# Patient Record
Sex: Female | Born: 1950 | Race: White | Hispanic: No | Smoking: Never smoker
Health system: Southern US, Community
[De-identification: ages and names within clinical notes are randomized; demographics above are authoritative.]

## PROBLEM LIST (undated history)

## (undated) DIAGNOSIS — T7840XA Allergy, unspecified, initial encounter: Secondary | ICD-10-CM

## (undated) DIAGNOSIS — K5792 Diverticulitis of intestine, part unspecified, without perforation or abscess without bleeding: Secondary | ICD-10-CM

## (undated) DIAGNOSIS — M1990 Unspecified osteoarthritis, unspecified site: Secondary | ICD-10-CM

## (undated) HISTORY — PX: NASAL SINUS SURGERY: SHX719

## (undated) HISTORY — PX: UPPER GASTROINTESTINAL ENDOSCOPY: SHX188

## (undated) HISTORY — DX: Diverticulitis of intestine, part unspecified, without perforation or abscess without bleeding: K57.92

## (undated) HISTORY — PX: COLONOSCOPY: SHX5424

## (undated) HISTORY — DX: Unspecified osteoarthritis, unspecified site: M19.90

## (undated) HISTORY — DX: Allergy, unspecified, initial encounter: T78.40XA

## (undated) HISTORY — PX: CHOLECYSTECTOMY: SHX55

---

## 1999-06-16 ENCOUNTER — Other Ambulatory Visit: Admission: RE | Admit: 1999-06-16 | Discharge: 1999-06-16 | Payer: Self-pay | Admitting: *Deleted

## 1999-06-19 ENCOUNTER — Other Ambulatory Visit: Admission: RE | Admit: 1999-06-19 | Discharge: 1999-06-19 | Payer: Self-pay | Admitting: *Deleted

## 1999-07-18 ENCOUNTER — Encounter: Admission: RE | Admit: 1999-07-18 | Discharge: 1999-07-18 | Payer: Self-pay | Admitting: *Deleted

## 2012-02-29 DIAGNOSIS — C4491 Basal cell carcinoma of skin, unspecified: Secondary | ICD-10-CM

## 2012-02-29 HISTORY — DX: Basal cell carcinoma of skin, unspecified: C44.91

## 2013-03-13 DIAGNOSIS — IMO0002 Reserved for concepts with insufficient information to code with codable children: Secondary | ICD-10-CM | POA: Insufficient documentation

## 2013-03-13 DIAGNOSIS — N952 Postmenopausal atrophic vaginitis: Secondary | ICD-10-CM

## 2013-03-13 HISTORY — DX: Postmenopausal atrophic vaginitis: N95.2

## 2015-10-29 DIAGNOSIS — K219 Gastro-esophageal reflux disease without esophagitis: Secondary | ICD-10-CM | POA: Insufficient documentation

## 2016-11-12 ENCOUNTER — Encounter: Payer: Self-pay | Admitting: Family Medicine

## 2017-11-16 DIAGNOSIS — F5101 Primary insomnia: Secondary | ICD-10-CM

## 2017-11-16 HISTORY — DX: Primary insomnia: F51.01

## 2018-01-17 ENCOUNTER — Encounter: Payer: Self-pay | Admitting: Family Medicine

## 2018-04-13 DIAGNOSIS — F411 Generalized anxiety disorder: Secondary | ICD-10-CM | POA: Insufficient documentation

## 2018-04-13 DIAGNOSIS — E782 Mixed hyperlipidemia: Secondary | ICD-10-CM | POA: Insufficient documentation

## 2018-11-22 LAB — HEPATIC FUNCTION PANEL
ALT: 29 (ref 7–35)
AST: 28 (ref 13–35)
Alkaline Phosphatase: 81 (ref 25–125)
Bilirubin, Total: 0.6

## 2018-11-22 LAB — LIPID PANEL
Cholesterol: 205 — AB (ref 0–200)
HDL: 74 — AB (ref 35–70)
LDL Cholesterol: 98
Triglycerides: 166 — AB (ref 40–160)

## 2018-11-22 LAB — CBC AND DIFFERENTIAL
HCT: 42 (ref 36–46)
Hemoglobin: 14.7 (ref 12.0–16.0)
Neutrophils Absolute: 5
Platelets: 309 (ref 150–399)
WBC: 7.5

## 2018-11-22 LAB — HEMOGLOBIN A1C: Hemoglobin A1C: 5.6

## 2018-11-22 LAB — BASIC METABOLIC PANEL
BUN: 15 (ref 4–21)
Creatinine: 0.6 (ref 0.5–1.1)
Glucose: 98
Potassium: 4.2 (ref 3.4–5.3)
Sodium: 140 (ref 137–147)

## 2019-03-29 ENCOUNTER — Other Ambulatory Visit: Payer: Self-pay

## 2019-03-29 ENCOUNTER — Ambulatory Visit (INDEPENDENT_AMBULATORY_CARE_PROVIDER_SITE_OTHER): Payer: Medicare Other | Admitting: Family Medicine

## 2019-03-29 ENCOUNTER — Encounter: Payer: Self-pay | Admitting: Family Medicine

## 2019-03-29 VITALS — BP 118/68 | HR 66 | Temp 98.4°F | Resp 14 | Ht 65.0 in | Wt 134.4 lb

## 2019-03-29 DIAGNOSIS — H04123 Dry eye syndrome of bilateral lacrimal glands: Secondary | ICD-10-CM

## 2019-03-29 DIAGNOSIS — I10 Essential (primary) hypertension: Secondary | ICD-10-CM | POA: Insufficient documentation

## 2019-03-29 DIAGNOSIS — J4520 Mild intermittent asthma, uncomplicated: Secondary | ICD-10-CM

## 2019-03-29 DIAGNOSIS — H04129 Dry eye syndrome of unspecified lacrimal gland: Secondary | ICD-10-CM | POA: Insufficient documentation

## 2019-03-29 DIAGNOSIS — B353 Tinea pedis: Secondary | ICD-10-CM

## 2019-03-29 DIAGNOSIS — J301 Allergic rhinitis due to pollen: Secondary | ICD-10-CM

## 2019-03-29 DIAGNOSIS — Z8249 Family history of ischemic heart disease and other diseases of the circulatory system: Secondary | ICD-10-CM | POA: Diagnosis not present

## 2019-03-29 DIAGNOSIS — L301 Dyshidrosis [pompholyx]: Secondary | ICD-10-CM

## 2019-03-29 DIAGNOSIS — K219 Gastro-esophageal reflux disease without esophagitis: Secondary | ICD-10-CM

## 2019-03-29 HISTORY — DX: Gastro-esophageal reflux disease without esophagitis: K21.9

## 2019-03-29 HISTORY — DX: Allergic rhinitis due to pollen: J30.1

## 2019-03-29 HISTORY — DX: Essential (primary) hypertension: I10

## 2019-03-29 HISTORY — DX: Mild intermittent asthma, uncomplicated: J45.20

## 2019-03-29 MED ORDER — TRIAMCINOLONE ACETONIDE 0.1 % EX CREA: 1.0000 "application " | TOPICAL_CREAM | Freq: Two times a day (BID) | CUTANEOUS | 0 refills | Status: DC

## 2019-03-29 NOTE — Patient Instructions (Signed)
Please return in February 2021 for your annual complete physical; please come fasting.  Please have your GYN send me the results of your upcoming mammo and Bone Density tests.   Try the steroid cream twice a day for your hands. Use OTC Lamisil for your feet.   It was a pleasure meeting you today! Thank you for choosing [place] to meet your healthcare needs! I truly look forward to working with you. If you have any questions or concerns, please send me a message via Mychart or call the office at [phone].   Calcium Intake Recommendations You can take Caltrate Plus twice a day or get it through your diet or other OTC supplements (Viactiv, OsCal etc)  Calcium is a mineral that affects many functions in the body, including:  Blood clotting.  Blood vessel function.  Nerve impulse conduction.  Hormone secretion.  Muscle contraction.  Bone and teeth functions.  Most of your body's calcium supply is stored in your bones and teeth. When your calcium stores are low, you may be at risk for low bone mass, bone loss, and bone fractures. Consuming enough calcium helps to grow healthy bones and teeth and to prevent breakdown over time. It is very important that you get enough calcium if you are:  A child undergoing rapid growth.  An adolescent girl.  A pre- or post-menopausal woman.  A woman whose menstrual cycle has stopped due to anorexia nervosa or regular intense exercise.  An individual with lactose intolerance or a milk allergy.  A vegetarian.  What is my plan? Try to consume the recommended amount of calcium daily based on your age. Depending on your overall health, your health care provider may recommend increased calcium intake.General daily calcium intake recommendations by age are:  Birth to 6 months: 200 mg.  Infants 7 to 12 months: 260 mg.  Children 1 to 3 years: 700 mg.  Children 4 to 8 years: 1,000 mg.  Children 9 to 13 years: 1,300 mg.  Teens 14 to 18 years:  1,300 mg.  Adults 19 to 50 years: 1,000 mg.  Adult women 51 to 70 years: 1,200 mg.  Adult men 51 to 70 years: 1,000 mg.  Adults 71 years and older: 1,200 mg.  Pregnant and breastfeeding teens: 1,300 mg.  Pregnant and breastfeeding adults: 1,000 mg.  What do I need to know about calcium intake?  In order for the body to absorb calcium, it needs vitamin D. You can get vitamin D through (we recommend getting [phone] units of Vitamin D daily) ? Direct exposure of the skin to sunlight. ? Foods, such as egg yolks, liver, saltwater fish, and fortified milk. ? Supplements.  Consuming too much calcium may cause: ? Constipation. ? Decreased absorption of iron and zinc. ? Kidney stones.  Calcium supplements may interact with certain medicines. Check with your health care provider before starting any calcium supplements.  Try to get most of your calcium from food. What foods can I eat? Grains  Fortified oatmeal. Fortified ready-to-eat cereals. Fortified frozen waffles. Vegetables Turnip greens. Broccoli. Fruits Fortified orange juice. Meats and Other Protein Sources Canned sardines with bones. Canned salmon with bones. Soy beans. Tofu. Baked beans. Almonds. [place] nuts. Sunflower seeds. Dairy Milk. Yogurt. Cheese. Cottage cheese. Beverages Fortified soy milk. Fortified rice milk. Sweets/Desserts Pudding. Ice Cream. Milkshakes. Blackstrap molasses. The items listed above may not be a complete list of recommended foods or beverages. Contact your dietitian for more options. What foods can affect my calcium intake?  It may be more difficult for your body to use calcium or calcium may leave your body more quickly if you consume large amounts of:  Sodium.  Protein.  Caffeine.  Alcohol.  This information is not intended to replace advice given to you by your health care provider. Make sure you discuss any questions you have with your health care provider. Document Released:  04/28/2004 Document Revised: 04/03/2016 Document Reviewed: 02/20/2014 Elsevier Interactive Patient Education  2018 [employer].

## 2019-03-29 NOTE — Progress Notes (Signed)
Subjective  CC:  Chief Complaint  Patient presents with  . Establish Care    Recent move to the area, previous PCP was Dr. Leonides Schanz had CPE 10/2018  . Recurrent Skin Infections    Left foot.. Noticed in March gotten worse over last month. Has not tried anything    HPI: Tina Kelley is a 68 y.o. female who presents to [facility] today to establish care with me as a new patient.   She has the following concerns or needs:  Very pleasant 68 year old married female recently relocated to [place] from [place].  She is a retired Radio producer.  Her husband is retiring as well and he moved to be closer to their children.  They live in [place] in the past.  Overall, she is very healthy.  She has mild hypertension is well controlled on low-dose medications.  Mild intermittent asthma that is uncomplicated, seasonal allergies and GERD symptoms.  She had a recent physical in February and reports her blood work was normal at that time.  She lives a healthy lifestyle.  Overall she feels well.  Immunizations are up-to-date.  Health maintenance: She is due for mammogram and bone density.  She does have history of osteopenia by chart review.  She gets this done at her gynecologist and will be seeing her in July.  Colonoscopy was normal when last done.  It is up-to-date.  Rashes: She reports that she has some peeling on her hands and left fourth and fifth toe.  Toes have mild redness and itching.  She did get pedicures in the past.  No toenail problems.  Hands with mild redness and flaking worse after frequent handwashing due to the cold epidemic.  Assessment  1. Family history of premature CAD   2. Essential hypertension   3. Seasonal allergic rhinitis due to pollen   4. Mild intermittent asthma without complication   5. Dry eye syndrome of both eyes   6. Gastroesophageal reflux disease without esophagitis   7. Dyshidrotic eczema   8. Tinea pedis,  unspecified laterality      Plan   Multiple chronic problems are mild and well-controlled.  No changes in medications made today for those.  Dyshidrotic eczema and presumed tinea pedis: Educated on diagnosis and treatment options.  Triamcinolone for hands and Lamisil over-the-counter for feet.  Follow-up if not improving.  Health maintenance: She will get her mammogram and bone density at her gynecologist, I have requested results.  Continue calcium and vitamin D and active lifestyle.  Return in 7 months for complete physical.  Hypertension is well controlled.  Follow up:  Return in about 7 months (around 10/30/2019) for complete physical. No orders of the defined types were placed in this encounter.  Meds ordered this encounter  Medications  . triamcinolone cream (KENALOG) 0.1 %    Sig: Apply 1 application topically 2 (two) times daily. For 2 weeks, then as needed    Dispense:  28.4 g    Refill:  0     Depression screen PHQ 2/9 03/29/2019  Decreased Interest 1  Down, Depressed, Hopeless 1  PHQ - 2 Score 2  Altered sleeping 0  Tired, decreased energy 1  Change in appetite 0  Feeling bad or failure about yourself  0  Trouble concentrating 0  Moving slowly or fidgety/restless 0  Suicidal thoughts 0  PHQ-9 Score 3  Difficult doing work/chores Not difficult at all    We updated and  reviewed the patient's past history in detail and it is documented below.  Patient Active Problem List   Diagnosis Date Noted  . Essential hypertension 03/29/2019  . Seasonal allergic rhinitis due to pollen 03/29/2019  . Mild intermittent asthma without complication 82/50/5397  . Dry eye syndrome 03/29/2019  . GERD (gastroesophageal reflux disease) 03/29/2019    Chronic, normal EGD. Chronic PPI   . Family history of premature CAD 03/29/2019  . GAD (generalized anxiety disorder) 04/13/2018  . Mixed hyperlipidemia 04/13/2018  . Primary insomnia 11/16/2017  . Postmenopausal atrophic vaginitis  03/13/2013  . Dyspareunia 03/13/2013  . Basal cell carcinoma of skin 02/29/2012    basal cell carcinoma on her left melolabial fold approximately 30 years  ago    Health Maintenance  Topic Date Due  . Hepatitis C Screening  09-04-1951  . MAMMOGRAM  10/13/1967  . TETANUS/TDAP  10/12/1968  . COLONOSCOPY  10/13/1999  . PNA vac Low Risk Adult (1 of 2 - PCV13) 10/12/2014  . DEXA SCAN  02/27/2019  . INFLUENZA VACCINE  04/29/2019   Immunization History  Administered Date(s) Administered  . Pneumococcal Conjugate-13 10/24/2014  . Pneumococcal Polysaccharide-23 10/29/2015  . Tdap 01/08/2011   Current Meds  Medication Sig  . albuterol (VENTOLIN HFA) 108 (90 Base) MCG/ACT inhaler   . buPROPion (WELLBUTRIN SR) 150 MG 12 hr tablet   . conjugated estrogens (PREMARIN) vaginal cream Place vaginally.  . cycloSPORINE, PF, (CEQUA) 0.09 % SOLN Apply to eye. Both eye BID  . esomeprazole (NEXIUM) 20 MG capsule Take 20 mg by mouth daily at 12 noon.  . fluticasone (FLONASE) 50 MCG/ACT nasal spray Place into the nose.  . Lactobacillus Rhamnosus, GG, (CULTURELLE) CAPS Take by mouth.  . losartan (COZAAR) 50 MG tablet 50 mg.   . metoprolol tartrate (LOPRESSOR) 50 MG tablet Take by mouth.  . prednisoLONE Acetate-Nepafenac 1-0.1 % SUSP Apply to eye. Both eyes  . PRESCRIPTION MEDICATION Cholestyramine for Oral Suspension  . rosuvastatin (CRESTOR) 10 MG tablet Take by mouth.  . zolpidem (AMBIEN CR) 12.5 MG CR tablet Take by mouth.    Allergies: Patient is allergic to aspirin; nsaids; penicillins; and lisinopril. Past Medical History Patient  has a past medical history of Basal cell carcinoma of skin (02/29/2012), Essential hypertension (03/29/2019), GERD (gastroesophageal reflux disease) (03/29/2019), Mild intermittent asthma without complication (03/04/3418), Postmenopausal atrophic vaginitis (03/13/2013), Primary insomnia (11/16/2017), and Seasonal allergic rhinitis due to pollen (03/29/2019). Past Surgical  History Patient  has no past surgical history on file. Family History: Patient family history includes Healthy in her son and son; Heart disease in her father; Lung cancer in her mother. Social History:  Patient  reports that she has never smoked. She has never used smokeless tobacco. She reports current alcohol use. She reports that she does not use drugs.  Review of Systems: Constitutional: negative for fever or malaise Ophthalmic: negative for photophobia, double vision or loss of vision Cardiovascular: negative for chest pain, dyspnea on exertion, or new LE swelling Respiratory: negative for SOB or persistent cough Gastrointestinal: negative for abdominal pain, change in bowel habits or melena Genitourinary: negative for dysuria or gross hematuria Musculoskeletal: negative for new gait disturbance or muscular weakness Integumentary: negative for new or persistent rashes Neurological: negative for TIA or stroke symptoms Psychiatric: negative for SI or delusions Allergic/Immunologic: negative for hives  Patient Care Team    Relationship Specialty Notifications Start End  Leamon Arnt, MD PCP - General Family Medicine  03/29/19   Lenon Oms, MD  Consulting Physician Obstetrics and Gynecology  03/29/19     Objective  Vitals: BP 118/68   Pulse 66   Temp 98.4 F (36.9 C) (Oral)   Resp 14   Ht 5\' 5"  (1.651 m)   Wt 134 lb 6.4 oz (61 kg)   SpO2 97%   BMI 22.37 kg/m  General:  Well developed, well nourished, no acute distress  Psych:  Alert and oriented,normal mood and affect HEENT:  Normocephalic, atraumatic, non-icteric sclera, PERRL, oropharynx is without mass or exudate, supple neck without adenopathy, mass or thyromegaly Cardiovascular:  RRR without gallop, rub or murmur, nondisplaced PMI Respiratory:  Good breath sounds bilaterally, CTAB with normal respiratory effort Gastrointestinal: normal bowel sounds, soft, non-tender, no noted masses. No HSM MSK: no deformities,  contusions. Joints are without erythema or swelling Skin:  Warm, left fourth and fifth webspaces of her toes with flaking rash without erythema, hands with mild erythema, flaking and papules. Neurologic:    Mental status is normal. Gross motor and sensory exams are normal. Normal gait   Commons side effects, risks, benefits, and alternatives for medications and treatment plan prescribed today were discussed, and the patient expressed understanding of the given instructions. Patient is instructed to call or message via MyChart if he/she has any questions or concerns regarding our treatment plan. No barriers to understanding were identified. We discussed Red Flag symptoms and signs in detail. Patient expressed understanding regarding what to do in case of urgent or emergency type symptoms.   Medication list was reconciled, printed and provided to the patient in AVS. Patient instructions and summary information was reviewed with the patient as documented in the AVS. This note was prepared with assistance of Dragon voice recognition software. Occasional wrong-word or sound-a-like substitutions may have occurred due to the inherent limitations of voice recognition software

## 2019-04-07 ENCOUNTER — Encounter: Payer: Self-pay | Admitting: *Deleted

## 2019-04-14 ENCOUNTER — Encounter: Payer: Self-pay | Admitting: Family Medicine

## 2019-05-04 ENCOUNTER — Ambulatory Visit (INDEPENDENT_AMBULATORY_CARE_PROVIDER_SITE_OTHER): Payer: Medicare Other

## 2019-05-04 ENCOUNTER — Ambulatory Visit (INDEPENDENT_AMBULATORY_CARE_PROVIDER_SITE_OTHER): Payer: Medicare Other | Admitting: Podiatry

## 2019-05-04 ENCOUNTER — Other Ambulatory Visit: Payer: Self-pay

## 2019-05-04 DIAGNOSIS — M2011 Hallux valgus (acquired), right foot: Secondary | ICD-10-CM

## 2019-05-04 DIAGNOSIS — M21621 Bunionette of right foot: Secondary | ICD-10-CM

## 2019-05-04 NOTE — Patient Instructions (Signed)
Bunion  A bunion is a bump on the base of the big toe that forms when the bones of the big toe joint move out of position. Bunions may be small at first, but they often get larger over time. They can make walking painful. What are the causes? A bunion may be caused by:  Wearing narrow or pointed shoes that force the big toe to press against the other toes.  Abnormal foot development that causes the foot to roll inward (pronate).  Changes in the foot that are caused by certain diseases, such as rheumatoid arthritis or polio.  A foot injury. What increases the risk? The following factors may make you more likely to develop this condition:  Wearing shoes that squeeze the toes together.  Having certain diseases, such as: ? Rheumatoid arthritis. ? Polio. ? Cerebral palsy.  Having family members who have bunions.  Being born with a foot deformity, such as flat feet or low arches.  Doing activities that put a lot of pressure on the feet, such as ballet dancing. What are the signs or symptoms? The main symptom of a bunion is a noticeable bump on the big toe. Other symptoms may include:  Pain.  Swelling around the big toe.  Redness and inflammation.  Thick or hardened skin on the big toe or between the toes.  Stiffness or loss of motion in the big toe.  Trouble with walking. How is this diagnosed? A bunion may be diagnosed based on your symptoms, medical history, and activities. You may have tests, such as:  X-rays. These allow your health care provider to check the position of the bones in your foot and look for damage to your joint. They also help your health care provider determine the severity of your bunion and the best way to treat it.  Joint aspiration. In this test, a sample of fluid is removed from the toe joint. This test may be done if you are in a lot of pain. It helps rule out diseases that cause painful swelling of the joints, such as arthritis. How is this  treated? Treatment depends on the severity of your symptoms. The goal of treatment is to relieve symptoms and prevent the bunion from getting worse. Your health care provider may recommend:  Wearing shoes that have a wide toe box.  Using bunion pads to cushion the affected area.  Taping your toes together to keep them in a normal position.  Placing a device inside your shoe (orthotics) to help reduce pressure on your toe joint.  Taking medicine to ease pain, inflammation, and swelling.  Applying heat or ice to the affected area.  Doing stretching exercises.  Surgery to remove scar tissue and move the toes back into their normal position. This treatment is rare. Follow these instructions at home: Managing pain, stiffness, and swelling   If directed, put ice on the painful area: ? Put ice in a plastic bag. ? Place a towel between your skin and the bag. ? Leave the ice on for 20 minutes, 2-3 times a day. Activity   If directed, apply heat to the affected area before you exercise. Use the heat source that your health care provider recommends, such as a moist heat pack or a heating pad. ? Place a towel between your skin and the heat source. ? Leave the heat on for 20-30 minutes. ? Remove the heat if your skin turns bright red. This is especially important if you are unable to feel pain,   heat, or cold. You may have a greater risk of getting burned.  Do exercises as told by your health care provider. General instructions  Support your toe joint with proper footwear, shoe padding, or taping as told by your health care provider.  Take over-the-counter and prescription medicines only as told by your health care provider.  Keep all follow-up visits as told by your health care provider. This is important. Contact a health care provider if your symptoms:  Get worse.  Do not improve in 2 weeks. Get help right away if you have:  Severe pain and trouble with walking. Summary  A  bunion is a bump on the base of the big toe that forms when the bones of the big toe joint move out of position.  Bunions can make walking painful.  Treatment depends on the severity of your symptoms.  Support your toe joint with proper footwear, shoe padding, or taping as told by your health care provider. This information is not intended to replace advice given to you by your health care provider. Make sure you discuss any questions you have with your health care provider. Document Released: 09/14/2005 Document Revised: 03/21/2018 Document Reviewed: 01/25/2018 Elsevier Patient Education  2020 [employer]  

## 2019-05-11 ENCOUNTER — Encounter: Payer: Self-pay | Admitting: Family Medicine

## 2019-05-15 NOTE — Progress Notes (Signed)
Subjective:   Patient ID: Tina Kelley, female   DOB: 68 y.o.   MRN: [id number]   HPI 68 year old female presents the office today for concerns of a bunion on the right foot.  She said that she had seen another doctor for this previously about 2 years ago however she did move to [place].  She started developed calluses underneath the bunion areas.  They do hurt with shoes.  She has tried changing shoes as well as wearing shoes with arch support as she is a history of plantar fasciitis about 15 years ago.  She denies any recent injury to her feet.   Review of Systems  All other systems reviewed and are negative.  Past Medical History:  Diagnosis Date  . Basal cell carcinoma of skin 02/29/2012   basal cell carcinoma on her left melolabial fold approximately 30 years  ago  . Essential hypertension 03/29/2019  . GERD (gastroesophageal reflux disease) 03/29/2019   Chronic, normal EGD. Chronic PPI  . Mild intermittent asthma without complication 05/31/2354  . Postmenopausal atrophic vaginitis 03/13/2013  . Primary insomnia 11/16/2017  . Seasonal allergic rhinitis due to pollen 03/29/2019    No past surgical history on file.   Current Outpatient Medications:  .  albuterol (VENTOLIN HFA) 108 (90 Base) MCG/ACT inhaler, , Disp: , Rfl:  .  buPROPion (WELLBUTRIN SR) 150 MG 12 hr tablet, , Disp: , Rfl:  .  conjugated estrogens (PREMARIN) vaginal cream, Place vaginally., Disp: , Rfl:  .  cycloSPORINE, PF, (CEQUA) 0.09 % SOLN, Apply to eye. Both eye BID, Disp: , Rfl:  .  esomeprazole (NEXIUM) 20 MG capsule, Take 20 mg by mouth daily at 12 noon., Disp: , Rfl:  .  fluticasone (FLONASE) 50 MCG/ACT nasal spray, Place into the nose., Disp: , Rfl:  .  Lactobacillus Rhamnosus, GG, (CULTURELLE) CAPS, Take by mouth., Disp: , Rfl:  .  losartan (COZAAR) 50 MG tablet, 50 mg. , Disp: , Rfl:  .  metoprolol tartrate (LOPRESSOR) 50 MG tablet, Take by mouth., Disp: , Rfl:  .  prednisoLONE Acetate-Nepafenac 1-0.1 % SUSP,  Apply to eye. Both eyes, Disp: , Rfl:  .  PRESCRIPTION MEDICATION, Cholestyramine for Oral Suspension, Disp: , Rfl:  .  rosuvastatin (CRESTOR) 10 MG tablet, Take by mouth., Disp: , Rfl:  .  triamcinolone cream (KENALOG) 0.1 %, Apply 1 application topically 2 (two) times daily. For 2 weeks, then as needed, Disp: 28.4 g, Rfl: 0 .  zolpidem (AMBIEN CR) 12.5 MG CR tablet, Take by mouth., Disp: , Rfl:   Allergies  Allergen Reactions  . Aspirin Other (See Comments)    Asthma  . Nsaids Shortness Of Breath  . Penicillins Other (See Comments)    Told as a child  . Lisinopril Cough         Objective:  Physical Exam  General: AAO x3, NAD  Dermatological: Mild hyperkeratotic lesions submetatarsal 1 area.  No ulcerations identified.  Vascular: Dorsalis Pedis artery and Posterior Tibial artery pedal pulses are 2/4 bilateral with immedate capillary fill time. Pedal hair growth present. No varicosities and no lower extremity edema present bilateral. There is no pain with calf compression, swelling, warmth, erythema.   Neruologic: Grossly intact via light touch bilateral. Protective threshold with Semmes Wienstein monofilament intact to all pedal sites bilateral.   Musculoskeletal: Moderate bunion as well as tailor's bunion deformities present there is tenderness palpation suggested on this area.  Minimal erythema from rubbing said she can.  No skin breakdown.  No crepitation with MPJ range of motion first ray hypermobility present.  Muscular strength 5/5 in all groups tested bilateral.  Gait: Unassisted, Nonantalgic.       Assessment:   Right foot bunion, tailor's bunion deformity     Plan:  -Treatment options discussed including all alternatives, risks, and complications -Etiology of symptoms were discussed -X-rays were obtained and reviewed with the patient.  Moderate bunion as well as tailor bunion deformity present.  No evidence of acute fracture. -We discussed both conservative as  well as surgical treatment options.  For now she does not think about her options and aggressive surgery does not continue with conservative care for now.  Discussed shoe modifications including right a wider toe box.  Discussed wearing good arch supports, offloading and padding.  Steroid injection if needed.  Trula Slade DPM

## 2019-06-06 ENCOUNTER — Telehealth: Payer: Self-pay | Admitting: Family Medicine

## 2019-06-06 ENCOUNTER — Other Ambulatory Visit: Payer: Self-pay | Admitting: *Deleted

## 2019-06-06 NOTE — Telephone Encounter (Signed)
Called pt she reports she is not needing a dermatologist at the moment. She does report seeing one back in July and would like one in [place] when the referral is needed. She will call the office back if/when needed

## 2019-06-06 NOTE — Telephone Encounter (Signed)
See note  Copied from [place] [id number]. Topic: General - Other >> Jun 06, 2019 11:58 AM Pauline Good wrote: Reason for CRM: pt is being referred to Dermatology and they want to know which office to refer pt to that you would recommend. Please call to advise because they aren't familiar with the offices here.

## 2019-06-19 ENCOUNTER — Other Ambulatory Visit: Payer: Self-pay

## 2019-06-19 ENCOUNTER — Ambulatory Visit (INDEPENDENT_AMBULATORY_CARE_PROVIDER_SITE_OTHER): Payer: Medicare Other

## 2019-06-19 ENCOUNTER — Encounter: Payer: Self-pay | Admitting: Family Medicine

## 2019-06-19 DIAGNOSIS — Z23 Encounter for immunization: Secondary | ICD-10-CM

## 2019-08-07 ENCOUNTER — Ambulatory Visit (INDEPENDENT_AMBULATORY_CARE_PROVIDER_SITE_OTHER): Payer: Medicare Other

## 2019-08-07 ENCOUNTER — Other Ambulatory Visit: Payer: Self-pay

## 2019-08-07 VITALS — BP 110/68 | Temp 97.6°F | Ht 65.0 in | Wt 137.6 lb

## 2019-08-07 DIAGNOSIS — Z0000 Encounter for general adult medical examination without abnormal findings: Secondary | ICD-10-CM | POA: Diagnosis not present

## 2019-08-07 DIAGNOSIS — Z1211 Encounter for screening for malignant neoplasm of colon: Secondary | ICD-10-CM

## 2019-08-07 NOTE — Patient Instructions (Addendum)
Tina Kelley , Thank you for taking time to come for your Medicare Wellness Visit. I appreciate your ongoing commitment to your health goals. Please review the following plan we discussed and let me know if I can assist you in the future.   Screening recommendations/referrals: Colorectal Screening: up to date; last 04/06/10 Mammogram: up to date; we will request records  Bone Density: up to date; we will request records   Vision and Dental Exams: Recommended annual ophthalmology exams for early detection of glaucoma and other disorders of the eye Recommended annual dental exams for proper oral hygiene  Vaccinations: Influenza vaccine: completed 06/19/19 Pneumococcal vaccine: up to date; last 10/29/15 Tdap vaccine: up to date; last 01/08/11  Shingles vaccine: Please call your insurance company to determine your out of pocket expense for the Shingrix vaccine. You may receive this vaccine at your local pharmacy.  Advanced directives: Please bring a copy of your POA (Power of Attorney) and/or Living Will to your next appointment.  Goals: Recommend to drink at least 6-8 8oz glasses of water per day and consume a balanced diet rich in fresh fruits and vegetables.   Next appointment: Please schedule your Annual Wellness Visit with your Nurse Health Advisor in one year.  Preventive Care 69 Years and Older, Female Preventive care refers to lifestyle choices and visits with your health care provider that can promote health and wellness. What does preventive care include?  A yearly physical exam. This is also called an annual well check.  Dental exams once or twice a year.  Routine eye exams. Ask your health care provider how often you should have your eyes checked.  Personal lifestyle choices, including:  Daily care of your teeth and gums.  Regular physical activity.  Eating a healthy diet.  Avoiding tobacco and drug use.  Limiting alcohol use.  Practicing safe sex.  Taking low-dose  aspirin every day if recommended by your health care provider.  Taking vitamin and mineral supplements as recommended by your health care provider. What happens during an annual well check? The services and screenings done by your health care provider during your annual well check will depend on your age, overall health, lifestyle risk factors, and family history of disease. Counseling  Your health care provider may ask you questions about your:  Alcohol use.  Tobacco use.  Drug use.  Emotional well-being.  Home and relationship well-being.  Sexual activity.  Eating habits.  History of falls.  Memory and ability to understand (cognition).  Work and work Statistician.  Reproductive health. Screening  You may have the following tests or measurements:  Height, weight, and BMI.  Blood pressure.  Lipid and cholesterol levels. These may be checked every 5 years, or more frequently if you are over 72 years old.  Skin check.  Lung cancer screening. You may have this screening every year starting at age 69 if you have a 30-pack-year history of smoking and currently smoke or have quit within the past 15 years.  Fecal occult blood test (FOBT) of the stool. You may have this test every year starting at age 69.  Flexible sigmoidoscopy or colonoscopy. You may have a sigmoidoscopy every 5 years or a colonoscopy every 10 years starting at age 69.  Hepatitis C blood test.  Hepatitis B blood test.  Sexually transmitted disease (STD) testing.  Diabetes screening. This is done by checking your blood sugar (glucose) after you have not eaten for a while (fasting). You may have this done every 1-3  years.  Bone density scan. This is done to screen for osteoporosis. You may have this done starting at age 69.  Mammogram. This may be done every 1-2 years. Talk to your health care provider about how often you should have regular mammograms. Talk with your health care provider about your  test results, treatment options, and if necessary, the need for more tests. Vaccines  Your health care provider may recommend certain vaccines, such as:  Influenza vaccine. This is recommended every year.  Tetanus, diphtheria, and acellular pertussis (Tdap, Td) vaccine. You may need a Td booster every 10 years.  Zoster vaccine. You may need this after age 69.  Pneumococcal 13-valent conjugate (PCV13) vaccine. One dose is recommended after age 55.  Pneumococcal polysaccharide (PPSV23) vaccine. One dose is recommended after age 69. Talk to your health care provider about which screenings and vaccines you need and how often you need them. This information is not intended to replace advice given to you by your health care provider. Make sure you discuss any questions you have with your health care provider. Document Released: 10/11/2015 Document Revised: 06/03/2016 Document Reviewed: 07/16/2015 Elsevier Interactive Patient Education  2017 [place] Prevention in the Home Falls can cause injuries. They can happen to people of all ages. There are many things you can do to make your home safe and to help prevent falls. What can I do on the outside of my home?  Regularly fix the edges of walkways and driveways and fix any cracks.  Remove anything that might make you trip as you walk through a door, such as a raised step or threshold.  Trim any bushes or trees on the path to your home.  Use bright outdoor lighting.  Clear any walking paths of anything that might make someone trip, such as rocks or tools.  Regularly check to see if handrails are loose or broken. Make sure that both sides of any steps have handrails.  Any raised decks and porches should have guardrails on the edges.  Have any leaves, snow, or ice cleared regularly.  Use sand or salt on walking paths during winter.  Clean up any spills in your garage right away. This includes oil or grease spills. What can I  do in the bathroom?  Use night lights.  Install grab bars by the toilet and in the tub and shower. Do not use towel bars as grab bars.  Use non-skid mats or decals in the tub or shower.  If you need to sit down in the shower, use a plastic, non-slip stool.  Keep the floor dry. Clean up any water that spills on the floor as soon as it happens.  Remove soap buildup in the tub or shower regularly.  Attach bath mats securely with double-sided non-slip rug tape.  Do not have throw rugs and other things on the floor that can make you trip. What can I do in the bedroom?  Use night lights.  Make sure that you have a light by your bed that is easy to reach.  Do not use any sheets or blankets that are too big for your bed. They should not hang down onto the floor.  Have a firm chair that has side arms. You can use this for support while you get dressed.  Do not have throw rugs and other things on the floor that can make you trip. What can I do in the kitchen?  Clean up any spills right away.  Avoid walking on wet floors.  Keep items that you use a lot in easy-to-reach places.  If you need to reach something above you, use a strong step stool that has a grab bar.  Keep electrical cords out of the way.  Do not use floor polish or wax that makes floors slippery. If you must use wax, use non-skid floor wax.  Do not have throw rugs and other things on the floor that can make you trip. What can I do with my stairs?  Do not leave any items on the stairs.  Make sure that there are handrails on both sides of the stairs and use them. Fix handrails that are broken or loose. Make sure that handrails are as long as the stairways.  Check any carpeting to make sure that it is firmly attached to the stairs. Fix any carpet that is loose or worn.  Avoid having throw rugs at the top or bottom of the stairs. If you do have throw rugs, attach them to the floor with carpet tape.  Make sure that  you have a light switch at the top of the stairs and the bottom of the stairs. If you do not have them, ask someone to add them for you. What else can I do to help prevent falls?  Wear shoes that:  Do not have high heels.  Have rubber bottoms.  Are comfortable and fit you well.  Are closed at the toe. Do not wear sandals.  If you use a stepladder:  Make sure that it is fully opened. Do not climb a closed stepladder.  Make sure that both sides of the stepladder are locked into place.  Ask someone to hold it for you, if possible.  Clearly mark and make sure that you can see:  Any grab bars or handrails.  First and last steps.  Where the edge of each step is.  Use tools that help you move around (mobility aids) if they are needed. These include:  Canes.  Walkers.  Scooters.  Crutches.  Turn on the lights when you go into a dark area. Replace any light bulbs as soon as they burn out.  Set up your furniture so you have a clear path. Avoid moving your furniture around.  If any of your floors are uneven, fix them.  If there are any pets around you, be aware of where they are.  Review your medicines with your doctor. Some medicines can make you feel dizzy. This can increase your chance of falling. Ask your doctor what other things that you can do to help prevent falls. This information is not intended to replace advice given to you by your health care provider. Make sure you discuss any questions you have with your health care provider. Document Released: 07/11/2009 Document Revised: 02/20/2016 Document Reviewed: 10/19/2014 Elsevier Interactive Patient Education  2017 [employer].

## 2019-08-07 NOTE — Progress Notes (Addendum)
Subjective:   Tina Kelley is a 68 y.o. female who presents for an Subsequent Medicare Annual Wellness Visit.  Review of Systems     Cardiac Risk Factors include: advanced age (>64men, >20 women);dyslipidemia;hypertension    Objective:    Today's Vitals   08/07/19 1136  BP: 110/68  Temp: 97.6 F (36.4 C)  Weight: 137 lb 9.6 oz (62.4 kg)  Height: 5\' 5"  (1.651 m)   Body mass index is 22.9 kg/m.  Advanced Directives 08/07/2019  Does Patient Have a Medical Advance Directive? Yes  Type of Advance Directive Living will;Healthcare Power of Attorney  Does patient want to make changes to medical advance directive? No - Patient declined  Copy of [place] in Chart? No - copy requested    Current Medications (verified) Outpatient Encounter Medications as of 08/07/2019  Medication Sig  . albuterol (VENTOLIN HFA) 108 (90 Base) MCG/ACT inhaler   . buPROPion (WELLBUTRIN SR) 150 MG 12 hr tablet   . conjugated estrogens (PREMARIN) vaginal cream Place vaginally.  . cycloSPORINE, PF, (CEQUA) 0.09 % SOLN Apply to eye. Both eye BID  . esomeprazole (NEXIUM) 20 MG capsule Take 20 mg by mouth daily at 12 noon.  . fluticasone (FLONASE) 50 MCG/ACT nasal spray Place into the nose.  . Lactobacillus Rhamnosus, GG, (CULTURELLE) CAPS Take by mouth.  . losartan (COZAAR) 50 MG tablet 50 mg.   . metoprolol tartrate (LOPRESSOR) 50 MG tablet Take by mouth.  . prednisoLONE Acetate-Nepafenac 1-0.1 % SUSP Apply to eye. Both eyes  . PRESCRIPTION MEDICATION Cholestyramine for Oral Suspension  . rosuvastatin (CRESTOR) 10 MG tablet Take by mouth.  . triamcinolone cream (KENALOG) 0.1 % Apply 1 application topically 2 (two) times daily. For 2 weeks, then as needed  . zolpidem (AMBIEN CR) 12.5 MG CR tablet Take by mouth.   No facility-administered encounter medications on file as of 08/07/2019.     Allergies (verified) Aspirin, Nsaids, Penicillins, and Lisinopril   History: Past Medical  History:  Diagnosis Date  . Basal cell carcinoma of skin 02/29/2012   basal cell carcinoma on her left melolabial fold approximately 30 years  ago  . Essential hypertension 03/29/2019  . GERD (gastroesophageal reflux disease) 03/29/2019   Chronic, normal EGD. Chronic PPI  . Mild intermittent asthma without complication [id number]  . Postmenopausal atrophic vaginitis 03/13/2013  . Primary insomnia 11/16/2017  . Seasonal allergic rhinitis due to pollen 03/29/2019   No past surgical history on file. Family History  Problem Relation Age of Onset  . Lung cancer Mother   . Heart disease Father   . Heart attack Father   . Healthy Son   . Healthy Son   . High blood pressure Brother    Social History   Socioeconomic History  . Marital status: Married    Spouse name: Not on file  . Number of children: 2  . Years of education: Not on file  . Highest education level: Not on file  Occupational History  . Occupation: retired Tour manager  . Financial resource strain: Not on file  . Food insecurity    Worry: Not on file    Inability: Not on file  . Transportation needs    Medical: Not on file    Non-medical: Not on file  Tobacco Use  . Smoking status: Never Smoker  . Smokeless tobacco: Never Used  Substance and Sexual Activity  . Alcohol use: Yes  . Drug use: Never  . Sexual activity:  Yes    Birth control/protection: Post-menopausal  Lifestyle  . Physical activity    Days per week: Not on file    Minutes per session: Not on file  . Stress: Not on file  Relationships  . Social Herbalist on phone: Not on file    Gets together: Not on file    Attends religious service: Not on file    Active member of club or organization: Not on file    Attends meetings of clubs or organizations: Not on file    Relationship status: Not on file  Other Topics Concern  . Not on file  Social History Narrative  . Not on file    Tobacco Counseling Counseling given: Not Answered    Clinical Intake:  Pre-visit preparation completed: Yes  Pain : No/denies pain  Diabetes: No  How often do you need to have someone help you when you read instructions, pamphlets, or other written materials from your doctor or pharmacy?: 1 - Never  Interpreter Needed?: No  Information entered by :: Denman George LPN   Activities of Daily Living In your present state of health, do you have any difficulty performing the following activities: 08/07/2019  Hearing? N  Vision? N  Difficulty concentrating or making decisions? N  Walking or climbing stairs? N  Dressing or bathing? N  Doing errands, shopping? N  Preparing Food and eating ? N  Using the Toilet? N  In the past six months, have you accidently leaked urine? N  Do you have problems with loss of bowel control? N  Managing your Medications? N  Managing your Finances? N  Housekeeping or managing your Housekeeping? N  Some recent data might be hidden     Immunizations and Health Maintenance Immunization History  Administered Date(s) Administered  . Fluad Quad(high Dose 65+) 06/19/2019  . Influenza Split 08/30/2012, 08/18/2013, 07/18/2014  . Influenza, Seasonal, Injecte, Preservative Fre 07/16/2011  . Influenza,inj,quad, With Preservative 07/01/2015, 06/30/2016, 06/28/2018  . Pneumococcal Conjugate-13 10/24/2014  . Pneumococcal Polysaccharide-23 10/29/2015  . Tdap 01/08/2011   Health Maintenance Due  Topic Date Due  . Hepatitis C Screening  Oct 04, 1950  . DEXA SCAN  02/27/2019  . MAMMOGRAM  04/07/2019    Patient Care Team: Leamon Arnt, MD as PCP - General (Family Medicine) Lenon Oms, MD as Consulting Physician (Obstetrics and Gynecology) Randye Lobo, Londell Moh, FNP as Consulting Physician (Dermatology) Trula Slade, DPM as Consulting Physician (Podiatry)  Indicate any recent Medical Services you may have received from other than [facility] providers in the past year (date may be approximate).      Assessment:   This is a routine wellness examination for Tina Kelley.  Hearing/Vision screen No exam data present  Dietary issues and exercise activities discussed: Current Exercise Habits: Home exercise routine, Type of exercise: walking, Time (Minutes): 45, Frequency (Times/Week): 4, Weekly Exercise (Minutes/Week): 180, Intensity: Mild  Goals   None    Depression Screen PHQ 2/9 Scores 08/07/2019 03/29/2019  PHQ - 2 Score 1 2  PHQ- 9 Score - 3    Fall Risk Fall Risk  08/07/2019 03/29/2019  Falls in the past year? 0 0  Number falls in past yr: - 0  Injury with Fall? 0 0  Follow up Falls evaluation completed;Education provided;Falls prevention discussed Falls evaluation completed    Is the patient's home free of loose throw rugs in walkways, pet beds, electrical cords, etc?  yes      Grab bars in the  bathroom? Yes       Handrails on the stairs? Yes       Adequate lighting? Yes   Timed Get Up and Go Performed completed and within normal timeframe; no gait abnormalities noted    Cognitive Function: MMSE - Mini Mental State Exam 08/07/2019  Orientation to time 5  Orientation to Place 5  Registration 3  Attention/ Calculation 5  Recall 3  Language- name 2 objects 2  Language- repeat 1  Language- follow 3 step command 3  Language- read & follow direction 1  Write a sentence 1  Copy design 1  Total score 30        Screening Tests Health Maintenance  Topic Date Due  . Hepatitis C Screening  1951/02/06  . DEXA SCAN  02/27/2019  . MAMMOGRAM  04/07/2019  . COLONOSCOPY  04/06/2020  . TETANUS/TDAP  01/07/2021  . INFLUENZA VACCINE  Completed  . PNA vac Low Risk Adult  Completed    Qualifies for Shingles Vaccine? Discussed and patient will check with pharmacy for coverage.  Patient education handout provided   Cancer Screenings: Lung: Low Dose CT Chest recommended if Age 66-80 years, 30 pack-year currently smoking OR have quit w/in 15years. Patient does not qualify. Breast:  Up to date on Mammogram? Yes   Up to date of Bone Density/Dexa? Yes Colorectal: colonoscopy 04/06/10; referral placed today     Plan:  I have personally reviewed and addressed the Medicare Annual Wellness questionnaire and have noted the following in the patient's chart:  A. Medical and social history B. Use of alcohol, tobacco or illicit drugs  C. Current medications and supplements D. Functional ability and status E.  Nutritional status F.  Physical activity G. Advance directives H. List of other physicians I.  Hospitalizations, surgeries, and ER visits in previous 12 months J.  Bodfish such as hearing and vision if needed, cognitive and depression L. Referrals, records requested, and appointments- referral for screening colonoscopy; will request records from last mammogram and dexa   In addition, I have reviewed and discussed with patient certain preventive protocols, quality metrics, and best practice recommendations. A written personalized care plan for preventive services as well as general preventive health recommendations were provided to patient.   Signed,  Denman George, LPN  Nurse Health Advisor   Nurse Notes: no additional

## 2019-08-07 NOTE — Progress Notes (Signed)
I have reviewed the documentation from the recent AWV done by Courtney Slade, RN; I agree with the documentation and will follow up on any recommendations or abnormal findings as suggested.  

## 2019-08-08 ENCOUNTER — Encounter: Payer: Self-pay | Admitting: Family Medicine

## 2019-08-28 ENCOUNTER — Encounter: Payer: Self-pay | Admitting: Family Medicine

## 2019-08-29 ENCOUNTER — Encounter: Payer: Self-pay | Admitting: Internal Medicine

## 2019-09-01 ENCOUNTER — Other Ambulatory Visit: Payer: Self-pay

## 2019-09-04 ENCOUNTER — Other Ambulatory Visit: Payer: Self-pay

## 2019-09-04 ENCOUNTER — Ambulatory Visit: Payer: Medicare Other | Admitting: Family Medicine

## 2019-09-04 ENCOUNTER — Encounter: Payer: Self-pay | Admitting: Family Medicine

## 2019-09-04 VITALS — BP 118/64 | HR 70 | Temp 97.7°F | Ht 65.0 in | Wt 139.0 lb

## 2019-09-04 DIAGNOSIS — H04123 Dry eye syndrome of bilateral lacrimal glands: Secondary | ICD-10-CM

## 2019-09-04 DIAGNOSIS — R682 Dry mouth, unspecified: Secondary | ICD-10-CM | POA: Diagnosis not present

## 2019-09-04 DIAGNOSIS — K219 Gastro-esophageal reflux disease without esophagitis: Secondary | ICD-10-CM

## 2019-09-04 DIAGNOSIS — M1991 Primary osteoarthritis, unspecified site: Secondary | ICD-10-CM | POA: Insufficient documentation

## 2019-09-04 DIAGNOSIS — J309 Allergic rhinitis, unspecified: Secondary | ICD-10-CM | POA: Diagnosis not present

## 2019-09-04 NOTE — Progress Notes (Signed)
Subjective  CC:  Chief Complaint  Patient presents with  . Dry Mouth  . Dry Eyes    HPI: Tina Kelley is a 68 y.o. female who presents to the office today to address the problems listed above in the chief complaint.  68 yo overall healthy with 2 year h/o dry eye syndrome, recent eval by dr. Laban Emperor reported dry mouth as well: ? Sjogren's. Pt reports eye sxs are mild to moderate; denies scratchy or painful eyes. occ will have dry mouth during the day; not bothersome at night. No increase in dental cavities. No FH of Rheum d/o. No joint issues.   GERD: pretty well controlled on nexium. To see GI next month for colonoscopy, routine and will discuss if she needs EGD with him at that time: however no worsening sxs. Has had egd nl about 7 years ago. Does have gerd induced cough at time.   AR: not currently on meds: admits to PND and am hoarseness. Has used flonase in the past.   Assessment  1. Dry eye syndrome of both eyes   2. Dry mouth   3. Gastroesophageal reflux disease without esophagitis   4. Chronic allergic rhinitis      Plan   Dry eyes and mouth:  Mild sxs; doubt sjogren's but we elect to check labs in March at her upcoming physical. IF any are suggestive of sjogren's will then send to rheum. Continue eye care and to get second opinion with ophthalmology.   GERD: increase nexium to 40 daily x 2-4 weeks; then can discuss with GI if sxs are not well controlled. Fair control now  AR: with hoarseness. Restart flonase  Follow up:  [facility] visit 11/24/2019  No orders of the defined types were placed in this encounter.  No orders of the defined types were placed in this encounter.     I reviewed the patients updated PMH, FH, and SocHx.    Patient Active Problem List   Diagnosis Date Noted  . Primary localized osteoarthrosis of multiple sites 09/04/2019  . Chronic allergic rhinitis 09/04/2019  . Benign essential hypertension 03/29/2019  . Seasonal allergic rhinitis due to pollen  03/29/2019  . Mild intermittent asthma without complication [id number]  . Dry eye syndrome 03/29/2019  . Family history of premature CAD 03/29/2019  . GAD (generalized anxiety disorder) 04/13/2018  . Mixed hyperlipidemia 04/13/2018  . Primary insomnia 11/16/2017  . Gastroesophageal reflux disease 10/29/2015  . Postmenopausal atrophic vaginitis 03/13/2013  . Dyspareunia 03/13/2013  . Basal cell carcinoma of skin 02/29/2012   Current Meds  Medication Sig  . albuterol (VENTOLIN HFA) 108 (90 Base) MCG/ACT inhaler   . buPROPion (WELLBUTRIN SR) 150 MG 12 hr tablet   . conjugated estrogens (PREMARIN) vaginal cream Place vaginally.  . cycloSPORINE, PF, (CEQUA) 0.09 % SOLN Apply to eye. Both eye BID  . esomeprazole (NEXIUM) 20 MG capsule Take 20 mg by mouth daily at 12 noon.  . fluticasone (FLONASE) 50 MCG/ACT nasal spray Place into the nose.  . Lactobacillus Rhamnosus, GG, (CULTURELLE) CAPS Take by mouth.  . losartan (COZAAR) 50 MG tablet 50 mg.   . metoprolol tartrate (LOPRESSOR) 50 MG tablet Take by mouth.  . prednisoLONE Acetate-Nepafenac 1-0.1 % SUSP Apply to eye. Both eyes  . PRESCRIPTION MEDICATION Cholestyramine for Oral Suspension  . rosuvastatin (CRESTOR) 10 MG tablet Take by mouth.  . triamcinolone cream (KENALOG) 0.1 % Apply 1 application topically 2 (two) times daily. For 2 weeks, then as needed  . zolpidem (  AMBIEN CR) 12.5 MG CR tablet Take by mouth.    Allergies: Patient is allergic to aspirin; nsaids; penicillins; and lisinopril. Family History: Patient family history includes Healthy in her son and son; Heart attack in her father; Heart disease in her father; High blood pressure in her brother; Lung cancer in her mother. Social History:  Patient  reports that she has never smoked. She has never used smokeless tobacco. She reports current alcohol use. She reports that she does not use drugs.  Review of Systems: Constitutional: Negative for fever malaise or anorexia  Cardiovascular: negative for chest pain Respiratory: negative for SOB or persistent cough Gastrointestinal: negative for abdominal pain  Objective  Vitals: BP 118/64 (BP Location: Left Arm, Patient Position: Sitting, Cuff Size: Normal)   Pulse 70   Temp 97.7 F (36.5 C) (Temporal)   Ht 5\' 5"  (1.651 m)   Wt 139 lb (63 kg)   SpO2 98%   BMI 23.13 kg/m  General: no acute distress , A&Ox3 HEENT: PEERL, conjunctiva normal,    Commons side effects, risks, benefits, and alternatives for medications and treatment plan prescribed today were discussed, and the patient expressed understanding of the given instructions. Patient is instructed to call or message via MyChart if he/she has any questions or concerns regarding our treatment plan. No barriers to understanding were identified. We discussed Red Flag symptoms and signs in detail. Patient expressed understanding regarding what to do in case of urgent or emergency type symptoms.   Medication list was reconciled, printed and provided to the patient in AVS. Patient instructions and summary information was reviewed with the patient as documented in the AVS. This note was prepared with assistance of Dragon voice recognition software. Occasional wrong-word or sound-a-like substitutions may have occurred due to the inherent limitations of voice recognition software  This visit occurred during the SARS-CoV-2 public health emergency.  Safety protocols were in place, including screening questions prior to the visit, additional usage of staff PPE, and extensive cleaning of exam room while observing appropriate contact time as indicated for disinfecting solutions.

## 2019-09-04 NOTE — Patient Instructions (Signed)
Please follow up as scheduled for your next visit with me: 11/24/2019   If you have any questions or concerns, please don't hesitate to send me a message via MyChart or call the office at [phone]. Thank you for visiting with Tina Kelley today! It's our pleasure caring for you.  Restart flonase and consider increasing your dose of nexium to 40mg  daily for several weeks to see if that helps.   I will check some blood work for you regarding the sjogren's diagnosis at your physical in March. At the moment, I do not think you have it.    Sjgren's Syndrome Sjgren's syndrome is a disease in which the body's disease-fighting system (immune system) attacks the glands that produce tears (lacrimal glands) and the glands that produce saliva (salivary glands). This makes the eyes and mouth very dry. Sjgren's syndrome is a long-term (chronic) disorder that has no cure. In some cases, it is linked to other disorders (rheumatic disorders), such as rheumatoid arthritis and systemic lupus erythematosus (SLE). It may affect other parts of the body, such as the:  Kidneys.  Blood vessels.  Joints.  Lungs.  Liver.  Pancreas.  Brain.  Nerves.  Spinal cord. What are the causes? The cause of this condition is not known. It may be passed along from parent to child (inherited), or it may be a symptom of a rheumatic disorder. What increases the risk? This condition is more likely to develop in:  Women.  People who are 64-68 years old.  People who have recently had a viral infection or currently have a viral infection. What are the signs or symptoms? The main symptoms of this condition are:  Dry mouth. This may include: ? A chalky feeling. ? Difficulty swallowing, speaking, or tasting. ? Frequent cavities in the teeth. ? Frequent mouth infections.  Dry eyes. This may include: ? Burning, redness, and itching. ? Blurry vision. ? Light sensitivity. Other symptoms may include:  Dryness of the  skin and the inside of the nose.  Eyelid infections.  Vaginal dryness, if this applies.  Joint pain and stiffness.  Muscle pain and stiffness. How is this diagnosed? This condition is diagnosed based on:  Your symptoms.  Your medical history.  A physical exam of your eyes and mouth.  Tests, including: ? A Schirmer test. This tests your tear production. ? An eye exam that is done with a magnifying device (slit-lamp exam). ? An eye test that temporarily stains your eye with dye. This shows the extent of eye damage. ? Tests to check your salivary gland function. ? Biopsy. This is a removal of part of a salivary gland from inside your lower lip to be studied under a microscope. ? Chest X-rays. ? Blood tests. ? Urine tests. How is this treated? There is no cure for this condition, but treatment can help you manage your symptoms. This condition may be treated with:  Moisture replacement therapies to help relieve dryness in your skin, mouth, and eyes.  Medicines to help relieve pain and stiffness.  Medicines to help relieve inflammation in your body (corticosteroids). These are usually for severe cases.  Medicines to help reduce the activity of your immune system (immunosuppressants).  Surgery or insertion of plugs to close the lacrimal glands (punctal occlusion). This helps keep more natural tears in your eyes. Follow these instructions at home: Eye care   Use eye drops as told by your health care provider.  Protect your eyes from the sun and wind with sunglasses or  glasses.  Blink at least 5-6 times a minute.  Maintain properly humidified air. You may want to use a humidifier at home.  Avoid smoke. Mouth care  Brush your teeth and floss after every meal.  Chew sugar-free gum or suck on hard candy. This may help to relieve dry mouth.  Use antimicrobial mouthwash daily.  Take frequent sips of water or sugar-free drinks.  Use saliva substitutes or lip balm as  told by your health care provider.  Schedule and attend dentist visits every 6 months. General instructions   Take over-the-counter and prescription medicines only as told by your health care provider.  Drink enough fluid to keep your urine pale yellow.  Keep all follow-up visits as told by your health care provider. This is important. Contact a health care provider if:  You have a fever.  You have night sweats.  You are always tired.  You have unexplained weight loss.  You develop itchy skin.  You have red patches on your skin.  You have a lump or swelling on your neck. Summary  Sjgren's syndrome is a disease in which the body's disease-fighting system attacks the glands that produce tears and the glands that produce saliva.  This condition makes the eyes and mouth very dry.  Sjgren's syndrome is a long-term (chronic) disorder that has no cure.  The cause of this condition is not known.  There is no cure for this condition, but treatment can help you manage your symptoms. This information is not intended to replace advice given to you by your health care provider. Make sure you discuss any questions you have with your health care provider. Document Released: 09/04/2002 Document Revised: 07/21/2018 Document Reviewed: 07/21/2018 Elsevier Patient Education  2020 [employer].

## 2019-09-15 ENCOUNTER — Encounter: Payer: Self-pay | Admitting: Family Medicine

## 2019-09-28 ENCOUNTER — Encounter: Payer: Self-pay | Admitting: *Deleted

## 2019-10-04 ENCOUNTER — Ambulatory Visit: Payer: Medicare PPO | Admitting: Internal Medicine

## 2019-10-04 ENCOUNTER — Encounter: Payer: Self-pay | Admitting: Family Medicine

## 2019-10-04 ENCOUNTER — Encounter: Payer: Self-pay | Admitting: Internal Medicine

## 2019-10-04 VITALS — BP 110/64 | HR 72 | Temp 97.4°F | Ht 65.5 in | Wt 136.4 lb

## 2019-10-04 DIAGNOSIS — K9089 Other intestinal malabsorption: Secondary | ICD-10-CM | POA: Diagnosis not present

## 2019-10-04 DIAGNOSIS — K219 Gastro-esophageal reflux disease without esophagitis: Secondary | ICD-10-CM

## 2019-10-04 DIAGNOSIS — Z1211 Encounter for screening for malignant neoplasm of colon: Secondary | ICD-10-CM

## 2019-10-04 MED ORDER — SUPREP BOWEL PREP KIT 17.5-3.13-1.6 GM/177ML PO SOLN: 1.0000 | ORAL | 0 refills | Status: DC

## 2019-10-04 MED ORDER — ESOMEPRAZOLE MAGNESIUM 20 MG PO CPDR: DELAYED_RELEASE_CAPSULE | ORAL | 0 refills | Status: DC

## 2019-10-04 NOTE — Progress Notes (Signed)
Patient ID: Tina Kelley, female   DOB: [birth year], 69 y.o.   MRN: [id number] HPI: Tina Kelley is a 69 year old female with a past medical history of GERD, history of diverticulitis, hypertension, history of remote C. difficile associated with antibiotics in 2012, prior gallbladder disease status post cholecystectomy 3 years ago who is seen to establish care and discuss screening colonoscopy.  She is here alone today.  She reports that she is feeling well.  She does deal with loose stools which have been present primarily since her cholecystectomy 3 years ago.  She is using Questran 4 g to 3 days/week.  If she uses it daily she will have constipation.  She does have a history of heartburn and reflux disease.  She has some heartburn but is taking over-the-counter Nexium 20 mg a day.  She deals with some hoarseness and throat burning.  Her primary care is going to evaluate her for Sjogren's syndrome as she has had issues with dry eyes and dry mouth.  She has had no change in bowel habit.  No blood in her stool or melena.  No abdominal pain.  She had an upper endoscopy which she has a picture of in March 2016.  This was showed a normal esophagus and erosive gastritis.  Her last colonoscopy was 10 years ago which she reports was normal.  There is no family history of colon cancer.  Past Medical History:  Diagnosis Date  . Basal cell carcinoma of skin 02/29/2012   basal cell carcinoma on her left melolabial fold approximately 30 years  ago  . Diverticulitis   . Essential hypertension 03/29/2019  . GERD (gastroesophageal reflux disease) 03/29/2019   Chronic, normal EGD. Chronic PPI  . Mild intermittent asthma without complication [id number]  . Postmenopausal atrophic vaginitis 03/13/2013  . Primary insomnia 11/16/2017  . Seasonal allergic rhinitis due to pollen 03/29/2019    Past Surgical History:  Procedure Laterality Date  . COLONOSCOPY     x2    Outpatient Medications Prior to Visit  Medication Sig  Dispense Refill  . albuterol (VENTOLIN HFA) 108 (90 Base) MCG/ACT inhaler     . buPROPion (WELLBUTRIN SR) 150 MG 12 hr tablet     . cholestyramine (QUESTRAN) 4 g packet cholestyramine (with sugar) 4 gram powder for susp in a packet    . conjugated estrogens (PREMARIN) vaginal cream Place vaginally.    . cycloSPORINE (RESTASIS) 0.05 % ophthalmic emulsion Place 1 drop into both eyes 2 (two) times daily.    . fluticasone (FLONASE) 50 MCG/ACT nasal spray Place into the nose.    . Lactobacillus Rhamnosus, GG, (CULTURELLE) CAPS Take by mouth.    . losartan (COZAAR) 50 MG tablet 50 mg.     . metoprolol tartrate (LOPRESSOR) 50 MG tablet Take by mouth.    . prednisoLONE Acetate-Nepafenac 1-0.1 % SUSP Apply to eye. Both eyes    . PRESCRIPTION MEDICATION Cholestyramine for Oral Suspension    . rosuvastatin (CRESTOR) 10 MG tablet Take by mouth.    . triamcinolone cream (KENALOG) 0.1 % Apply 1 application topically 2 (two) times daily. For 2 weeks, then as needed 28.4 g 0  . zolpidem (AMBIEN CR) 12.5 MG CR tablet Take by mouth.    . esomeprazole (NEXIUM) 20 MG capsule Take 20 mg by mouth daily at 12 noon.    . cycloSPORINE, PF, (CEQUA) 0.09 % SOLN Apply to eye. Both eye BID     No facility-administered medications prior to visit.    Allergies  Allergen Reactions  . Aspirin Other (See Comments)    Asthma  . Nsaids Shortness Of Breath  . Penicillins Other (See Comments)    Told as a child  . Lisinopril Cough    Family History  Problem Relation Age of Onset  . Lung cancer Mother   . Heart disease Father   . Heart attack Father   . Healthy Son   . Healthy Son   . High blood pressure Brother     Social History   Tobacco Use  . Smoking status: Never Smoker  . Smokeless tobacco: Never Used  Substance Use Topics  . Alcohol use: Yes  . Drug use: Never    ROS: As per history of present illness, otherwise negative  BP 110/64   Pulse 72   Temp (!) 97.4 F (36.3 C)   Ht 5' 5.5" (1.664  m)   Wt 136 lb 6.4 oz (61.9 kg)   BMI 22.35 kg/m  Constitutional: Well-developed and well-nourished. No distress. HEENT: Normocephalic and atraumatic. Conjunctivae are normal.  No scleral icterus. Neck: Neck supple. Trachea midline. Cardiovascular: Normal rate, regular rhythm and intact distal pulses. No M/R/G Pulmonary/chest: Effort normal and breath sounds normal. No wheezing, rales or rhonchi. Abdominal: Soft, nontender, nondistended. Bowel sounds active throughout. There are no masses palpable. No hepatosplenomegaly. Extremities: no clubbing, cyanosis, or edema Neurological: Alert and oriented to person place and time. Skin: Skin is warm and dry.  Psychiatric: Normal mood and affect. Behavior is normal.  RELEVANT LABS AND IMAGING: CBC    Component Value Date/Time   WBC 7.5 11/22/2018 0000   HGB 14.7 11/22/2018 0000   HCT 42 11/22/2018 0000   PLT 309 11/22/2018 0000    CMP     Component Value Date/Time   NA 140 11/22/2018 0000   K 4.2 11/22/2018 0000   BUN 15 11/22/2018 0000   CREATININE 0.6 11/22/2018 0000   AST 28 11/22/2018 0000   ALT 29 11/22/2018 0000   ALKPHOS 81 11/22/2018 0000    ASSESSMENT/PLAN: 69 year old female with a past medical history of GERD, history of diverticulitis, hypertension, history of remote C. difficile associated with antibiotics in 2012, prior gallbladder disease status post cholecystectomy 3 years ago who is seen to establish care and discuss screening colonoscopy  1.  Colon cancer screening --she is due a screening colonoscopy at this time.  We discussed the risk, benefits and alternatives and she is agreeable and wishes to proceed  2.  Bile salt diarrhea --she is using cholestyramine 4 g packets 2 or 3 days/week and not always a full dose on any given day.  For now this is working for her.  We discussed substituting colestipol given the ability for lower dosing if she is interested.  She prefers to stick with her current regimen.  She can  let me know if she wishes to alter therapy  3.  GERD --normal esophagus at endoscopy 4 years ago.  No history of Barrett's.  She will continue Nexium 20 mg a day.  If breakthrough heartburn is occurring on a regular basis I would recommend changing to 20 mg twice daily or 40 mg once daily      Cc:Leamon Arnt, [address] [place],  [place] [address]

## 2019-10-04 NOTE — Telephone Encounter (Signed)
Do you agree with trying to change charge for [phone] to see if insurance will correct claim?

## 2019-10-04 NOTE — Patient Instructions (Signed)
If you are age 70 or older, your body mass index should be between 23-30. Your Body mass index is 22.35 kg/m. If this is out of the aforementioned range listed, please consider follow up with your Primary Care Provider.  If you are age 67 or younger, your body mass index should be between 19-25. Your Body mass index is 22.35 kg/m. If this is out of the aformentioned range listed, please consider follow up with your Primary Care Provider.   You have been scheduled for a colonoscopy. Please follow written instructions given to you at your visit today.  Please pick up your prep supplies at the pharmacy within the next 1-3 days. If you use inhalers (even only as needed), please bring them with you on the day of your procedure.  CONTINUE Nexium 20mg  as needed.  May increase Nexium to 40mg  as needed. CONTINUE Cholestyramine 4 gram 2 - 3 days per week.   Due to recent changes in healthcare laws, you may see the results of your imaging and laboratory studies on MyChart before your provider has had a chance to review them.  We understand that in some cases there may be results that are confusing or concerning to you. Not all laboratory results come back in the same time frame and the provider may be waiting for multiple results in order to interpret others.  Please give [place] 48 hours in order for your provider to thoroughly review all the results before contacting the office for clarification of your results.

## 2019-10-04 NOTE — Telephone Encounter (Signed)
I am unsure of why her insurance did not pay for it. Did she have an intital visit somewhere else? Coding stated that the diagnosis/codes are correct.

## 2019-10-04 NOTE — Telephone Encounter (Signed)
From patient in regards to a bill. Thanks

## 2019-10-05 ENCOUNTER — Encounter: Payer: Self-pay | Admitting: Family Medicine

## 2019-10-05 DIAGNOSIS — K9089 Other intestinal malabsorption: Secondary | ICD-10-CM | POA: Insufficient documentation

## 2019-10-05 NOTE — Progress Notes (Signed)
Reviewed report/notes and updated pt's chart/history/PL and/or HM accordingly. 

## 2019-10-11 NOTE — Addendum Note (Signed)
Addended by: Denman George B on: 10/11/2019 02:09 PM   Modules accepted: Level of Service

## 2019-10-11 NOTE — Telephone Encounter (Signed)
On this patient I went in and changed the charges to see if the service will be covered under the G0439.  Can the claim be resubmitted?

## 2019-10-12 ENCOUNTER — Encounter: Payer: Self-pay | Admitting: Family Medicine

## 2019-10-17 ENCOUNTER — Encounter: Payer: Self-pay | Admitting: Family Medicine

## 2019-10-19 ENCOUNTER — Encounter: Payer: Self-pay | Admitting: Family Medicine

## 2019-10-24 ENCOUNTER — Ambulatory Visit: Payer: Medicare PPO

## 2019-10-24 ENCOUNTER — Encounter: Payer: Self-pay | Admitting: Family Medicine

## 2019-10-24 ENCOUNTER — Other Ambulatory Visit: Payer: Self-pay

## 2019-10-24 DIAGNOSIS — J309 Allergic rhinitis, unspecified: Secondary | ICD-10-CM

## 2019-10-24 MED ORDER — EPINEPHRINE 0.3 MG/0.3ML IJ SOAJ: 0.3000 mg | INTRAMUSCULAR | 1 refills | Status: DC | PRN

## 2019-10-25 ENCOUNTER — Other Ambulatory Visit: Payer: Self-pay | Admitting: Internal Medicine

## 2019-10-25 ENCOUNTER — Ambulatory Visit (INDEPENDENT_AMBULATORY_CARE_PROVIDER_SITE_OTHER): Payer: Medicare PPO

## 2019-10-25 DIAGNOSIS — Z1159 Encounter for screening for other viral diseases: Secondary | ICD-10-CM

## 2019-10-26 LAB — SARS CORONAVIRUS 2 (TAT 6-24 HRS): SARS Coronavirus 2: NEGATIVE

## 2019-10-27 ENCOUNTER — Encounter: Payer: Self-pay | Admitting: Internal Medicine

## 2019-10-27 ENCOUNTER — Other Ambulatory Visit: Payer: Self-pay

## 2019-10-27 ENCOUNTER — Ambulatory Visit (AMBULATORY_SURGERY_CENTER): Payer: Medicare PPO | Admitting: Internal Medicine

## 2019-10-27 VITALS — BP 135/82 | HR 70 | Temp 96.8°F | Resp 13 | Ht 65.0 in | Wt 136.0 lb

## 2019-10-27 DIAGNOSIS — D120 Benign neoplasm of cecum: Secondary | ICD-10-CM | POA: Diagnosis not present

## 2019-10-27 DIAGNOSIS — Z1211 Encounter for screening for malignant neoplasm of colon: Secondary | ICD-10-CM | POA: Diagnosis not present

## 2019-10-27 MED ORDER — SODIUM CHLORIDE 0.9 % IV SOLN: 500.0000 mL | Freq: Once | INTRAVENOUS | Status: DC

## 2019-10-27 NOTE — Progress Notes (Signed)
To PACU, VSS. Report to [email or web address] 

## 2019-10-27 NOTE — Op Note (Signed)
[facility] Patient Name: Tina Kelley Procedure Date: 10/27/2019 4:15 PM MRN: [id number] Endoscopist: Jerene Bears , MD Age: 69 Referring MD:  Date of Birth: 1951-01-29 Gender: Female Account #: [id number] Procedure:                Colonoscopy Indications:              Screening for colorectal malignant neoplasm, Last                            colonoscopy 10 years ago Medicines:                Monitored Anesthesia Care Procedure:                Pre-Anesthesia Assessment:                           - Prior to the procedure, a History and Physical                            was performed, and patient medications and                            allergies were reviewed. The patient's tolerance of                            previous anesthesia was also reviewed. The risks                            and benefits of the procedure and the sedation                            options and risks were discussed with the patient.                            All questions were answered, and informed consent                            was obtained. Prior Anticoagulants: The patient has                            taken no previous anticoagulant or antiplatelet                            agents. ASA Grade Assessment: II - A patient with                            mild systemic disease. After reviewing the risks                            and benefits, the patient was deemed in                            satisfactory condition to undergo the procedure.  After obtaining informed consent, the colonoscope                            was passed under direct vision. Throughout the                            procedure, the patient's blood pressure, pulse, and                            oxygen saturations were monitored continuously. The                            Colonoscope was introduced through the anus and                            advanced to the terminal ileum. The  patient                            tolerated the procedure well. The quality of the                            bowel preparation was good. The terminal ileum,                            ileocecal valve, appendiceal orifice, and rectum                            were photographed. The colonoscopy was somewhat                            difficult due to multiple diverticula in the colon                            and restricted mobility of the colon. Scope In: 4:21:40 PM Scope Out: 4:42:26 PM Scope Withdrawal Time: 0 hours 10 minutes 52 seconds  Total Procedure Duration: 0 hours 20 minutes 46 seconds  Findings:                 The digital rectal exam was normal.                           The terminal ileum appeared normal.                           A 3 mm polyp was found in the cecum. The polyp was                            sessile. The polyp was removed with a cold snare.                            Resection and retrieval were complete.                           Multiple small and large-mouthed diverticula were  found in the recto-sigmoid colon and distal sigmoid                            colon. There was narrowing of the colon in                            association with the diverticular opening.                           Internal hemorrhoids were found during                            retroflexion. The hemorrhoids were small. Complications:            No immediate complications. Estimated Blood Loss:     Estimated blood loss was minimal. Impression:               - The examined portion of the ileum was normal.                           - One 3 mm polyp in the cecum, removed with a cold                            snare. Resected and retrieved.                           - Mild diverticulosis in the recto-sigmoid colon                            and in the distal sigmoid colon. There was                            narrowing of the colon in association with  the                            diverticular opening.                           - Small internal hemorrhoids. Recommendation:           - Patient has a contact number available for                            emergencies. The signs and symptoms of potential                            delayed complications were discussed with the                            patient. Return to normal activities tomorrow.                            Written discharge instructions were provided to the  patient.                           - Resume previous diet.                           - Continue present medications.                           - Await pathology results.                           - Repeat colonoscopy is recommended. The                            colonoscopy date will be determined after pathology                            results from today's exam become available for                            review. Jerene Bears, MD 10/27/2019 4:46:41 PM This report has been signed electronically.

## 2019-10-27 NOTE — Progress Notes (Signed)
Called to room to assist during endoscopic procedure.  Patient ID and intended procedure confirmed with present staff. Received instructions for my participation in the procedure from the performing physician.  

## 2019-10-27 NOTE — Progress Notes (Signed)
Pt's states no medical or surgical changes since previsit or office visit.  Vitals- Donna Temp- June 

## 2019-10-27 NOTE — Patient Instructions (Signed)
Handouts given for polyps, diverticulosis and hemorrhoids  YOU HAD AN ENDOSCOPIC PROCEDURE TODAY AT THE [facility]:   Refer to the procedure report that was given to you for any specific questions about what was found during the examination.  If the procedure report does not answer your questions, please call your gastroenterologist to clarify.  If you requested that your care partner not be given the details of your procedure findings, then the procedure report has been included in a sealed envelope for you to review at your convenience later.  YOU SHOULD EXPECT: Some feelings of bloating in the abdomen. Passage of more gas than usual.  Walking can help get rid of the air that was put into your GI tract during the procedure and reduce the bloating. If you had a lower endoscopy (such as a colonoscopy or flexible sigmoidoscopy) you may notice spotting of blood in your stool or on the toilet paper. If you underwent a bowel prep for your procedure, you may not have a normal bowel movement for a few days.  Please Note:  You might notice some irritation and congestion in your nose or some drainage.  This is from the oxygen used during your procedure.  There is no need for concern and it should clear up in a day or so.  SYMPTOMS TO REPORT IMMEDIATELY:   Following lower endoscopy (colonoscopy or flexible sigmoidoscopy):  Excessive amounts of blood in the stool  Significant tenderness or worsening of abdominal pains  Swelling of the abdomen that is new, acute  Fever of 100F or higher  For urgent or emergent issues, a gastroenterologist can be reached at any hour by calling [phone].   DIET:  We do recommend a small meal at first, but then you may proceed to your regular diet.  Drink plenty of fluids but you should avoid alcoholic beverages for 24 hours.  ACTIVITY:  You should plan to take it easy for the rest of today and you should NOT DRIVE or use heavy machinery until tomorrow  (because of the sedation medicines used during the test).    FOLLOW UP: Our staff will call the number listed on your records 48-72 hours following your procedure to check on you and address any questions or concerns that you may have regarding the information given to you following your procedure. If we do not reach you, we will leave a message.  We will attempt to reach you two times.  During this call, we will ask if you have developed any symptoms of COVID 19. If you develop any symptoms (ie: fever, flu-like symptoms, shortness of breath, cough etc.) before then, please call [phone].  If you test positive for Covid 19 in the 2 weeks post procedure, please call and report this information to us.    If any biopsies were taken you will be contacted by phone or by letter within the next 1-3 weeks.  Please call us at [phone] if you have not heard about the biopsies in 3 weeks.    SIGNATURES/CONFIDENTIALITY: You and/or your care partner have signed paperwork which will be entered into your electronic medical record.  These signatures attest to the fact that that the information above on your After Visit Summary has been reviewed and is understood.  Full responsibility of the confidentiality of this discharge information lies with you and/or your care-partner. 

## 2019-10-31 ENCOUNTER — Telehealth: Payer: Self-pay

## 2019-10-31 NOTE — Telephone Encounter (Signed)
  Follow up Call-  Call back number 10/27/2019  Post procedure Call Back phone  # [phone]  Permission to leave phone message Yes  Some recent data might be hidden     Patient questions:  Do you have a fever, pain , or abdominal swelling? No. Pain Score  0 *  Have you tolerated food without any problems? Yes.    Have you been able to return to your normal activities? Yes.    Do you have any questions about your discharge instructions: Diet   No. Medications  No. Follow up visit  No.  Do you have questions or concerns about your Care? No.  Actions: * If pain score is 4 or above: No action needed, pain <4.    1. Have you developed a fever since your procedure? No  2.   Have you had an respiratory symptoms (SOB or cough) since your procedure? No  3.   Have you tested positive for COVID 19 since your procedure No  4.   Have you had any family members/close contacts diagnosed with the COVID 19 since your procedure?  No   If yes to any of these questions please route to Joylene John, RN and Alphonsa Gin, RN.

## 2019-11-01 ENCOUNTER — Encounter: Payer: Self-pay | Admitting: Internal Medicine

## 2019-11-02 ENCOUNTER — Ambulatory Visit: Payer: Medicare PPO

## 2019-11-06 ENCOUNTER — Encounter: Payer: Self-pay | Admitting: Family Medicine

## 2019-11-14 ENCOUNTER — Ambulatory Visit: Payer: Medicare PPO

## 2019-11-24 ENCOUNTER — Ambulatory Visit (INDEPENDENT_AMBULATORY_CARE_PROVIDER_SITE_OTHER): Payer: Medicare PPO | Admitting: Family Medicine

## 2019-11-24 ENCOUNTER — Encounter: Payer: Self-pay | Admitting: Family Medicine

## 2019-11-24 ENCOUNTER — Other Ambulatory Visit: Payer: Self-pay

## 2019-11-24 VITALS — BP 122/76 | HR 67 | Temp 96.0°F | Ht 65.0 in | Wt 137.6 lb

## 2019-11-24 DIAGNOSIS — E782 Mixed hyperlipidemia: Secondary | ICD-10-CM

## 2019-11-24 DIAGNOSIS — Z0000 Encounter for general adult medical examination without abnormal findings: Secondary | ICD-10-CM | POA: Diagnosis not present

## 2019-11-24 DIAGNOSIS — Q525 Fusion of labia: Secondary | ICD-10-CM

## 2019-11-24 DIAGNOSIS — D72829 Elevated white blood cell count, unspecified: Secondary | ICD-10-CM

## 2019-11-24 DIAGNOSIS — K9089 Other intestinal malabsorption: Secondary | ICD-10-CM

## 2019-11-24 DIAGNOSIS — F411 Generalized anxiety disorder: Secondary | ICD-10-CM

## 2019-11-24 DIAGNOSIS — F5101 Primary insomnia: Secondary | ICD-10-CM

## 2019-11-24 DIAGNOSIS — I10 Essential (primary) hypertension: Secondary | ICD-10-CM | POA: Diagnosis not present

## 2019-11-24 DIAGNOSIS — H04123 Dry eye syndrome of bilateral lacrimal glands: Secondary | ICD-10-CM

## 2019-11-24 LAB — CBC WITH DIFFERENTIAL/PLATELET
Basophils Absolute: 0.1 10*3/uL (ref 0.0–0.1)
Basophils Relative: 0.7 % (ref 0.0–3.0)
Eosinophils Absolute: 0.2 10*3/uL (ref 0.0–0.7)
Eosinophils Relative: 1.3 % (ref 0.0–5.0)
HCT: 44.3 % (ref 36.0–46.0)
Hemoglobin: 14.8 g/dL (ref 12.0–15.0)
Lymphocytes Relative: 13.8 % (ref 12.0–46.0)
Lymphs Abs: 1.7 10*3/uL (ref 0.7–4.0)
MCHC: 33.5 g/dL (ref 30.0–36.0)
MCV: 94.9 fl (ref 78.0–100.0)
Monocytes Absolute: 1 10*3/uL (ref 0.1–1.0)
Monocytes Relative: 7.9 % (ref 3.0–12.0)
Neutro Abs: 9.3 10*3/uL — ABNORMAL HIGH (ref 1.4–7.7)
Neutrophils Relative %: 76.3 % (ref 43.0–77.0)
Platelets: 338 10*3/uL (ref 150.0–400.0)
RBC: 4.67 Mil/uL (ref 3.87–5.11)
RDW: 12.8 % (ref 11.5–15.5)
WBC: 12.2 10*3/uL — ABNORMAL HIGH (ref 4.0–10.5)

## 2019-11-24 LAB — COMPREHENSIVE METABOLIC PANEL
ALT: 26 U/L (ref 0–35)
AST: 27 U/L (ref 0–37)
Albumin: 4.2 g/dL (ref 3.5–5.2)
Alkaline Phosphatase: 85 U/L (ref 39–117)
BUN: 13 mg/dL (ref 6–23)
CO2: 28 mEq/L (ref 19–32)
Calcium: 9.7 mg/dL (ref 8.4–10.5)
Chloride: 101 mEq/L (ref 96–112)
Creatinine, Ser: 0.95 mg/dL (ref 0.40–1.20)
GFR: 58.14 mL/min — ABNORMAL LOW (ref >60.00)
Glucose, Bld: 105 mg/dL — ABNORMAL HIGH (ref 70–99)
Potassium: 4.1 mEq/L (ref 3.5–5.1)
Sodium: 138 mEq/L (ref 135–145)
Total Bilirubin: 0.7 mg/dL (ref 0.2–1.2)
Total Protein: 7 g/dL (ref 6.0–8.3)

## 2019-11-24 LAB — B12 AND FOLATE PANEL
Folate: >23.7 ng/mL (ref >5.9)
Vitamin B-12: 424 pg/mL (ref 211–911)

## 2019-11-24 LAB — LIPID PANEL
Cholesterol: 198 mg/dL (ref 0–200)
HDL: 68 mg/dL (ref >39.00)
LDL Cholesterol: 92 mg/dL (ref 0–99)
NonHDL: 129.58
Total CHOL/HDL Ratio: 3
Triglycerides: 190 mg/dL — ABNORMAL HIGH (ref 0.0–149.0)
VLDL: 38 mg/dL (ref 0.0–40.0)

## 2019-11-24 LAB — TSH: TSH: 1.72 u[IU]/mL (ref 0.35–4.50)

## 2019-11-24 MED ORDER — ESTROGENS, CONJUGATED 0.625 MG/GM VA CREA: TOPICAL_CREAM | VAGINAL | 2 refills | Status: DC

## 2019-11-24 NOTE — Patient Instructions (Signed)
Please return in 6 months for follow up of your hypertension.  I will release your lab results to you on your MyChart account with further instructions. Please reply with any questions.    If you have any questions or concerns, please don't hesitate to send me a message via MyChart or call the office at [phone]. Thank you for visiting with [place] today! It's our pleasure caring for you.   Preventive Care 70 Years and Older, Female Preventive care refers to lifestyle choices and visits with your health care provider that can promote health and wellness. This includes:  A yearly physical exam. This is also called an annual well check.  Regular dental and eye exams.  Immunizations.  Screening for certain conditions.  Healthy lifestyle choices, such as diet and exercise. What can I expect for my preventive care visit? Physical exam Your health care provider will check:  Height and weight. These may be used to calculate body mass index (BMI), which is a measurement that tells if you are at a healthy weight.  Heart rate and blood pressure.  Your skin for abnormal spots. Counseling Your health care provider may ask you questions about:  Alcohol, tobacco, and drug use.  Emotional well-being.  Home and relationship well-being.  Sexual activity.  Eating habits.  History of falls.  Memory and ability to understand (cognition).  Work and work Statistician.  Pregnancy and menstrual history. What immunizations do I need?  Influenza (flu) vaccine  This is recommended every year. Tetanus, diphtheria, and pertussis (Tdap) vaccine  You may need a Td booster every 10 years. Varicella (chickenpox) vaccine  You may need this vaccine if you have not already been vaccinated. Zoster (shingles) vaccine  You may need this after age 70. Pneumococcal conjugate (PCV13) vaccine  One dose is recommended after age 70. Pneumococcal polysaccharide (PPSV23) vaccine  One dose is  recommended after age 70. Measles, mumps, and rubella (MMR) vaccine  You may need at least one dose of MMR if you were born in 1957 or later. You may also need a second dose. Meningococcal conjugate (MenACWY) vaccine  You may need this if you have certain conditions. Hepatitis A vaccine  You may need this if you have certain conditions or if you travel or work in places where you may be exposed to hepatitis A. Hepatitis B vaccine  You may need this if you have certain conditions or if you travel or work in places where you may be exposed to hepatitis B. Haemophilus influenzae type b (Hib) vaccine  You may need this if you have certain conditions. You may receive vaccines as individual doses or as more than one vaccine together in one shot (combination vaccines). Talk with your health care provider about the risks and benefits of combination vaccines. What tests do I need? Blood tests  Lipid and cholesterol levels. These may be checked every 5 years, or more frequently depending on your overall health.  Hepatitis C test.  Hepatitis B test. Screening  Lung cancer screening. You may have this screening every year starting at age 60 if you have a 30-pack-year history of smoking and currently smoke or have quit within the past 15 years.  Colorectal cancer screening. All adults should have this screening starting at age 23 and continuing until age 52. Your health care provider may recommend screening at age 39 if you are at increased risk. You will have tests every 1-10 years, depending on your results and the type of screening test.  Diabetes screening. This is done by checking your blood sugar (glucose) after you have not eaten for a while (fasting). You may have this done every 1-3 years.  Mammogram. This may be done every 1-2 years. Talk with your health care provider about how often you should have regular mammograms.  BRCA-related cancer screening. This may be done if you have a  family history of breast, ovarian, tubal, or peritoneal cancers. Other tests  Sexually transmitted disease (STD) testing.  Bone density scan. This is done to screen for osteoporosis. You may have this done starting at age 15. Follow these instructions at home: Eating and drinking  Eat a diet that includes fresh fruits and vegetables, whole grains, lean protein, and low-fat dairy products. Limit your intake of foods with high amounts of sugar, saturated fats, and salt.  Take vitamin and mineral supplements as recommended by your health care provider.  Do not drink alcohol if your health care provider tells you not to drink.  If you drink alcohol: ? Limit how much you have to 0-1 drink a day. ? Be aware of how much alcohol is in your drink. In the [place] one drink equals one 12 oz bottle of beer (355 mL), one 5 oz glass of wine (148 mL), or one 1 oz glass of hard liquor (44 mL). Lifestyle  Take daily care of your teeth and gums.  Stay active. Exercise for at least 30 minutes on 5 or more days each week.  Do not use any products that contain nicotine or tobacco, such as cigarettes, e-cigarettes, and chewing tobacco. If you need help quitting, ask your health care provider.  If you are sexually active, practice safe sex. Use a condom or other form of protection in order to prevent STIs (sexually transmitted infections).  Talk with your health care provider about taking a low-dose aspirin or statin. What's next?  Go to your health care provider once a year for a well check visit.  Ask your health care provider how often you should have your eyes and teeth checked.  Stay up to date on all vaccines. This information is not intended to replace advice given to you by your health care provider. Make sure you discuss any questions you have with your health care provider. Document Revised: 09/08/2018 Document Reviewed: 09/08/2018 Elsevier Patient Education  2020 [employer].

## 2019-11-24 NOTE — Progress Notes (Signed)
Subjective  Chief Complaint  Patient presents with  . Annual Exam    fasting. dark place on vagina. nodule on neck that pt wants to get checked out  . Hyperlipidemia    takes rosuvatin. no side effects  . Hypertension    readings are <140/80 at home    HPI: Tina Kelley is a 69 y.o. female who presents to [place] at [facility] today for a Female Wellness Visit. She also has the concerns and/or needs as listed above in the chief complaint. These will be addressed in addition to the Health Maintenance Visit.   Wellness Visit: annual visit with health maintenance review and exam without Pap   HM: sees gyn; mammo and dexa done there. Need records. Reports osteopenia on dexa and nl mammo. AWV reviewed and up to date. Had colonoscopy with small polyp; recall 7 years. Had 1st covid vaccination. Moderna.  Chronic disease f/u and/or acute problem visit: (deemed necessary to be done in addition to the wellness visit):  HTN and HLD: on meds and reports doing well w/o AEs. Feeling well. Taking medications w/o adverse effects. No symptoms of CHF, angina; no palpitations, sob, cp or lower extremity edema. Compliant with meds.   Bile salt diarrhea: stable. Reviewed recent GI eval  GERD stable on nexium daily. Nl egd 4 years ago  GAD and insomnia remain well controlled.   Vaginal atrophy on prn vaginal estradiol. Hasn't used in a long time and now noted a small dark area on left labia w/o pain, itching or flaking.   Reviewed last note: dry eyes and prn dry mouth; to get labs for sjogren's today. Dry eye sxs are improving. Has f/u with eye doctor in may.   Assessment  1. Annual physical exam   2. Benign essential hypertension   3. Mixed hyperlipidemia   4. Bile salt-induced diarrhea   5. Dry eye syndrome of both eyes   6. Primary insomnia   7. GAD (generalized anxiety disorder)   8. Labial fusion      Plan  Female Wellness Visit:  Age appropriate Health Maintenance and  Prevention measures were discussed with patient. Included topics are cancer screening recommendations, ways to keep healthy (see AVS) including dietary and exercise recommendations, regular eye and dental care, use of seat belts, and avoidance of moderate alcohol use and tobacco use. Screens are up to date. Will request records/reports from gyn  BMI: discussed patient's BMI and encouraged positive lifestyle modifications to help get to or maintain a target BMI.  HM needs and immunizations were addressed and ordered. See below for orders. See HM and immunization section for updates. Utd. To get 2nd covid vaccine soon  Routine labs and screening tests ordered including cmp, cbc and lipids where appropriate.  Discussed recommendations regarding Vit D and calcium supplementation (see AVS)  Chronic disease management visit and/or acute problem visit:  Chronic problems are well controlled. Will follow labs. No medication changes today.  Check for sjogren antibodies.   Vaginal atrophy: restart vaginal estrogen cream. rec use on labia.   Follow up: 48months for htn and recheck  Orders Placed This Encounter  Procedures  . CBC with Differential/Platelet  . Comprehensive metabolic panel  . Lipid panel  . TSH  . B12 and Folate Panel  . Sjogren's syndrome antibods(ssa + ssb)  . Antinuclear Antib (ANA)   Meds ordered this encounter  Medications  . conjugated estrogens (PREMARIN) vaginal cream    Sig: Place vaginally 3 (three) times  a week.    Dispense:  42.5 g    Refill:  2      Lifestyle: Body mass index is 22.9 kg/m. Wt Readings from Last 3 Encounters:  11/24/19 137 lb 9.6 oz (62.4 kg)  10/27/19 136 lb (61.7 kg)  10/04/19 136 lb 6.4 oz (61.9 kg)     Patient Active Problem List   Diagnosis Date Noted  . 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    On questran 3x/week   . Primary localized osteoarthrosis of multiple sites 09/04/2019  . Chronic allergic rhinitis 09/04/2019  .  Benign essential hypertension 03/29/2019  . Seasonal allergic rhinitis due to pollen 03/29/2019  . Mild intermittent asthma without complication [id number]  . Dry eye syndrome 03/29/2019  . Family history of premature CAD 03/29/2019  . GAD (generalized anxiety disorder) 04/13/2018  . Mixed hyperlipidemia 04/13/2018  . Primary insomnia 11/16/2017  . Gastroesophageal reflux disease 10/29/2015    Chronic, normal EGD. Chronic PPI   . Postmenopausal atrophic vaginitis 03/13/2013  . Dyspareunia 03/13/2013  . Basal cell carcinoma of skin 02/29/2012    basal cell carcinoma on her left melolabial fold approximately 30 years  ago    Health Maintenance  Topic Date Due  . Hepatitis C Screening  09/17/1951  . MAMMOGRAM  04/21/2020  . TETANUS/TDAP  01/07/2021  . DEXA SCAN  04/21/2021  . COLONOSCOPY  10/26/2026  . INFLUENZA VACCINE  Completed  . PNA vac Low Risk Adult  Completed   Immunization History  Administered Date(s) Administered  . Fluad Quad(high Dose 65+) 06/19/2019  . Influenza Split 08/30/2012, 08/18/2013, 07/18/2014  . Influenza, Seasonal, Injecte, Preservative Fre 07/16/2011  . Influenza,inj,quad, With Preservative 07/01/2015, 06/30/2016, 06/28/2018  . Moderna SARS-COVID-2 Vaccination 10/28/2019  . Pneumococcal Conjugate-13 10/24/2014  . Pneumococcal Polysaccharide-23 10/29/2015  . Tdap 01/08/2011   We updated and reviewed the patient's past history in detail and it is documented below. Allergies: Patient is allergic to aspirin; nsaids; penicillins; and lisinopril. Past Medical History Patient  has a past medical history of Basal cell carcinoma of skin (02/29/2012), Diverticulitis, Essential hypertension (03/29/2019), GERD (gastroesophageal reflux disease) (03/29/2019), Mild intermittent asthma without complication ([id number]), Postmenopausal atrophic vaginitis (03/13/2013), Primary insomnia (11/16/2017), and Seasonal allergic rhinitis due to pollen (03/29/2019). Past Surgical  History Patient  has a past surgical history that includes Colonoscopy. Family History: Patient family history includes Healthy in her son and son; Heart attack in her father; Heart disease in her father; High blood pressure in her brother; Lung cancer in her mother. Social History:  Patient  reports that she has never smoked. She has never used smokeless tobacco. She reports current alcohol use. She reports that she does not use drugs.  Review of Systems: Constitutional: negative for fever or malaise Ophthalmic: negative for photophobia, double vision or loss of vision Cardiovascular: negative for chest pain, dyspnea on exertion, or new LE swelling Respiratory: negative for SOB or persistent cough Gastrointestinal: negative for abdominal pain, change in bowel habits or melena Genitourinary: negative for dysuria or gross hematuria, no abnormal uterine bleeding or disharge Musculoskeletal: negative for new gait disturbance or muscular weakness Integumentary: negative for new or persistent rashes, no breast lumps Neurological: negative for TIA or stroke symptoms Psychiatric: negative for SI or delusions Allergic/Immunologic: negative for hives  Patient Care Team    Relationship Specialty Notifications Start End  Leamon Arnt, MD PCP - General Family Medicine  03/29/19   Lenon Oms, MD Consulting Physician Obstetrics and Gynecology  03/29/19   Randye Lobo,  Londell Moh, [place] Physician Dermatology  08/07/19   Trula Slade, DPM Consulting Physician Podiatry  08/07/19     Objective  Vitals: BP 122/76 (BP Location: Right Arm, Patient Position: Sitting, Cuff Size: Normal)   Pulse 67   Temp (!) 96 F (35.6 C) (Temporal)   Ht 5\' 5"  (1.651 m)   Wt 137 lb 9.6 oz (62.4 kg)   SpO2 97%   BMI 22.90 kg/m  General:  Well developed, well nourished, no acute distress  Psych:  Alert and orientedx3,normal mood and affect HEENT:  Normocephalic, atraumatic, non-icteric sclera, PERRL,  supple neck without adenopathy, mass or thyromegaly Cardiovascular:  Normal S1, S2, RRR without gallop, rub or murmur, nondisplaced PMI Respiratory:  Good breath sounds bilaterally, CTAB with normal respiratory effort Gastrointestinal: normal bowel sounds, soft, non-tender, no noted masses. No HSM MSK: no deformities, contusions. Joints are without erythema or swelling. Spine and CVA region are nontender Skin:  Warm, no rashes or suspicious lesions noted Neurologic:    Mental status is normal. CN 2-11 are normal. Gross motor and sensory exams are normal. Normal gait. No tremor Pelvic Exam: thin vulvar / labial skin with small ecchymosis on left, superior abrasions and small 2-26mm dilated capillary. Bilateral labial fusions. No rash, excoriation, or lesions   Commons side effects, risks, benefits, and alternatives for medications and treatment plan prescribed today were discussed, and the patient expressed understanding of the given instructions. Patient is instructed to call or message via MyChart if he/she has any questions or concerns regarding our treatment plan. No barriers to understanding were identified. We discussed Red Flag symptoms and signs in detail. Patient expressed understanding regarding what to do in case of urgent or emergency type symptoms.   Medication list was reconciled, printed and provided to the patient in AVS. Patient instructions and summary information was reviewed with the patient as documented in the AVS. This note was prepared with assistance of Dragon voice recognition software. Occasional wrong-word or sound-a-like substitutions may have occurred due to the inherent limitations of voice recognition software  This visit occurred during the SARS-CoV-2 public health emergency.  Safety protocols were in place, including screening questions prior to the visit, additional usage of staff PPE, and extensive cleaning of exam room while observing appropriate contact time as  indicated for disinfecting solutions.

## 2019-11-26 ENCOUNTER — Encounter: Payer: Self-pay | Admitting: Family Medicine

## 2019-11-28 ENCOUNTER — Encounter: Payer: Self-pay | Admitting: Family Medicine

## 2019-11-28 LAB — SJOGREN'S SYNDROME ANTIBODS(SSA + SSB)
SSA (Ro) (ENA) Antibody, IgG: <1 AI
SSB (La) (ENA) Antibody, IgG: <1 AI

## 2019-11-28 LAB — ANA: Anti Nuclear Antibody (ANA): NEGATIVE

## 2019-11-28 NOTE — Addendum Note (Signed)
Addended by: Billey Chang on: 11/28/2019 08:12 AM   Modules accepted: Orders

## 2019-11-28 NOTE — Telephone Encounter (Signed)
Patient is suppose to have WBC levels checked in a few weeks from her last visit. When did you want her to come back to recheck labs? Please advise. Thanks.

## 2019-12-20 ENCOUNTER — Encounter: Payer: Self-pay | Admitting: Family Medicine

## 2019-12-26 ENCOUNTER — Other Ambulatory Visit: Payer: Self-pay

## 2019-12-26 ENCOUNTER — Other Ambulatory Visit (INDEPENDENT_AMBULATORY_CARE_PROVIDER_SITE_OTHER): Payer: Medicare PPO

## 2019-12-26 DIAGNOSIS — D72829 Elevated white blood cell count, unspecified: Secondary | ICD-10-CM

## 2019-12-26 LAB — CBC WITH DIFFERENTIAL/PLATELET
Basophils Absolute: 0.1 10*3/uL (ref 0.0–0.1)
Basophils Relative: 0.8 % (ref 0.0–3.0)
Eosinophils Absolute: 0.2 10*3/uL (ref 0.0–0.7)
Eosinophils Relative: 2.2 % (ref 0.0–5.0)
HCT: 42.5 % (ref 36.0–46.0)
Hemoglobin: 14.2 g/dL (ref 12.0–15.0)
Lymphocytes Relative: 30.6 % (ref 12.0–46.0)
Lymphs Abs: 2.2 10*3/uL (ref 0.7–4.0)
MCHC: 33.4 g/dL (ref 30.0–36.0)
MCV: 94.3 fl (ref 78.0–100.0)
Monocytes Absolute: 0.6 10*3/uL (ref 0.1–1.0)
Monocytes Relative: 9.1 % (ref 3.0–12.0)
Neutro Abs: 4 10*3/uL (ref 1.4–7.7)
Neutrophils Relative %: 57.3 % (ref 43.0–77.0)
Platelets: 318 10*3/uL (ref 150.0–400.0)
RBC: 4.51 Mil/uL (ref 3.87–5.11)
RDW: 12.9 % (ref 11.5–15.5)
WBC: 7.1 10*3/uL (ref 4.0–10.5)

## 2020-02-23 ENCOUNTER — Encounter: Payer: Self-pay | Admitting: Family Medicine

## 2020-02-23 MED ORDER — LOSARTAN POTASSIUM 50 MG PO TABS: 50.0000 mg | ORAL_TABLET | Freq: Every day | ORAL | 0 refills | Status: DC

## 2020-03-11 DIAGNOSIS — H2513 Age-related nuclear cataract, bilateral: Secondary | ICD-10-CM | POA: Diagnosis not present

## 2020-03-11 DIAGNOSIS — H16223 Keratoconjunctivitis sicca, not specified as Sjogren's, bilateral: Secondary | ICD-10-CM | POA: Diagnosis not present

## 2020-04-09 DIAGNOSIS — L249 Irritant contact dermatitis, unspecified cause: Secondary | ICD-10-CM | POA: Diagnosis not present

## 2020-04-09 DIAGNOSIS — Z1283 Encounter for screening for malignant neoplasm of skin: Secondary | ICD-10-CM | POA: Diagnosis not present

## 2020-04-09 DIAGNOSIS — L814 Other melanin hyperpigmentation: Secondary | ICD-10-CM | POA: Diagnosis not present

## 2020-04-09 DIAGNOSIS — Z85828 Personal history of other malignant neoplasm of skin: Secondary | ICD-10-CM | POA: Diagnosis not present

## 2020-04-09 DIAGNOSIS — L821 Other seborrheic keratosis: Secondary | ICD-10-CM | POA: Diagnosis not present

## 2020-04-09 DIAGNOSIS — D1801 Hemangioma of skin and subcutaneous tissue: Secondary | ICD-10-CM | POA: Diagnosis not present

## 2020-04-17 DIAGNOSIS — H02836 Dermatochalasis of left eye, unspecified eyelid: Secondary | ICD-10-CM | POA: Diagnosis not present

## 2020-04-17 DIAGNOSIS — H16223 Keratoconjunctivitis sicca, not specified as Sjogren's, bilateral: Secondary | ICD-10-CM | POA: Diagnosis not present

## 2020-04-17 DIAGNOSIS — H02833 Dermatochalasis of right eye, unspecified eyelid: Secondary | ICD-10-CM | POA: Diagnosis not present

## 2020-04-17 DIAGNOSIS — H2513 Age-related nuclear cataract, bilateral: Secondary | ICD-10-CM | POA: Diagnosis not present

## 2020-05-03 DIAGNOSIS — Z1231 Encounter for screening mammogram for malignant neoplasm of breast: Secondary | ICD-10-CM | POA: Diagnosis not present

## 2020-05-03 LAB — HM MAMMOGRAPHY

## 2020-05-21 ENCOUNTER — Other Ambulatory Visit: Payer: Self-pay | Admitting: Family Medicine

## 2020-05-21 MED ORDER — LOSARTAN POTASSIUM 50 MG PO TABS
50.0000 mg | ORAL_TABLET | Freq: Every day | ORAL | 3 refills | Status: DC
Start: 2020-05-21 — End: 2021-04-28

## 2020-05-22 ENCOUNTER — Ambulatory Visit: Payer: Medicare PPO | Admitting: Family Medicine

## 2020-05-22 ENCOUNTER — Other Ambulatory Visit: Payer: Self-pay

## 2020-05-22 ENCOUNTER — Encounter: Payer: Self-pay | Admitting: Family Medicine

## 2020-05-22 VITALS — BP 128/78 | HR 63 | Temp 97.4°F | Resp 18 | Ht 65.0 in | Wt 138.4 lb

## 2020-05-22 DIAGNOSIS — N952 Postmenopausal atrophic vaginitis: Secondary | ICD-10-CM | POA: Diagnosis not present

## 2020-05-22 DIAGNOSIS — M545 Low back pain, unspecified: Secondary | ICD-10-CM

## 2020-05-22 DIAGNOSIS — F5101 Primary insomnia: Secondary | ICD-10-CM

## 2020-05-22 DIAGNOSIS — I10 Essential (primary) hypertension: Secondary | ICD-10-CM | POA: Diagnosis not present

## 2020-05-22 DIAGNOSIS — J301 Allergic rhinitis due to pollen: Secondary | ICD-10-CM | POA: Diagnosis not present

## 2020-05-22 DIAGNOSIS — G8929 Other chronic pain: Secondary | ICD-10-CM

## 2020-05-22 NOTE — Patient Instructions (Addendum)
Please return in 6 months for your annual complete physical; please come fasting.  I've put in a referral for physical therapy here in our office. We will call you soon to get you scheduled. I hope it helps again.   Good to see you! If you have any questions or concerns, please don't hesitate to send me a message via MyChart or call the office at [phone]. Thank you for visiting with [place] today! It's our pleasure caring for you.

## 2020-05-22 NOTE — Progress Notes (Addendum)
Subjective  CC:  Chief Complaint  Patient presents with  . Hypertension    HPI: Tina Kelley is a 69 y.o. female who presents to the office today to address the problems listed above in the chief complaint.  Hypertension f/u: Control is good . Pt reports she is doing well. taking medications as instructed, no medication side effects noted, no TIAs, no chest pain on exertion, no dyspnea on exertion, no swelling of ankles. On low dose ace and bb. She denies adverse effects from his BP medications. Compliance with medication is good.   Insomnia: uses ambien 2-3x/week, 1/2 pill. Works well.   Atrophic vaginitis treated with topical estrogens: working well. sxs are improving.   SAR and chronic pnd causing throat irritation. Saw ENT. Mild improvement on prn allegra and flonase. No masses or infection on direct laryngoscopy by pt report.   HM: mammo reviewed. Nl at [facility]- from care everywhere.   History of chronic low back pain s/p ablation in past. Did well for about a year but now active again; exacerbated by move and lifting and yard work. No radicular sxs or LE weakness. PT has helped in past.   Assessment  1. Benign essential hypertension   2. Postmenopausal atrophic vaginitis   3. Chronic midline low back pain without sciatica   4. Primary insomnia   5. Seasonal allergic rhinitis due to pollen      Plan    Hypertension f/u: BP control is well controlled. No change is needed. Continue same meds.   Vaginitis, atrophic f/u: improved. Continue vaginal estrogen creams  Insmonia: stable on prn ambien. Chronic. No AEs  SAR: continue allergy meds.   Low back pain: refer for PT.   Education regarding management of these chronic disease states was given. Management strategies discussed on successive visits include dietary and exercise recommendations, goals of achieving and maintaining IBW, and lifestyle modifications aiming for adequate sleep and minimizing stressors.   Follow  up: Return in about 6 months (around 11/22/2020) for complete physical.  Orders Placed This Encounter  Procedures  . Ambulatory referral to Physical Therapy   No orders of the defined types were placed in this encounter.     BP Readings from Last 3 Encounters:  05/22/20 128/78  11/24/19 122/76  10/27/19 135/82   Wt Readings from Last 3 Encounters:  05/22/20 138 lb 6.4 oz (62.8 kg)  11/24/19 137 lb 9.6 oz (62.4 kg)  10/27/19 136 lb (61.7 kg)    Lab Results  Component Value Date   CHOL 198 11/24/2019   CHOL 205 (A) 11/22/2018   Lab Results  Component Value Date   HDL 68.00 11/24/2019   HDL 74 (A) 11/22/2018   Lab Results  Component Value Date   LDLCALC 92 11/24/2019   LDLCALC 98 11/22/2018   Lab Results  Component Value Date   TRIG 190.0 (H) 11/24/2019   TRIG 166 (A) 11/22/2018   Lab Results  Component Value Date   CHOLHDL 3 11/24/2019   No results found for: LDLDIRECT Lab Results  Component Value Date   CREATININE 0.95 11/24/2019   BUN 13 11/24/2019   NA 138 11/24/2019   K 4.1 11/24/2019   CL 101 11/24/2019   CO2 28 11/24/2019    The 10-year ASCVD risk score Mikey Bussing DC Jr., et al., 2013) is: 12.1%   Values used to calculate the score:     Age: 78 years     Sex: Female     Is Non-Hispanic African  American: No     Diabetic: No     Tobacco smoker: No     Systolic Blood Pressure: 086 mmHg     Is BP treated: Yes     HDL Cholesterol: 68 mg/dL     Total Cholesterol: 198 mg/dL  I reviewed the patients updated PMH, FH, and SocHx.    Patient Active Problem List   Diagnosis Date Noted  . Benign essential hypertension 03/29/2019    Priority: High  . Family history of premature CAD 03/29/2019    Priority: High  . GAD (generalized anxiety disorder) 04/13/2018    Priority: High  . Mixed hyperlipidemia 04/13/2018    Priority: High  . Primary insomnia 11/16/2017    Priority: High  . 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 Primary  localized osteoarthrosis of multiple sites 09/04/2019    Priority: Medium  . Mild intermittent asthma without complication 76/19/5093    Priority: Medium  . Gastroesophageal reflux disease 10/29/2015    Priority: Medium  . Dyspareunia 03/13/2013    Priority: Medium  . Basal cell carcinoma of skin 02/29/2012    Priority: Medium  . Chronic allergic rhinitis 09/04/2019    Priority: Low  . Seasonal allergic rhinitis due to pollen 03/29/2019    Priority: Low  . Dry eye syndrome 03/29/2019    Priority: Low  . Postmenopausal atrophic vaginitis 03/13/2013    Priority: Low    Allergies: Aspirin, Nsaids, Penicillins, and Lisinopril  Social History: Patient  reports that she has never smoked. She has never used smokeless tobacco. She reports current alcohol use. She reports that she does not use drugs.  Current Meds  Medication Sig  . albuterol (VENTOLIN HFA) 108 (90 Base) MCG/ACT inhaler   . buPROPion (WELLBUTRIN SR) 150 MG 12 hr tablet   . cholestyramine (QUESTRAN) 4 g packet cholestyramine (with sugar) 4 gram powder for susp in a packet  . conjugated estrogens (PREMARIN) vaginal cream Place vaginally 3 (three) times a week.  . cycloSPORINE (RESTASIS) 0.05 % ophthalmic emulsion Place 1 drop into both eyes 2 (two) times daily.  Marland Kitchen EPINEPHrine (EPIPEN 2-PAK) 0.3 mg/0.3 mL IJ SOAJ injection Inject 0.3 mLs (0.3 mg total) into the muscle as needed for anaphylaxis. Use as instructed.  Marland Kitchen esomeprazole (NEXIUM) 20 MG capsule Take 1 -2 capsules daily as needed  . fluticasone (FLONASE) 50 MCG/ACT nasal spray Place into the nose.  . Lactobacillus Rhamnosus, GG, (CULTURELLE) CAPS Take by mouth.  . losartan (COZAAR) 50 MG tablet Take 1 tablet (50 mg total) by mouth daily.  . metoprolol tartrate (LOPRESSOR) 50 MG tablet Take by mouth.  Marland Kitchen PRESCRIPTION MEDICATION Cholestyramine for Oral Suspension  . rosuvastatin (CRESTOR) 10 MG tablet Take by mouth.  . zolpidem (AMBIEN CR) 12.5 MG CR tablet Take by  mouth.    Review of Systems: Cardiovascular: negative for chest pain, palpitations, leg swelling, orthopnea Respiratory: negative for SOB, wheezing or persistent cough Gastrointestinal: negative for abdominal pain Genitourinary: negative for dysuria or gross hematuria  Objective  Vitals: BP 128/78   Pulse 63   Temp (!) 97.4 F (36.3 C) (Temporal)   Resp 18   Ht 5\' 5"  (1.651 m)   Wt 138 lb 6.4 oz (62.8 kg)   SpO2 97%   BMI 23.03 kg/m  General: no acute distress  Psych:  Alert and oriented, normal mood and affect HEENT:  Normocephalic, atraumatic, supple neck  Cardiovascular:  RRR without murmur. no edema Respiratory:  Good breath sounds  bilaterally, CTAB with normal respiratory effort Skin:  Warm, no rashes Neurologic:   Mental status is normal  Commons side effects, risks, benefits, and alternatives for medications and treatment plan prescribed today were discussed, and the patient expressed understanding of the given instructions. Patient is instructed to call or message via MyChart if he/she has any questions or concerns regarding our treatment plan. No barriers to understanding were identified. We discussed Red Flag symptoms and signs in detail. Patient expressed understanding regarding what to do in case of urgent or emergency type symptoms.   Medication list was reconciled, printed and provided to the patient in AVS. Patient instructions and summary information was reviewed with the patient as documented in the AVS. This note was prepared with assistance of Dragon voice recognition software. Occasional wrong-word or sound-a-like substitutions may have occurred due to the inherent limitations of voice recognition software  This visit occurred during the SARS-CoV-2 public health emergency.  Safety protocols were in place, including screening questions prior to the visit, additional usage of staff PPE, and extensive cleaning of exam room while observing appropriate contact time as  indicated for disinfecting solutions.

## 2020-05-23 ENCOUNTER — Ambulatory Visit: Payer: Medicare PPO | Admitting: Family Medicine

## 2020-05-23 ENCOUNTER — Encounter: Payer: Self-pay | Admitting: Family Medicine

## 2020-06-10 ENCOUNTER — Ambulatory Visit: Payer: Medicare PPO | Admitting: Physical Therapy

## 2020-06-10 ENCOUNTER — Other Ambulatory Visit: Payer: Self-pay

## 2020-06-10 ENCOUNTER — Encounter: Payer: Self-pay | Admitting: Physical Therapy

## 2020-06-10 DIAGNOSIS — G8929 Other chronic pain: Secondary | ICD-10-CM

## 2020-06-10 DIAGNOSIS — M545 Low back pain: Secondary | ICD-10-CM | POA: Diagnosis not present

## 2020-06-10 NOTE — Patient Instructions (Signed)
Access Code: [email or web address] URL: [email or web address] Date: 06/10/2020 Prepared by: Lyndee Hensen  Exercises Supine Single Knee to Chest Stretch - 2 x daily - 3 reps - 30 hold Cat Cow - 1 x daily - 2 sets - 10 reps - 5 hold Sidelying Hip Abduction - 1 x daily - 2 sets - 10 reps Hooklying Clamshell with Resistance - 1 x daily - 2 sets - 10 reps Supine Bridge - 1 x daily - 2 sets - 10 reps Supine Transversus Abdominis Bracing - Hands on Ground - 2 x daily - 1 sets - 10 reps

## 2020-06-11 ENCOUNTER — Ambulatory Visit (INDEPENDENT_AMBULATORY_CARE_PROVIDER_SITE_OTHER): Payer: Medicare PPO

## 2020-06-11 ENCOUNTER — Encounter: Payer: Self-pay | Admitting: Family Medicine

## 2020-06-11 ENCOUNTER — Other Ambulatory Visit: Payer: Self-pay

## 2020-06-11 DIAGNOSIS — Z23 Encounter for immunization: Secondary | ICD-10-CM

## 2020-06-11 DIAGNOSIS — N952 Postmenopausal atrophic vaginitis: Secondary | ICD-10-CM | POA: Diagnosis not present

## 2020-06-11 DIAGNOSIS — L309 Dermatitis, unspecified: Secondary | ICD-10-CM | POA: Diagnosis not present

## 2020-06-12 ENCOUNTER — Encounter: Payer: Self-pay | Admitting: Physical Therapy

## 2020-06-12 NOTE — Therapy (Signed)
[facility] [address] [place], [place], [address] Phone: [phone]   Fax:  [phone]  Physical Therapy Evaluation  Patient Details  Name: Tina Kelley MRN: [id number] Date of Birth: 1951/08/20 Referring Provider (PT): Billey Chang   Encounter Date: 06/10/2020   PT End of Session - 06/12/20 1434    Visit Number 1    Number of Visits 12    Date for PT Re-Evaluation 07/22/20    Authorization Type [employer]    PT Start Time 0935    PT Stop Time 1014    PT Time Calculation (min) 39 min    Activity Tolerance Patient tolerated treatment well    Behavior During Therapy [facility] for tasks assessed/performed           Past Medical History:  Diagnosis Date  . Basal cell carcinoma of skin 02/29/2012   basal cell carcinoma on her left melolabial fold approximately 30 years  ago  . Diverticulitis   . Essential hypertension 03/29/2019  . GERD (gastroesophageal reflux disease) 03/29/2019   Chronic, normal EGD. Chronic PPI  . Mild intermittent asthma without complication 04/29/5002  . Postmenopausal atrophic vaginitis 03/13/2013  . Primary insomnia 11/16/2017  . Seasonal allergic rhinitis due to pollen 03/29/2019    Past Surgical History:  Procedure Laterality Date  . COLONOSCOPY     x2    There were no vitals filed for this visit.    Subjective Assessment - 06/12/20 1433    Subjective Central mid back pain, used to have on R.    Patient Stated Goals decreased pain, increased exercise.    Currently in Pain? Yes    Pain Score 7     Pain Location Back    Pain Orientation Right;Left    Pain Descriptors / Indicators Aching    Pain Type Chronic pain    Pain Onset More than a month ago    Pain Frequency Intermittent    Aggravating Factors  first thing in AM    Pain Relieving Factors movement, exercise              [facility] PT Assessment - 06/12/20 0001      Assessment   Medical Diagnosis Low Back pain    Referring Provider (PT) Billey Chang    Prior Therapy in the past       Balance Screen   Has the patient fallen in the past 6 months No      Prior Function   Level of Independence Independent      Cognition   Overall Cognitive Status Within Functional Limits for tasks assessed      AROM   Overall AROM Comments Lumbar: ext and flex: mild limitation;  Hips: WFL,  ankle: WFL, great toe ext: mod limitation       Strength   Overall Strength Comments hips: 4-/5, core: moderate postural changes seen with bridge exercise.       Palpation   Palpation comment Mild soreness in central lumbar region, as well as into bil lumbar musculature and glute med       Special Tests   Other special tests Neg SLR                       Objective measurements completed on examination: See above findings.       [facility] Adult PT Treatment/Exercise - 06/12/20 0001      Exercises   Exercises Lumbar      Lumbar  Exercises: Stretches   Single Knee to Chest Stretch 2 reps;30 seconds      Lumbar Exercises: Supine   Ab Set 10 reps    Clam 20 reps    Clam Limitations GTB    Bridge 10 reps      Lumbar Exercises: Quadruped   Madcat/Old Horse 20 reps                  PT Education - 06/12/20 1434    Education Details PT POC, exam findings, HEP    Person(s) Educated Patient    Methods Explanation;Demonstration;Verbal cues;Handout    Comprehension Verbalized understanding;Returned demonstration;Verbal cues required;Need further instruction            PT Short Term Goals - 06/12/20 1436      PT SHORT TERM GOAL #1   Title Pt to be independent with initial HEP    Time 2    Period Weeks    Status New    Target Date 06/24/20             PT Long Term Goals - 06/12/20 1437      PT LONG TERM GOAL #1   Title Pt to be independent with final HEP for back and foot pain    Time 6    Period Weeks    Status New    Target Date 07/22/20      PT LONG TERM GOAL #2   Title Pt to report decreased pain in low  back to 0-2/10 with activity    Time 6    Period Weeks    Status New    Target Date 07/22/20      PT LONG TERM GOAL #3   Title Pt to demo improved strength of hips to be at least 4+/5, and core , to have minimal/no postural changes with stability exercises.    Time 6    Period Weeks    Status New    Target Date 07/22/20                  Plan - 06/12/20 1604    Clinical Impression Statement Pt presents with primary complaint of increased pain in low back, exacerbation of chronic issue. Pt with decreased lumbar ROM, and decreased strength of core and hips, with lack of effective HEP for her Dx. Pt also states bil pl fascia pain that is bothering her at this time. She has tightness in bil Pl fasc and limited gr toe extension. Pt will also benefit from footwear recommendation. Pt with decreased ability for full functional activities and will benefit from skilled PT to improve .    Personal Factors and Comorbidities Time since onset of injury/illness/exacerbation    Examination-Activity Limitations Locomotion Level;Bend;Carry;Squat;Stand;Lift    Examination-Participation Restrictions Meal Prep;Cleaning;Community Activity;Shop;Yard Work;Laundry    Stability/Clinical Decision Making Stable/Uncomplicated    Clinical Decision Making Low    Rehab Potential Good    PT Frequency 2x / week    PT Duration 6 weeks    PT Treatment/Interventions ADLs/Self Care Home Management;Cryotherapy;Electrical Stimulation;Gait training;DME Instruction;Ultrasound;Traction;Moist Heat;Iontophoresis 4mg /ml Dexamethasone;Stair training;Functional mobility training;Therapeutic activities;Therapeutic exercise;Balance training;Neuromuscular re-education;Manual techniques;Patient/family education;Orthotic Fit/Training;Passive range of motion;Joint Manipulations;Dry needling;Splinting;Taping;Spinal Manipulations;Vasopneumatic Device    Consulted and Agree with Plan of Care Patient           Patient will benefit  from skilled therapeutic intervention in order to improve the following deficits and impairments:  Decreased range of motion, Increased muscle spasms, Decreased activity tolerance, Pain, Impaired flexibility,  Improper body mechanics, Decreased strength, Decreased mobility, Hypomobility  Visit Diagnosis: Chronic bilateral low back pain without sciatica     Problem List Patient Active Problem List   Diagnosis Date Noted  . 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 Primary localized osteoarthrosis of multiple sites 09/04/2019  . Chronic allergic rhinitis 09/04/2019  . Benign essential hypertension 03/29/2019  . Seasonal allergic rhinitis due to pollen 03/29/2019  . Mild intermittent asthma without complication 40/06/2724  . Dry eye syndrome 03/29/2019  . Family history of premature CAD 03/29/2019  . GAD (generalized anxiety disorder) 04/13/2018  . Mixed hyperlipidemia 04/13/2018  . Primary insomnia 11/16/2017  . Gastroesophageal reflux disease 10/29/2015  . Postmenopausal atrophic vaginitis 03/13/2013  . Dyspareunia 03/13/2013  . Basal cell carcinoma of skin 02/29/2012    Lyndee Hensen, PT, DPT 4:11 PM  06/12/20    [facility] [place], [place], [address] Phone: [phone]   Fax:  [phone]  Name: Shalawn Wynder MRN: [id number] Date of Birth: 1950/12/20

## 2020-06-18 DIAGNOSIS — E041 Nontoxic single thyroid nodule: Secondary | ICD-10-CM | POA: Diagnosis not present

## 2020-06-19 ENCOUNTER — Other Ambulatory Visit: Payer: Self-pay

## 2020-06-19 ENCOUNTER — Ambulatory Visit: Payer: Medicare PPO | Admitting: Physical Therapy

## 2020-06-19 ENCOUNTER — Encounter: Payer: Self-pay | Admitting: Physical Therapy

## 2020-06-19 DIAGNOSIS — M545 Low back pain, unspecified: Secondary | ICD-10-CM

## 2020-06-19 DIAGNOSIS — G8929 Other chronic pain: Secondary | ICD-10-CM | POA: Diagnosis not present

## 2020-06-23 ENCOUNTER — Encounter: Payer: Self-pay | Admitting: Physical Therapy

## 2020-06-23 NOTE — Therapy (Signed)
[facility] [address] [place], [place], [address] Phone: [phone]   Fax:  [phone]  Physical Therapy Treatment  Patient Details  Name: Tina Kelley MRN: [id number] Date of Birth: Dec 20, 1950 Referring Provider (PT): Billey Chang   Encounter Date: 06/19/2020   PT End of Session - 06/23/20 2131    Visit Number 2    Number of Visits 12    Date for PT Re-Evaluation 07/22/20    Authorization Type [employer]    PT Start Time 1105    PT Stop Time 1145    PT Time Calculation (min) 40 min    Activity Tolerance Patient tolerated treatment well    Behavior During Therapy [facility] for tasks assessed/performed           Past Medical History:  Diagnosis Date  . Basal cell carcinoma of skin 02/29/2012   basal cell carcinoma on her left melolabial fold approximately 30 years  ago  . Diverticulitis   . Essential hypertension 03/29/2019  . GERD (gastroesophageal reflux disease) 03/29/2019   Chronic, normal EGD. Chronic PPI  . Mild intermittent asthma without complication 12/01/91  . Postmenopausal atrophic vaginitis 03/13/2013  . Primary insomnia 11/16/2017  . Seasonal allergic rhinitis due to pollen 03/29/2019    Past Surgical History:  Procedure Laterality Date  . COLONOSCOPY     x2    There were no vitals filed for this visit.   Subjective Assessment - 06/23/20 2130    Subjective Pt states minimal pain today. Has been doing HEP, and trying to stretch in the am.    Patient Stated Goals decreased pain, increased exercise.    Currently in Pain? Yes    Pain Score 3     Pain Location Back    Pain Orientation Right;Left    Pain Descriptors / Indicators Aching    Pain Type Chronic pain    Pain Onset More than a month ago    Pain Frequency Intermittent                             [facility] Adult PT Treatment/Exercise - 06/23/20 0001      Exercises   Exercises Lumbar      Lumbar Exercises: Stretches   Single Knee to Chest  Stretch 2 reps;30 seconds    Pelvic Tilt 20 reps    Piriformis Stretch 3 reps;30 seconds    Piriformis Stretch Limitations supine fig 4 ;     Other Lumbar Stretch Exercise self PF stretch x 2 min; seated HR x 20 for foot pain       Lumbar Exercises: Aerobic   Recumbent Bike L1 x 8 min;       Lumbar Exercises: Standing   Heel Raises 15 reps    Other Standing Lumbar Exercises HIp abd 2 x 10 bil;       Lumbar Exercises: Supine   Clam 20 reps    Clam Limitations GTB    Bent Knee Raise 20 reps    Bent Knee Raise Limitations GTB    Bridge 20 reps    Straight Leg Raise 10 reps      Lumbar Exercises: Sidelying   Hip Abduction 10 reps;Both      Lumbar Exercises: Quadruped   Madcat/Old Horse 20 reps                    PT Short Term Goals - 06/12/20 1436  PT SHORT TERM GOAL #1   Title Pt to be independent with initial HEP    Time 2    Period Weeks    Status New    Target Date 06/24/20             PT Long Term Goals - 06/12/20 1437      PT LONG TERM GOAL #1   Title Pt to be independent with final HEP for back and foot pain    Time 6    Period Weeks    Status New    Target Date 07/22/20      PT LONG TERM GOAL #2   Title Pt to report decreased pain in low back to 0-2/10 with activity    Time 6    Period Weeks    Status New    Target Date 07/22/20      PT LONG TERM GOAL #3   Title Pt to demo improved strength of hips to be at least 4+/5, and core , to have minimal/no postural changes with stability exercises.    Time 6    Period Weeks    Status New    Target Date 07/22/20                 Plan - 06/23/20 2138    Clinical Impression Statement Pt with minimal soreness with activities today. Able to progress ther ex. Added foot stretch and strenghtening for foot pain. Pt will bring shoes next visit for assessment/recommnedation. Plan to progress core strength as tolerated.    Personal Factors and Comorbidities Time since onset of  injury/illness/exacerbation    Examination-Activity Limitations Locomotion Level;Bend;Carry;Squat;Stand;Lift    Examination-Participation Restrictions Meal Prep;Cleaning;Community Activity;Shop;Yard Work;Laundry    Stability/Clinical Decision Making Stable/Uncomplicated    Rehab Potential Good    PT Frequency 2x / week    PT Duration 6 weeks    PT Treatment/Interventions ADLs/Self Care Home Management;Cryotherapy;Electrical Stimulation;Gait training;DME Instruction;Ultrasound;Traction;Moist Heat;Iontophoresis 4mg /ml Dexamethasone;Stair training;Functional mobility training;Therapeutic activities;Therapeutic exercise;Balance training;Neuromuscular re-education;Manual techniques;Patient/family education;Orthotic Fit/Training;Passive range of motion;Joint Manipulations;Dry needling;Splinting;Taping;Spinal Manipulations;Vasopneumatic Device    Consulted and Agree with Plan of Care Patient           Patient will benefit from skilled therapeutic intervention in order to improve the following deficits and impairments:  Decreased range of motion, Increased muscle spasms, Decreased activity tolerance, Pain, Impaired flexibility, Improper body mechanics, Decreased strength, Decreased mobility, Hypomobility  Visit Diagnosis: Chronic bilateral low back pain without sciatica     Problem List Patient Active Problem List   Diagnosis Date Noted  . 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 Primary localized osteoarthrosis of multiple sites 09/04/2019  . Chronic allergic rhinitis 09/04/2019  . Benign essential hypertension 03/29/2019  . Seasonal allergic rhinitis due to pollen 03/29/2019  . Mild intermittent asthma without complication 74/08/8785  . Dry eye syndrome 03/29/2019  . Family history of premature CAD 03/29/2019  . GAD (generalized anxiety disorder) 04/13/2018  . Mixed hyperlipidemia 04/13/2018  . Primary insomnia 11/16/2017  . Gastroesophageal reflux disease 10/29/2015  . Postmenopausal  atrophic vaginitis 03/13/2013  . Dyspareunia 03/13/2013  . Basal cell carcinoma of skin 02/29/2012    Lyndee Hensen, PT, DPT 9:39 PM  06/23/20    [facility] [place], [place], [address] Phone: [phone]   Fax:  [phone]  Name: Tina Kelley MRN: [id number] Date of Birth: 31-Mar-1951

## 2020-06-24 ENCOUNTER — Encounter: Payer: Medicare PPO | Admitting: Physical Therapy

## 2020-06-27 ENCOUNTER — Encounter: Payer: Self-pay | Admitting: Physical Therapy

## 2020-06-27 ENCOUNTER — Other Ambulatory Visit: Payer: Self-pay

## 2020-06-27 ENCOUNTER — Ambulatory Visit (INDEPENDENT_AMBULATORY_CARE_PROVIDER_SITE_OTHER): Payer: Medicare PPO | Admitting: Physical Therapy

## 2020-06-27 DIAGNOSIS — G8929 Other chronic pain: Secondary | ICD-10-CM

## 2020-06-27 DIAGNOSIS — M545 Low back pain: Secondary | ICD-10-CM

## 2020-06-27 NOTE — Therapy (Signed)
[facility] [address] [place], [place], [address] Phone: [phone]   Fax:  [phone]  Physical Therapy Treatment  Patient Details  Name: Tina Kelley MRN: [id number] Date of Birth: Jan 03, 1951 Referring Provider (PT): Billey Chang   Encounter Date: 06/27/2020   PT End of Session - 06/27/20 1336    Visit Number 3    Number of Visits 12    Date for PT Re-Evaluation 07/22/20    Authorization Type [employer]    PT Start Time 0930    PT Stop Time 1013    PT Time Calculation (min) 43 min    Activity Tolerance Patient tolerated treatment well    Behavior During Therapy [facility] for tasks assessed/performed           Past Medical History:  Diagnosis Date   Basal cell carcinoma of skin 02/29/2012   basal cell carcinoma on her left melolabial fold approximately 30 years  ago   Diverticulitis    Essential hypertension 03/29/2019   GERD (gastroesophageal reflux disease) 03/29/2019   Chronic, normal EGD. Chronic PPI   Mild intermittent asthma without complication 4/0/1027   Postmenopausal atrophic vaginitis 03/13/2013   Primary insomnia 11/16/2017   Seasonal allergic rhinitis due to pollen 03/29/2019    Past Surgical History:  Procedure Laterality Date   COLONOSCOPY     x2    There were no vitals filed for this visit.   Subjective Assessment - 06/27/20 1034    Subjective Pt states minimal pain in back, is going to clean house today. Feet are mildly sore, daily.    Currently in Pain? No/denies    Pain Score 0-No pain                             [facility] Adult PT Treatment/Exercise - 06/27/20 0926      Exercises   Exercises Lumbar      Lumbar Exercises: Stretches   Single Knee to Chest Stretch 2 reps;30 seconds    Pelvic Tilt 20 reps    Piriformis Stretch 3 reps;30 seconds    Piriformis Stretch Limitations supine fig 4 ;     Other Lumbar Stretch Exercise seated HR x 20      Lumbar Exercises: Aerobic    Recumbent Bike L2 x 8 min;       Lumbar Exercises: Standing   Heel Raises 20 reps    Other Standing Lumbar Exercises HIp abd 2 x 10 bil;       Lumbar Exercises: Supine   Clam 20 reps    Clam Limitations GTB    Bent Knee Raise 20 reps    Bent Knee Raise Limitations GTB    Bridge --    Bridge with clamshell 20 reps    Straight Leg Raise 10 reps      Lumbar Exercises: Sidelying   Hip Abduction 10 reps;Both      Lumbar Exercises: Quadruped   Madcat/Old Horse --      Manual Therapy   Manual Therapy Joint mobilization;Soft tissue mobilization;Passive ROM    Passive ROM Gr toe mobs, Gr toe mobility, PFascia stretch;                     PT Short Term Goals - 06/12/20 1436      PT SHORT TERM GOAL #1   Title Pt to be independent with initial HEP    Time 2    Period  Weeks    Status New    Target Date 06/24/20             PT Long Term Goals - 06/12/20 1437      PT LONG TERM GOAL #1   Title Pt to be independent with final HEP for back and foot pain    Time 6    Period Weeks    Status New    Target Date 07/22/20      PT LONG TERM GOAL #2   Title Pt to report decreased pain in low back to 0-2/10 with activity    Time 6    Period Weeks    Status New    Target Date 07/22/20      PT LONG TERM GOAL #3   Title Pt to demo improved strength of hips to be at least 4+/5, and core , to have minimal/no postural changes with stability exercises.    Time 6    Period Weeks    Status New    Target Date 07/22/20                 Plan - 06/27/20 1340    Clinical Impression Statement Pt with minimal soreness today in back. Progressed ther ex for strengthening. Discussed footwear recommenndatoins for footpain and importance of continued HEp for foot mobility and strength. Plan to see pt 1-2 more visits, likely d/c if pain is improving.    Personal Factors and Comorbidities Time since onset of injury/illness/exacerbation    Examination-Activity Limitations Locomotion  Level;Bend;Carry;Squat;Stand;Lift    Examination-Participation Restrictions Meal Prep;Cleaning;Community Activity;Shop;Yard Work;Laundry    Stability/Clinical Decision Making Stable/Uncomplicated    Rehab Potential Good    PT Frequency 2x / week    PT Duration 6 weeks    PT Treatment/Interventions ADLs/Self Care Home Management;Cryotherapy;Electrical Stimulation;Gait training;DME Instruction;Ultrasound;Traction;Moist Heat;Iontophoresis 4mg /ml Dexamethasone;Stair training;Functional mobility training;Therapeutic activities;Therapeutic exercise;Balance training;Neuromuscular re-education;Manual techniques;Patient/family education;Orthotic Fit/Training;Passive range of motion;Joint Manipulations;Dry needling;Splinting;Taping;Spinal Manipulations;Vasopneumatic Device    Consulted and Agree with Plan of Care Patient           Patient will benefit from skilled therapeutic intervention in order to improve the following deficits and impairments:  Decreased range of motion, Increased muscle spasms, Decreased activity tolerance, Pain, Impaired flexibility, Improper body mechanics, Decreased strength, Decreased mobility, Hypomobility  Visit Diagnosis: Chronic bilateral low back pain without sciatica     Problem List Patient Active Problem List   Diagnosis Date Noted   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   Primary localized osteoarthrosis of multiple sites 09/04/2019   Chronic allergic rhinitis 09/04/2019   Benign essential hypertension 03/29/2019   Seasonal allergic rhinitis due to pollen 03/29/2019   Mild intermittent asthma without complication 63/84/5364   Dry eye syndrome 03/29/2019   Family history of premature CAD 03/29/2019   GAD (generalized anxiety disorder) 04/13/2018   Mixed hyperlipidemia 04/13/2018   Primary insomnia 11/16/2017   Gastroesophageal reflux disease 10/29/2015   Postmenopausal atrophic vaginitis 03/13/2013   Dyspareunia 03/13/2013   Basal cell  carcinoma of skin 02/29/2012    Lyndee Hensen, PT, DPT 1:43 PM  06/27/20    [facility] [address] [place], [place], [address] Phone: [phone]   Fax:  [phone]  Name: Tina Kelley MRN: [id number] Date of Birth: 1951/03/22

## 2020-07-08 ENCOUNTER — Encounter: Payer: Self-pay | Admitting: Physical Therapy

## 2020-07-08 ENCOUNTER — Ambulatory Visit: Payer: Medicare PPO | Admitting: Physical Therapy

## 2020-07-08 ENCOUNTER — Other Ambulatory Visit: Payer: Self-pay

## 2020-07-08 DIAGNOSIS — G8929 Other chronic pain: Secondary | ICD-10-CM | POA: Diagnosis not present

## 2020-07-08 DIAGNOSIS — M545 Low back pain, unspecified: Secondary | ICD-10-CM | POA: Diagnosis not present

## 2020-07-08 NOTE — Therapy (Signed)
[facility] [address] [place], [place], [address] Phone: [phone]   Fax:  [phone]  Physical Therapy Treatment/Discharge   Patient Details  Name: Tina Kelley MRN: [id number] Date of Birth: [birth year] Referring Provider (PT): Billey Chang   Encounter Date: 07/08/2020   PT End of Session - 07/08/20 1022    Visit Number 4    Number of Visits 12    Date for PT Re-Evaluation 07/22/20    Authorization Type [employer]    PT Start Time 1017    PT Stop Time 1100    PT Time Calculation (min) 43 min    Activity Tolerance Patient tolerated treatment well    Behavior During Therapy [facility] for tasks assessed/performed           Past Medical History:  Diagnosis Date  . Basal cell carcinoma of skin 02/29/2012   basal cell carcinoma on her left melolabial fold approximately 30 years  ago  . Diverticulitis   . Essential hypertension 03/29/2019  . GERD (gastroesophageal reflux disease) 03/29/2019   Chronic, normal EGD. Chronic PPI  . Mild intermittent asthma without complication 10/03/3844  . Postmenopausal atrophic vaginitis 03/13/2013  . Primary insomnia 11/16/2017  . Seasonal allergic rhinitis due to pollen 03/29/2019    Past Surgical History:  Procedure Laterality Date  . COLONOSCOPY     x2    There were no vitals filed for this visit.   Subjective Assessment - 07/08/20 1020    Subjective Pt states mild pain in back, not constant, does have more time without pain. Was sore this am, using heat patch, which she thinks helps.    Currently in Pain? Yes    Pain Score 1     Pain Location Back    Pain Orientation Right;Left    Pain Descriptors / Indicators Aching    Pain Type Chronic pain    Pain Onset More than a month ago    Pain Frequency Intermittent                             [facility] Adult PT Treatment/Exercise - 07/08/20 1025      Exercises   Exercises Lumbar      Lumbar Exercises: Stretches   Single Knee to  Chest Stretch 2 reps;30 seconds    Pelvic Tilt --    Piriformis Stretch 3 reps;30 seconds    Piriformis Stretch Limitations supine fig 4 ;     Other Lumbar Stretch Exercise --      Lumbar Exercises: Aerobic   Recumbent Bike L2 x 8 min;       Lumbar Exercises: Standing   Heel Raises 20 reps    Other Standing Lumbar Exercises HIp abd 2 x 10 bil;       Lumbar Exercises: Supine   Clam 20 reps    Clam Limitations GTB    Bent Knee Raise 20 reps    Bent Knee Raise Limitations GTB    Bridge with clamshell 20 reps    Straight Leg Raise 10 reps      Lumbar Exercises: Sidelying   Hip Abduction 10 reps;Both      Manual Therapy   Manual Therapy Joint mobilization;Soft tissue mobilization;Passive ROM    Joint Mobilization long leg distraction x 2 min bil;     Passive ROM Gr toe mobs, Gr toe mobility, PFascia stretch;  PT Education - 07/08/20 1022    Education Details HEP reviewed    Person(s) Educated Patient    Methods Explanation;Demonstration;Verbal cues;Handout    Comprehension Verbalized understanding;Returned demonstration;Verbal cues required            PT Short Term Goals - 07/08/20 1042      PT SHORT TERM GOAL #1   Title Pt to be independent with initial HEP    Time 2    Period Weeks    Status Achieved    Target Date 06/24/20             PT Long Term Goals - 07/08/20 1042      PT LONG TERM GOAL #1   Title Pt to be independent with final HEP for back and foot pain    Time 6    Period Weeks    Status Achieved      PT LONG TERM GOAL #2   Title Pt to report decreased pain in low back to 0-2/10 with activity    Time 6    Period Weeks    Status Achieved      PT LONG TERM GOAL #3   Title Pt to demo improved strength of hips to be at least 4+/5, and core , to have minimal/no postural changes with stability exercises.    Time 6    Period Weeks    Status Achieved                 Plan - 07/08/20 1226    Clinical Impression  Statement Pt doing well, having less pain. Does have mild soreness, that she is doing well managing with HEP and heat. Foot pain variable. Have discussed footwear, pt has not yet obtained. Pt has met goals at this time, and is ready for d/c to HEP. If foot pain does not ipmrove, pt may benefit from future PT for foot pain. Also gave pt referral as needed to ortho MD is she should have problems with back in future.    Personal Factors and Comorbidities Time since onset of injury/illness/exacerbation    Examination-Activity Limitations Locomotion Level;Bend;Carry;Squat;Stand;Lift    Examination-Participation Restrictions Meal Prep;Cleaning;Community Activity;Shop;Yard Work;Laundry    Stability/Clinical Decision Making Stable/Uncomplicated    Rehab Potential Good    PT Frequency 2x / week    PT Duration 6 weeks    PT Treatment/Interventions ADLs/Self Care Home Management;Cryotherapy;Electrical Stimulation;Gait training;DME Instruction;Ultrasound;Traction;Moist Heat;Iontophoresis 55m/ml Dexamethasone;Stair training;Functional mobility training;Therapeutic activities;Therapeutic exercise;Balance training;Neuromuscular re-education;Manual techniques;Patient/family education;Orthotic Fit/Training;Passive range of motion;Joint Manipulations;Dry needling;Splinting;Taping;Spinal Manipulations;Vasopneumatic Device    Consulted and Agree with Plan of Care Patient           Patient will benefit from skilled therapeutic intervention in order to improve the following deficits and impairments:  Decreased range of motion, Increased muscle spasms, Decreased activity tolerance, Pain, Impaired flexibility, Improper body mechanics, Decreased strength, Decreased mobility, Hypomobility  Visit Diagnosis: Chronic bilateral low back pain without sciatica     Problem List Patient Active Problem List   Diagnosis Date Noted  . 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 Primary localized osteoarthrosis of multiple sites  09/04/2019  . Chronic allergic rhinitis 09/04/2019  . Benign essential hypertension 03/29/2019  . Seasonal allergic rhinitis due to pollen 03/29/2019  . Mild intermittent asthma without complication 073/71/0626 . Dry eye syndrome 03/29/2019  . Family history of premature CAD 03/29/2019  . GAD (generalized anxiety disorder) 04/13/2018  . Mixed hyperlipidemia 04/13/2018  . Primary insomnia 11/16/2017  . Gastroesophageal reflux disease  10/29/2015  . Postmenopausal atrophic vaginitis 03/13/2013  . Dyspareunia 03/13/2013  . Basal cell carcinoma of skin 02/29/2012     Lyndee Hensen, PT, DPT 12:29 PM  07/08/20   [facility] [place], [place], [address] Phone: [phone]   Fax:  [phone]  Name: Tina Kelley MRN: [id number] Date of Birth: 09-01-1951   PHYSICAL THERAPY DISCHARGE SUMMARY  Visits from Start of Care: 4 Plan: Patient agrees to discharge.  Patient goals were met. Patient is being discharged due to meeting the stated rehab goals.  ?????       Lyndee Hensen, PT, DPT 12:30 PM  07/08/20

## 2020-07-11 DIAGNOSIS — M7662 Achilles tendinitis, left leg: Secondary | ICD-10-CM | POA: Diagnosis not present

## 2020-07-11 DIAGNOSIS — M205X1 Other deformities of toe(s) (acquired), right foot: Secondary | ICD-10-CM | POA: Diagnosis not present

## 2020-07-11 DIAGNOSIS — M12572 Traumatic arthropathy, left ankle and foot: Secondary | ICD-10-CM | POA: Diagnosis not present

## 2020-07-11 DIAGNOSIS — M12571 Traumatic arthropathy, right ankle and foot: Secondary | ICD-10-CM | POA: Diagnosis not present

## 2020-09-04 DIAGNOSIS — R1032 Left lower quadrant pain: Secondary | ICD-10-CM | POA: Diagnosis not present

## 2020-09-09 ENCOUNTER — Ambulatory Visit: Payer: Medicare PPO

## 2020-09-12 ENCOUNTER — Ambulatory Visit (INDEPENDENT_AMBULATORY_CARE_PROVIDER_SITE_OTHER): Payer: Medicare PPO

## 2020-09-12 DIAGNOSIS — M7751 Other enthesopathy of right foot: Secondary | ICD-10-CM | POA: Diagnosis not present

## 2020-09-12 DIAGNOSIS — R52 Pain, unspecified: Secondary | ICD-10-CM | POA: Diagnosis not present

## 2020-09-12 DIAGNOSIS — M2012 Hallux valgus (acquired), left foot: Secondary | ICD-10-CM | POA: Diagnosis not present

## 2020-09-12 DIAGNOSIS — M2011 Hallux valgus (acquired), right foot: Secondary | ICD-10-CM | POA: Diagnosis not present

## 2020-09-12 DIAGNOSIS — M12571 Traumatic arthropathy, right ankle and foot: Secondary | ICD-10-CM | POA: Diagnosis not present

## 2020-09-12 DIAGNOSIS — M12572 Traumatic arthropathy, left ankle and foot: Secondary | ICD-10-CM | POA: Diagnosis not present

## 2020-09-12 DIAGNOSIS — Z0000 Encounter for general adult medical examination without abnormal findings: Secondary | ICD-10-CM

## 2020-09-12 NOTE — Patient Instructions (Addendum)
Tina Kelley , Thank you for taking time to come for your Medicare Wellness Visit. I appreciate your ongoing commitment to your health goals. Please review the following plan we discussed and let me know if I can assist you in the future.   Screening recommendations/referrals: Colonoscopy: Done 10/27/19 Mammogram: Done 05/03/20 Bone Density: Done 04/22/19 Recommended yearly ophthalmology/optometry visit for glaucoma screening and checkup Recommended yearly dental visit for hygiene and checkup  Vaccinations: Influenza vaccine: Done 06/11/20 Pneumococcal vaccine: Up to date Tdap vaccine: Up to date Shingles vaccine: Shingrix discussed. Please contact your pharmacy for coverage information.    Covid-19:Completed 1/30, 3/4, & 08/05/20  Advanced directives: Please bring a copy of your health care power of attorney and living will to the office at your convenience.  Conditions/risks identified: Maintain Health  Next appointment: Follow up in one year for your annual wellness visit    Preventive Care 65 Years and Older, Female Preventive care refers to lifestyle choices and visits with your health care provider that can promote health and wellness. What does preventive care include?  A yearly physical exam. This is also called an annual well check.  Dental exams once or twice a year.  Routine eye exams. Ask your health care provider how often you should have your eyes checked.  Personal lifestyle choices, including:  Daily care of your teeth and gums.  Regular physical activity.  Eating a healthy diet.  Avoiding tobacco and drug use.  Limiting alcohol use.  Practicing safe sex.  Taking low-dose aspirin every day.  Taking vitamin and mineral supplements as recommended by your health care provider. What happens during an annual well check? The services and screenings done by your health care provider during your annual well check will depend on your age, overall health, lifestyle  risk factors, and family history of disease. Counseling  Your health care provider may ask you questions about your:  Alcohol use.  Tobacco use.  Drug use.  Emotional well-being.  Home and relationship well-being.  Sexual activity.  Eating habits.  History of falls.  Memory and ability to understand (cognition).  Work and work Statistician.  Reproductive health. Screening  You may have the following tests or measurements:  Height, weight, and BMI.  Blood pressure.  Lipid and cholesterol levels. These may be checked every 5 years, or more frequently if you are over 72 years old.  Skin check.  Lung cancer screening. You may have this screening every year starting at age 70 if you have a 30-pack-year history of smoking and currently smoke or have quit within the past 15 years.  Fecal occult blood test (FOBT) of the stool. You may have this test every year starting at age 70.  Flexible sigmoidoscopy or colonoscopy. You may have a sigmoidoscopy every 5 years or a colonoscopy every 10 years starting at age 70.  Hepatitis C blood test.  Hepatitis B blood test.  Sexually transmitted disease (STD) testing.  Diabetes screening. This is done by checking your blood sugar (glucose) after you have not eaten for a while (fasting). You may have this done every 1-3 years.  Bone density scan. This is done to screen for osteoporosis. You may have this done starting at age 70.  Mammogram. This may be done every 1-2 years. Talk to your health care provider about how often you should have regular mammograms. Talk with your health care provider about your test results, treatment options, and if necessary, the need for more tests. Vaccines  Your  health care provider may recommend certain vaccines, such as:  Influenza vaccine. This is recommended every year.  Tetanus, diphtheria, and acellular pertussis (Tdap, Td) vaccine. You may need a Td booster every 10 years.  Zoster vaccine.  You may need this after age 72.  Pneumococcal 13-valent conjugate (PCV13) vaccine. One dose is recommended after age 70.  Pneumococcal polysaccharide (PPSV23) vaccine. One dose is recommended after age 70. Talk to your health care provider about which screenings and vaccines you need and how often you need them. This information is not intended to replace advice given to you by your health care provider. Make sure you discuss any questions you have with your health care provider. Document Released: 10/11/2015 Document Revised: 06/03/2016 Document Reviewed: 07/16/2015 Elsevier Interactive Patient Education  2017 [place] Prevention in the Home Falls can cause injuries. They can happen to people of all ages. There are many things you can do to make your home safe and to help prevent falls. What can I do on the outside of my home?  Regularly fix the edges of walkways and driveways and fix any cracks.  Remove anything that might make you trip as you walk through a door, such as a raised step or threshold.  Trim any bushes or trees on the path to your home.  Use bright outdoor lighting.  Clear any walking paths of anything that might make someone trip, such as rocks or tools.  Regularly check to see if handrails are loose or broken. Make sure that both sides of any steps have handrails.  Any raised decks and porches should have guardrails on the edges.  Have any leaves, snow, or ice cleared regularly.  Use sand or salt on walking paths during winter.  Clean up any spills in your garage right away. This includes oil or grease spills. What can I do in the bathroom?  Use night lights.  Install grab bars by the toilet and in the tub and shower. Do not use towel bars as grab bars.  Use non-skid mats or decals in the tub or shower.  If you need to sit down in the shower, use a plastic, non-slip stool.  Keep the floor dry. Clean up any water that spills on the floor as soon  as it happens.  Remove soap buildup in the tub or shower regularly.  Attach bath mats securely with double-sided non-slip rug tape.  Do not have throw rugs and other things on the floor that can make you trip. What can I do in the bedroom?  Use night lights.  Make sure that you have a light by your bed that is easy to reach.  Do not use any sheets or blankets that are too big for your bed. They should not hang down onto the floor.  Have a firm chair that has side arms. You can use this for support while you get dressed.  Do not have throw rugs and other things on the floor that can make you trip. What can I do in the kitchen?  Clean up any spills right away.  Avoid walking on wet floors.  Keep items that you use a lot in easy-to-reach places.  If you need to reach something above you, use a strong step stool that has a grab bar.  Keep electrical cords out of the way.  Do not use floor polish or wax that makes floors slippery. If you must use wax, use non-skid floor wax.  Do not  have throw rugs and other things on the floor that can make you trip. What can I do with my stairs?  Do not leave any items on the stairs.  Make sure that there are handrails on both sides of the stairs and use them. Fix handrails that are broken or loose. Make sure that handrails are as long as the stairways.  Check any carpeting to make sure that it is firmly attached to the stairs. Fix any carpet that is loose or worn.  Avoid having throw rugs at the top or bottom of the stairs. If you do have throw rugs, attach them to the floor with carpet tape.  Make sure that you have a light switch at the top of the stairs and the bottom of the stairs. If you do not have them, ask someone to add them for you. What else can I do to help prevent falls?  Wear shoes that:  Do not have high heels.  Have rubber bottoms.  Are comfortable and fit you well.  Are closed at the toe. Do not wear sandals.  If  you use a stepladder:  Make sure that it is fully opened. Do not climb a closed stepladder.  Make sure that both sides of the stepladder are locked into place.  Ask someone to hold it for you, if possible.  Clearly mark and make sure that you can see:  Any grab bars or handrails.  First and last steps.  Where the edge of each step is.  Use tools that help you move around (mobility aids) if they are needed. These include:  Canes.  Walkers.  Scooters.  Crutches.  Turn on the lights when you go into a dark area. Replace any light bulbs as soon as they burn out.  Set up your furniture so you have a clear path. Avoid moving your furniture around.  If any of your floors are uneven, fix them.  If there are any pets around you, be aware of where they are.  Review your medicines with your doctor. Some medicines can make you feel dizzy. This can increase your chance of falling. Ask your doctor what other things that you can do to help prevent falls. This information is not intended to replace advice given to you by your health care provider. Make sure you discuss any questions you have with your health care provider. Document Released: 07/11/2009 Document Revised: 02/20/2016 Document Reviewed: 10/19/2014 Elsevier Interactive Patient Education  2017 [employer].

## 2020-09-12 NOTE — Progress Notes (Signed)
Virtual Visit via Telephone Note  I connected with  Tina Kelley on 09/12/20 at  3:15 PM EST by telephone and verified that I am speaking with the correct person using two identifiers.  Medicare Annual Wellness visit completed telephonically due to Covid-19 pandemic.   Persons participating in this call: This Health Coach and this patient.   Location: Patient: Home Provider: Office   I discussed the limitations, risks, security and privacy concerns of performing an evaluation and management service by telephone and the availability of in person appointments. The patient expressed understanding and agreed to proceed.  Unable to perform video visit due to video visit attempted and failed and/or patient does not have video capability.   Some vital signs may be absent or patient reported.   Willette Brace, LPN    Subjective:   Tina Kelley is a 69 y.o. female who presents for Medicare Annual (Subsequent) preventive examination.  Review of Systems     Cardiac Risk Factors include: advanced age (>15men, >26 women);hypertension;dyslipidemia     Objective:    There were no vitals filed for this visit. There is no height or weight on file to calculate BMI.  Advanced Directives 09/12/2020 06/12/2020 06/10/2020 08/07/2019  Does Patient Have a Medical Advance Directive? Yes No No Yes  Type of Paramedic of [place];Living will - - Living will;Healthcare Power of Attorney  Does patient want to make changes to medical advance directive? - - - No - Patient declined  Copy of [place] in Chart? No - copy requested - - No - copy requested  Would patient like information on creating a medical advance directive? - No - Patient declined No - Patient declined -    Current Medications (verified) Outpatient Encounter Medications as of 09/12/2020  Medication Sig  . albuterol (VENTOLIN HFA) 108 (90 Base) MCG/ACT inhaler   . buPROPion (WELLBUTRIN SR) 150  MG 12 hr tablet   . cholestyramine (QUESTRAN) 4 g packet cholestyramine (with sugar) 4 gram powder for susp in a packet  . conjugated estrogens (PREMARIN) vaginal cream Place vaginally 3 (three) times a week.  . cycloSPORINE (RESTASIS) 0.05 % ophthalmic emulsion Place 1 drop into both eyes 2 (two) times daily.  Marland Kitchen EPINEPHrine (EPIPEN 2-PAK) 0.3 mg/0.3 mL IJ SOAJ injection Inject 0.3 mLs (0.3 mg total) into the muscle as needed for anaphylaxis. Use as instructed.  . Esomeprazole Magnesium (NEXIUM PO) Take by mouth.  . fluticasone (FLONASE) 50 MCG/ACT nasal spray Place into the nose.  . Lactobacillus Rhamnosus, GG, (CULTURELLE) CAPS Take by mouth.  . losartan (COZAAR) 50 MG tablet Take 1 tablet (50 mg total) by mouth daily.  . metoprolol tartrate (LOPRESSOR) 50 MG tablet Take by mouth.  Marland Kitchen PRESCRIPTION MEDICATION Cholestyramine for Oral Suspension  . rosuvastatin (CRESTOR) 10 MG tablet Take by mouth.  . zolpidem (AMBIEN CR) 12.5 MG CR tablet Take by mouth.  . [DISCONTINUED] esomeprazole (NEXIUM) 20 MG capsule Take 1 -2 capsules daily as needed (Patient not taking: Reported on 09/12/2020)  . [DISCONTINUED] ranitidine (ZANTAC) 150 MG tablet Take by mouth. (Patient not taking: Reported on 09/12/2020)   No facility-administered encounter medications on file as of 09/12/2020.    Allergies (verified) Aspirin, Nsaids, Penicillins, and Lisinopril   History: Past Medical History:  Diagnosis Date  . Basal cell carcinoma of skin 02/29/2012   basal cell carcinoma on her left melolabial fold approximately 30 years  ago  . Diverticulitis   . Essential hypertension 03/29/2019  .  GERD (gastroesophageal reflux disease) 03/29/2019   Chronic, normal EGD. Chronic PPI  . Mild intermittent asthma without complication 11/04/2534  . Postmenopausal atrophic vaginitis 03/13/2013  . Primary insomnia 11/16/2017  . Seasonal allergic rhinitis due to pollen 03/29/2019   Past Surgical History:  Procedure Laterality Date  .  COLONOSCOPY     x2   Family History  Problem Relation Age of Onset  . Lung cancer Mother   . Heart disease Father   . Heart attack Father   . Healthy Son   . Healthy Son   . High blood pressure Brother    Social History   Socioeconomic History  . Marital status: Married    Spouse name: Not on file  . Number of children: 2  . Years of education: Not on file  . Highest education level: Not on file  Occupational History  . Occupation: retired Pharmacist, hospital  Tobacco Use  . Smoking status: Never Smoker  . Smokeless tobacco: Never Used  Vaping Use  . Vaping Use: Never used  Substance and Sexual Activity  . Alcohol use: Yes  . Drug use: Never  . Sexual activity: Yes    Birth control/protection: Post-menopausal  Other Topics Concern  . Not on file  Social History Narrative  . Not on file   Social Determinants of Health   Financial Resource Strain: Low Risk   . Difficulty of Paying Living Expenses: Not hard at all  Food Insecurity: No Food Insecurity  . Worried About Charity fundraiser in the Last Year: Never true  . Ran Out of Food in the Last Year: Never true  Transportation Needs: No Transportation Needs  . Lack of Transportation (Medical): No  . Lack of Transportation (Non-Medical): No  Physical Activity: Inactive  . Days of Exercise per Week: 0 days  . Minutes of Exercise per Session: 0 min  Stress: No Stress Concern Present  . Feeling of Stress : Not at all  Social Connections: Moderately Integrated  . Frequency of Communication with Friends and Family: More than three times a week  . Frequency of Social Gatherings with Friends and Family: Twice a week  . Attends Religious Services: More than 4 times per year  . Active Member of Clubs or Organizations: No  . Attends Archivist Meetings: Never  . Marital Status: Married    Tobacco Counseling Counseling given: Not Answered   Clinical Intake:  Pre-visit preparation completed: Yes  Pain : No/denies  pain     BMI - recorded: 23.03 Nutritional Status: BMI of 19-24  Normal Nutritional Risks: None Diabetes: No  How often do you need to have someone help you when you read instructions, pamphlets, or other written materials from your doctor or pharmacy?: 1 - Never  Diabetic?No  Interpreter Needed?: No  Information entered by :: Charlott Rakes, LPN   Activities of Daily Living In your present state of health, do you have any difficulty performing the following activities: 09/12/2020  Hearing? Y  Comment may have mild loss  Vision? N  Difficulty concentrating or making decisions? N  Walking or climbing stairs? N  Dressing or bathing? N  Doing errands, shopping? N  Preparing Food and eating ? N  Using the Toilet? N  In the past six months, have you accidently leaked urine? N  Do you have problems with loss of bowel control? N  Managing your Medications? N  Managing your Finances? N  Housekeeping or managing your Housekeeping? N  Some recent  data might be hidden    Patient Care Team: Leamon Arnt, MD as PCP - General (Family Medicine) Lenon Oms, MD as Consulting Physician (Obstetrics and Gynecology) Randye Lobo, Londell Moh, FNP as Consulting Physician (Dermatology) Trula Slade, DPM as Consulting Physician (Podiatry)  Indicate any recent Medical Services you may have received from other than [facility] providers in the past year (date may be approximate).     Assessment:   This is a routine wellness examination for Tina Kelley.  Hearing/Vision screen  Hearing Screening   125Hz  250Hz  500Hz  1000Hz  2000Hz  3000Hz  4000Hz  6000Hz  8000Hz   Right ear:           Left ear:           Comments: [place]   Vision Screening Comments: Pt follows Dr Laban Emperor for annual eye exams  Dietary issues and exercise activities discussed: Current Exercise Habits: The patient does not participate in regular exercise at present  Goals    . Patient  Stated     Maintain health      Depression Screen PHQ 2/9 Scores 09/12/2020 08/07/2019 03/29/2019  PHQ - 2 Score 0 1 2  PHQ- 9 Score - - 3    Fall Risk Fall Risk  09/12/2020 08/07/2019 03/29/2019  Falls in the past year? 0 0 0  Number falls in past yr: 0 - 0  Injury with Fall? 0 0 0  Risk for fall due to : Impaired vision - -  Follow up Falls prevention discussed Falls evaluation completed;Education provided;Falls prevention discussed Falls evaluation completed    FALL RISK PREVENTION PERTAINING TO THE HOME:  Any stairs in or around the home? No  If so, are there any without handrails? No  Home free of loose throw rugs in walkways, pet beds, electrical cords, etc? Yes  Adequate lighting in your home to reduce risk of falls? Yes   ASSISTIVE DEVICES UTILIZED TO PREVENT FALLS:  Life alert? No  Use of a cane, walker or w/c? No  Grab bars in the bathroom? No  Shower chair or bench in shower? Yes  Elevated toilet seat or a handicapped toilet? No   TIMED UP AND GO:  Was the test performed? No .    Cognitive Function: MMSE - Mini Mental State Exam 08/07/2019  Orientation to time 5  Orientation to Place 5  Registration 3  Attention/ Calculation 5  Recall 3  Language- name 2 objects 2  Language- repeat 1  Language- follow 3 step command 3  Language- read & follow direction 1  Write a sentence 1  Copy design 1  Total score 30     6CIT Screen 09/12/2020  What Year? 0 points  What month? 0 points  Count back from 20 0 points  Months in reverse 0 points  Repeat phrase 0 points    Immunizations Immunization History  Administered Date(s) Administered  . Fluad Quad(high Dose 65+) 06/19/2019, 06/11/2020  . Influenza Split 08/30/2012, 08/18/2013, 07/18/2014  . Influenza, High Dose Seasonal PF 06/19/2019, 06/11/2020  . Influenza, Seasonal, Injecte, Preservative Fre 07/16/2011  . Influenza,inj,quad, With Preservative 07/01/2015, 06/30/2016, 06/28/2018  . Moderna  Sars-Covid-2 Vaccination 10/28/2019, 11/30/2019, 08/05/2020  . Pneumococcal Conjugate-13 10/24/2014  . Pneumococcal Polysaccharide-23 10/29/2015  . Tdap 01/08/2011    TDAP status: Up to date  Flu Vaccine status: Up to date  Pneumococcal vaccine status: Up to date  Covid-19 vaccine status: Completed vaccines  Qualifies for Shingles Vaccine? Yes  Zostavax completed No   Shingrix Completed?: No.    Education has been provided regarding the importance of this vaccine. Patient has been advised to call insurance company to determine out of pocket expense if they have not yet received this vaccine. Advised may also receive vaccine at local pharmacy or Health Dept. Verbalized acceptance and understanding.  Screening Tests Health Maintenance  Topic Date Due  . Hepatitis C Screening  10/29/2020 (Originally [birth year])  . TETANUS/TDAP  01/07/2021  . COVID-19 Vaccine (4 - Booster for Moderna series) 02/02/2021  . DEXA SCAN  04/21/2021  . MAMMOGRAM  05/03/2021  . COLONOSCOPY  10/26/2026  . INFLUENZA VACCINE  Completed  . PNA vac Low Risk Adult  Completed    Health Maintenance  There are no preventive care reminders to display for this patient.  Colorectal cancer screening: Type of screening: Colonoscopy. Completed 10/27/19. Repeat every 7 years  Mammogram status: Completed 05/03/20. Repeat every year  Bone Density status: Completed 04/22/19. Results reflect: Bone density results: OSTEOPENIA. Repeat every 2 years.   Additional Screening:  Hepatitis C Screening: does qualify  Vision Screening: Recommended annual ophthalmology exams for early detection of glaucoma and other disorders of the eye. Is the patient up to date with their annual eye exam?  Yes  Who is the provider or what is the name of the office in which the patient attends annual eye exams? Dr Laban Emperor  Dental Screening: Recommended annual dental exams for proper oral hygiene  Community Resource Referral / Chronic Care  Management: CRR required this visit?  No   CCM required this visit?  No      Plan:     I have personally reviewed and noted the following in the patient's chart:   . Medical and social history . Use of alcohol, tobacco or illicit drugs  . Current medications and supplements . Functional ability and status . Nutritional status . Physical activity . Advanced directives . List of other physicians . Hospitalizations, surgeries, and ER visits in previous 12 months . Vitals . Screenings to include cognitive, depression, and falls . Referrals and appointments  In addition, I have reviewed and discussed with patient certain preventive protocols, quality metrics, and best practice recommendations. A written personalized care plan for preventive services as well as general preventive health recommendations were provided to patient.     Willette Brace, LPN   [id number]   Nurse Notes: None

## 2020-10-03 ENCOUNTER — Encounter: Payer: Self-pay | Admitting: Family Medicine

## 2020-10-03 MED ORDER — BUPROPION HCL ER (SR) 150 MG PO TB12
150.0000 mg | ORAL_TABLET | Freq: Two times a day (BID) | ORAL | 3 refills | Status: DC
Start: 2020-10-03 — End: 2021-09-19

## 2020-10-15 ENCOUNTER — Encounter: Payer: Self-pay | Admitting: Family Medicine

## 2020-10-16 MED ORDER — ZOLPIDEM TARTRATE ER 12.5 MG PO TBCR
12.5000 mg | EXTENDED_RELEASE_TABLET | Freq: Every day | ORAL | 5 refills | Status: DC
Start: 2020-10-16 — End: 2020-11-25

## 2020-11-12 ENCOUNTER — Encounter: Payer: Self-pay | Admitting: Family Medicine

## 2020-11-13 ENCOUNTER — Other Ambulatory Visit: Payer: Self-pay

## 2020-11-13 MED ORDER — METOPROLOL TARTRATE 50 MG PO TABS
50.0000 mg | ORAL_TABLET | Freq: Every day | ORAL | 0 refills | Status: DC
Start: 2020-11-13 — End: 2020-11-25

## 2020-11-25 ENCOUNTER — Encounter: Payer: Self-pay | Admitting: Family Medicine

## 2020-11-25 ENCOUNTER — Other Ambulatory Visit: Payer: Self-pay

## 2020-11-25 ENCOUNTER — Ambulatory Visit (INDEPENDENT_AMBULATORY_CARE_PROVIDER_SITE_OTHER): Payer: Medicare PPO | Admitting: Family Medicine

## 2020-11-25 VITALS — BP 122/82 | HR 63 | Temp 97.2°F | Resp 16 | Ht 65.0 in | Wt 137.4 lb

## 2020-11-25 DIAGNOSIS — F411 Generalized anxiety disorder: Secondary | ICD-10-CM

## 2020-11-25 DIAGNOSIS — J309 Allergic rhinitis, unspecified: Secondary | ICD-10-CM

## 2020-11-25 DIAGNOSIS — K9089 Other intestinal malabsorption: Secondary | ICD-10-CM

## 2020-11-25 DIAGNOSIS — K219 Gastro-esophageal reflux disease without esophagitis: Secondary | ICD-10-CM

## 2020-11-25 DIAGNOSIS — R058 Other specified cough: Secondary | ICD-10-CM

## 2020-11-25 DIAGNOSIS — I10 Essential (primary) hypertension: Secondary | ICD-10-CM

## 2020-11-25 DIAGNOSIS — Z0000 Encounter for general adult medical examination without abnormal findings: Secondary | ICD-10-CM

## 2020-11-25 DIAGNOSIS — E782 Mixed hyperlipidemia: Secondary | ICD-10-CM | POA: Diagnosis not present

## 2020-11-25 DIAGNOSIS — T464X5A Adverse effect of angiotensin-converting-enzyme inhibitors, initial encounter: Secondary | ICD-10-CM

## 2020-11-25 DIAGNOSIS — F5101 Primary insomnia: Secondary | ICD-10-CM

## 2020-11-25 DIAGNOSIS — M19041 Primary osteoarthritis, right hand: Secondary | ICD-10-CM

## 2020-11-25 DIAGNOSIS — N952 Postmenopausal atrophic vaginitis: Secondary | ICD-10-CM | POA: Diagnosis not present

## 2020-11-25 HISTORY — DX: Adverse effect of angiotensin-converting-enzyme inhibitors, initial encounter: T46.4X5A

## 2020-11-25 HISTORY — DX: Other specified cough: R05.8

## 2020-11-25 LAB — COMPREHENSIVE METABOLIC PANEL
ALT: 20 U/L (ref 0–35)
AST: 22 U/L (ref 0–37)
Albumin: 4 g/dL (ref 3.5–5.2)
Alkaline Phosphatase: 68 U/L (ref 39–117)
BUN: 14 mg/dL (ref 6–23)
CO2: 27 mEq/L (ref 19–32)
Calcium: 9.5 mg/dL (ref 8.4–10.5)
Chloride: 103 mEq/L (ref 96–112)
Creatinine, Ser: 0.94 mg/dL (ref 0.40–1.20)
GFR: 61.22 mL/min (ref >60.00)
Glucose, Bld: 117 mg/dL — ABNORMAL HIGH (ref 70–99)
Potassium: 4 mEq/L (ref 3.5–5.1)
Sodium: 138 mEq/L (ref 135–145)
Total Bilirubin: 0.7 mg/dL (ref 0.2–1.2)
Total Protein: 6.8 g/dL (ref 6.0–8.3)

## 2020-11-25 LAB — CBC WITH DIFFERENTIAL/PLATELET
Basophils Absolute: 0 10*3/uL (ref 0.0–0.1)
Basophils Relative: 0.7 % (ref 0.0–3.0)
Eosinophils Absolute: 0.1 10*3/uL (ref 0.0–0.7)
Eosinophils Relative: 1.8 % (ref 0.0–5.0)
HCT: 42.4 % (ref 36.0–46.0)
Hemoglobin: 14.4 g/dL (ref 12.0–15.0)
Lymphocytes Relative: 19.1 % (ref 12.0–46.0)
Lymphs Abs: 1.3 10*3/uL (ref 0.7–4.0)
MCHC: 34 g/dL (ref 30.0–36.0)
MCV: 92.9 fl (ref 78.0–100.0)
Monocytes Absolute: 0.5 10*3/uL (ref 0.1–1.0)
Monocytes Relative: 7.4 % (ref 3.0–12.0)
Neutro Abs: 4.8 10*3/uL (ref 1.4–7.7)
Neutrophils Relative %: 71 % (ref 43.0–77.0)
Platelets: 279 10*3/uL (ref 150.0–400.0)
RBC: 4.57 Mil/uL (ref 3.87–5.11)
RDW: 13.2 % (ref 11.5–15.5)
WBC: 6.7 10*3/uL (ref 4.0–10.5)

## 2020-11-25 LAB — LIPID PANEL
Cholesterol: 181 mg/dL (ref 0–200)
HDL: 64.9 mg/dL (ref >39.00)
NonHDL: 115.76
Total CHOL/HDL Ratio: 3
Triglycerides: 250 mg/dL — ABNORMAL HIGH (ref 0.0–149.0)
VLDL: 50 mg/dL — ABNORMAL HIGH (ref 0.0–40.0)

## 2020-11-25 LAB — LDL CHOLESTEROL, DIRECT: Direct LDL: 82 mg/dL

## 2020-11-25 LAB — TSH: TSH: 2.08 u[IU]/mL (ref 0.35–4.50)

## 2020-11-25 MED ORDER — SHINGRIX 50 MCG/0.5ML IM SUSR: 0.5000 mL | Freq: Once | INTRAMUSCULAR | 0 refills | Status: AC

## 2020-11-25 MED ORDER — METOPROLOL SUCCINATE ER 50 MG PO TB24: 50.0000 mg | ORAL_TABLET | Freq: Every day | ORAL | 3 refills | Status: DC

## 2020-11-25 MED ORDER — ZOLPIDEM TARTRATE 5 MG PO TABS: 5.0000 mg | ORAL_TABLET | Freq: Every evening | ORAL | 5 refills | Status: DC | PRN

## 2020-11-25 MED ORDER — HYDROCORTISONE ACETATE 25 MG RE SUPP: 25.0000 mg | Freq: Every evening | RECTAL | 5 refills | Status: DC | PRN

## 2020-11-25 MED ORDER — CHOLESTYRAMINE 4 G PO PACK: 4.0000 g | PACK | Freq: Two times a day (BID) | ORAL | 11 refills | Status: DC | PRN

## 2020-11-25 NOTE — Patient Instructions (Signed)
Please return in 6 months for hypertension follow up.  I will release your lab results to you on your MyChart account with further instructions. Please reply with any questions.   Please take the prescription for Shingrix to the pharmacy so they may administer the vaccinations. Your insurance will then cover the injections.   Call [facility] to get set up with GYN.   If you have any questions or concerns, please don't hesitate to send me a message via MyChart or call the office at [phone]. Thank you for visiting with Tina Kelley today! It's our pleasure caring for you.

## 2020-11-25 NOTE — Progress Notes (Signed)
Subjective  Chief Complaint  Patient presents with  . Annual Exam    Fasting,   . Nasal Congestion    States this has been going on for months. OTC Allegra and saline nasal spray. Does have GERD - not sure if this is related   . irregular bowel movement    Will go 2-3 days with no BM then experience a few days of diarrhea     HPI: Tina Kelley is a 70 y.o. female who presents to [facility] today for a Female Wellness Visit. She also has the concerns and/or needs as listed above in the chief complaint. These will be addressed in addition to the Health Maintenance Visit.   Wellness Visit: annual visit with health maintenance review and exam without Pap   Health maintenance: Due for mammogram and bone density at the end of this year. Continues with healthy lifestyle. Due for Shingrix. Other immunizations are up-to-date. Has had eye exam. Colon cancer screen is up-to-date. Chronic disease f/u and/or acute problem visit: (deemed necessary to be done in addition to the wellness visit):  Hypertension: Takes metoprolol 25 mg twice daily. Also on losartan 50 mg daily. No concerning blood pressure. No shortness of breath, chest pain, palpitations.  Hyperlipidemia on Crestor nightly. Tolerates well. Fasting for lab work today.  Chronic allergies, chronic postnasal drip and GERD: Has been to ENT and gastroenterology. As of late, more postnasal drainage and hoarseness. Also with mild breakthrough GERD symptoms. Notices worsening of symptoms after eating chocolate. She is on daily Nexium, Flonase and Allegra. No symptoms of infection. No abdominal pain.  Intermittent diarrhea depending on diet. Uses Questran, needs refill.  Anxiety is well controlled on Wellbutrin.  Chronic insomnia: She uses Ambien but last refill was for the CR version. She prefers the immediate release. She uses 5 mg nightly as needed.  Postmenopausal atrophic vaginitis on 2-3 times weekly vaginal  estrogens. This is helped significantly. No longer experiencing soreness, dryness or itching.  Assessment  1. Annual physical exam   2. Benign essential hypertension   3. Mixed hyperlipidemia   4. Primary insomnia   5. GAD (generalized anxiety disorder)   6. Chronic allergic rhinitis   7. Gastroesophageal reflux disease without esophagitis   8. Postmenopausal atrophic vaginitis   9. Bile salt-induced diarrhea   10. Primary osteoarthritis of right hand      Plan  Female Wellness Visit:  Age appropriate Health Maintenance and Prevention measures were discussed with patient. Included topics are cancer screening recommendations, ways to keep healthy (see AVS) including dietary and exercise recommendations, regular eye and dental care, use of seat belts, and avoidance of moderate alcohol use and tobacco use. Patient to get set up with GYN. They'll order mammogram and bone density later this year.  BMI: discussed patient's BMI and encouraged positive lifestyle modifications to help get to or maintain a target BMI.  HM needs and immunizations were addressed and ordered. See below for orders. See HM and immunization section for updates. Prescription for Shingrix given.  Routine labs and screening tests ordered including cmp, cbc and lipids where appropriate.  Discussed recommendations regarding Vit D and calcium supplementation (see AVS)  Chronic disease management visit and/or acute problem visit:  Hypertension: This medical condition is well controlled. There are no signs of complications, medication side effects, or red flags. Patient is instructed to continue the current treatment plan without change in therapies or medications.. Change to Toprol-XL 50 mg daily for  ease of use. Continue losartan 50 mg daily. Recheck renal function electrolytes.  Hyperlipidemia on statin. Check liver function and lipid panel today.  Anxiety disorder is well controlled on Wellbutrin.  Insomnia: Changed  to Ambien 5 mg p.o. nightly as needed.  Chronic allergies and GERD: Both mildly active. She would like to continue with Flonase and Allegra. Increase Nexium to twice daily for the next 4 to 6 weeks. Follow-up if not improved.  Continue topical estrogens.  I also induced diarrhea improved with Questran. Refill today.   Follow up: 6 months for Hypertension follow up Orders Placed This Encounter  Procedures  . CBC with Differential/Platelet  . Comprehensive metabolic panel  . Lipid panel  . TSH   Meds ordered this encounter  Medications  . Zoster Vaccine Adjuvanted [facility]) injection    Sig: Inject 0.5 mLs into the muscle once for 1 dose. Please give 2nd dose 2-6 months after first dose    Dispense:  2 each    Refill:  0  . cholestyramine (QUESTRAN) 4 g packet    Sig: Take 1 packet (4 g total) by mouth 2 (two) times daily as needed.    Dispense:  60 each    Refill:  11  . metoprolol succinate (TOPROL-XL) 50 MG 24 hr tablet    Sig: Take 1 tablet (50 mg total) by mouth daily. Take with or immediately following a meal.    Dispense:  90 tablet    Refill:  3  . hydrocortisone (ANUSOL-HC) 25 MG suppository    Sig: Place 1 suppository (25 mg total) rectally at bedtime as needed for hemorrhoids.    Dispense:  12 suppository    Refill:  5  . zolpidem (AMBIEN) 5 MG tablet    Sig: Take 1 tablet (5 mg total) by mouth at bedtime as needed for sleep.    Dispense:  30 tablet    Refill:  5      Body mass index is 22.86 kg/m. Wt Readings from Last 3 Encounters:  11/25/20 137 lb 6.4 oz (62.3 kg)  05/22/20 138 lb 6.4 oz (62.8 kg)  11/24/19 137 lb 9.6 oz (62.4 kg)     Patient Active Problem List   Diagnosis Date Noted  . Benign essential hypertension 03/29/2019    Priority: High  . Family history of premature CAD 03/29/2019    Priority: High  . GAD (generalized anxiety disorder) 04/13/2018    Priority: High  . Mixed hyperlipidemia 04/13/2018    Priority: High  . Primary  insomnia 11/16/2017    Priority: High  . 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    Priority: Medium    On questran 3x/week   . Primary localized osteoarthrosis of multiple sites 09/04/2019    Priority: Medium  . Mild intermittent asthma without complication 75/64/3329    Priority: Medium  . Gastroesophageal reflux disease 10/29/2015    Priority: Medium    Chronic, normal EGD. Chronic PPI   . Dyspareunia 03/13/2013    Priority: Medium  . Basal cell carcinoma of skin 02/29/2012    Priority: Medium    basal cell carcinoma on her left melolabial fold approximately 30 years  ago   . ACE-inhibitor cough 11/25/2020    Priority: Low  . Chronic allergic rhinitis 09/04/2019    Priority: Low  . Dry eye syndrome 03/29/2019    Priority: Low  . Postmenopausal atrophic vaginitis 03/13/2013    Priority: Low   Health Maintenance  Topic Date Due  . Hepatitis C  Screening  Never done  . TETANUS/TDAP  01/07/2021  . COVID-19 Vaccine (4 - Booster for Moderna series) 02/02/2021  . DEXA SCAN  04/21/2021  . MAMMOGRAM  05/03/2021  . COLONOSCOPY (Pts 45-21yrs Insurance coverage will need to be confirmed)  10/26/2026  . INFLUENZA VACCINE  Completed  . PNA vac Low Risk Adult  Completed   Immunization History  Administered Date(s) Administered  . Fluad Quad(high Dose 65+) 06/19/2019, 06/11/2020  . Influenza Split 08/30/2012, 08/18/2013, 07/18/2014  . Influenza, High Dose Seasonal PF 06/19/2019, 06/11/2020  . Influenza, Seasonal, Injecte, Preservative Fre 07/16/2011  . Influenza,inj,quad, With Preservative 07/01/2015, 06/30/2016, 06/28/2018  . Moderna Sars-Covid-2 Vaccination 10/28/2019, 11/30/2019, 08/05/2020  . Pneumococcal Conjugate-13 10/24/2014  . Pneumococcal Polysaccharide-23 10/29/2015  . Tdap 01/08/2011   We updated and reviewed the patient's past history in detail and it is documented below. Allergies: Patient is allergic to aspirin, nsaids, penicillins, and lisinopril. Past Medical  History Patient  has a past medical history of ACE-inhibitor cough (11/25/2020), Basal cell carcinoma of skin (02/29/2012), Diverticulitis, Essential hypertension (03/29/2019), GERD (gastroesophageal reflux disease) (03/29/2019), Mild intermittent asthma without complication (03/05/5915), Postmenopausal atrophic vaginitis (03/13/2013), Primary insomnia (11/16/2017), and Seasonal allergic rhinitis due to pollen (03/29/2019). Past Surgical History Patient  has a past surgical history that includes Colonoscopy. Family History: Patient family history includes Healthy in her son and son; Heart attack in her father; Heart disease in her father; High blood pressure in her brother; Lung cancer in her mother. Social History:  Patient  reports that she has never smoked. She has never used smokeless tobacco. She reports current alcohol use. She reports that she does not use drugs.  Review of Systems: Constitutional: negative for fever or malaise Ophthalmic: negative for photophobia, double vision or loss of vision Cardiovascular: negative for chest pain, dyspnea on exertion, or new LE swelling Respiratory: negative for SOB or persistent cough Gastrointestinal: negative for abdominal pain, change in bowel habits or melena Genitourinary: negative for dysuria or gross hematuria, no abnormal uterine bleeding or disharge Musculoskeletal: negative for new gait disturbance or muscular weakness Integumentary: negative for new or persistent rashes, no breast lumps Neurological: negative for TIA or stroke symptoms Psychiatric: negative for SI or delusions Allergic/Immunologic: negative for hives  Patient Care Team    Relationship Specialty Notifications Start End  Leamon Arnt, MD PCP - General Family Medicine  03/29/19   Lenon Oms, MD Consulting Physician Obstetrics and Gynecology  03/29/19   Randye Lobo, Londell Moh, [place] Physician Dermatology  08/07/19   Trula Slade, DPM Consulting Physician Podiatry   08/07/19     Objective  Vitals: BP 122/82   Pulse 63   Temp (!) 97.2 F (36.2 C) (Temporal)   Resp 16   Ht 5\' 5"  (1.651 m)   Wt 137 lb 6.4 oz (62.3 kg)   SpO2 98%   BMI 22.86 kg/m  General:  Well developed, well nourished, no acute distress  Psych:  Alert and orientedx3,normal mood and affect HEENT:  Normocephalic, atraumatic, non-icteric sclera,  supple neck without adenopathy, mass or thyromegaly Cardiovascular:  Normal S1, S2, RRR without gallop, rub or murmur Respiratory:  Good breath sounds bilaterally, CTAB with normal respiratory effort Gastrointestinal: normal bowel sounds, soft, non-tender, no noted masses. No HSM MSK: no deformities, contusions. First MCP on right with osteoarthritic changes. Skin:  Warm, no rashes or suspicious lesions noted Neurologic:    Mental status is normal. CN 2-11 are normal. Gross motor and sensory exams are normal. Normal  gait. No tremor Breast Exam: No mass, skin retraction or nipple discharge is appreciated in either breast. No axillary adenopathy. Fibrocystic changes are not noted    Commons side effects, risks, benefits, and alternatives for medications and treatment plan prescribed today were discussed, and the patient expressed understanding of the given instructions. Patient is instructed to call or message via MyChart if he/she has any questions or concerns regarding our treatment plan. No barriers to understanding were identified. We discussed Red Flag symptoms and signs in detail. Patient expressed understanding regarding what to do in case of urgent or emergency type symptoms.   Medication list was reconciled, printed and provided to the patient in AVS. Patient instructions and summary information was reviewed with the patient as documented in the AVS. This note was prepared with assistance of Dragon voice recognition software. Occasional wrong-word or sound-a-like substitutions may have occurred due to the inherent limitations of voice  recognition software  This visit occurred during the SARS-CoV-2 public health emergency.  Safety protocols were in place, including screening questions prior to the visit, additional usage of staff PPE, and extensive cleaning of exam room while observing appropriate contact time as indicated for disinfecting solutions.

## 2020-11-28 ENCOUNTER — Encounter: Payer: Self-pay | Admitting: Family Medicine

## 2020-11-28 NOTE — Progress Notes (Signed)
Please add on hgbA1c, dx: hyperglycemia Thanks, Dr. Jonni Sanger '

## 2020-11-29 ENCOUNTER — Other Ambulatory Visit (INDEPENDENT_AMBULATORY_CARE_PROVIDER_SITE_OTHER): Payer: Medicare PPO

## 2020-11-29 DIAGNOSIS — R739 Hyperglycemia, unspecified: Secondary | ICD-10-CM

## 2020-11-29 LAB — HEMOGLOBIN A1C: Hgb A1c MFr Bld: 5.6 % (ref 4.6–6.5)

## 2020-12-06 ENCOUNTER — Encounter: Payer: Self-pay | Admitting: Family Medicine

## 2020-12-06 ENCOUNTER — Other Ambulatory Visit: Payer: Self-pay

## 2020-12-06 MED ORDER — ALBUTEROL SULFATE HFA 108 (90 BASE) MCG/ACT IN AERS: 2.0000 | INHALATION_SPRAY | Freq: Four times a day (QID) | RESPIRATORY_TRACT | 1 refills | Status: DC | PRN

## 2020-12-13 ENCOUNTER — Ambulatory Visit: Payer: Medicare PPO | Admitting: Physician Assistant

## 2020-12-24 ENCOUNTER — Ambulatory Visit: Payer: Medicare PPO | Admitting: Physician Assistant

## 2020-12-31 ENCOUNTER — Ambulatory Visit: Payer: Medicare PPO | Admitting: Physician Assistant

## 2020-12-31 ENCOUNTER — Encounter: Payer: Self-pay | Admitting: Physician Assistant

## 2020-12-31 VITALS — BP 122/74 | HR 67 | Ht 65.0 in | Wt 136.0 lb

## 2020-12-31 DIAGNOSIS — R198 Other specified symptoms and signs involving the digestive system and abdomen: Secondary | ICD-10-CM

## 2020-12-31 DIAGNOSIS — R0989 Other specified symptoms and signs involving the circulatory and respiratory systems: Secondary | ICD-10-CM

## 2020-12-31 DIAGNOSIS — R490 Dysphonia: Secondary | ICD-10-CM | POA: Diagnosis not present

## 2020-12-31 DIAGNOSIS — K219 Gastro-esophageal reflux disease without esophagitis: Secondary | ICD-10-CM

## 2020-12-31 MED ORDER — ESOMEPRAZOLE MAGNESIUM 40 MG PO CPDR: 40.0000 mg | DELAYED_RELEASE_CAPSULE | Freq: Two times a day (BID) | ORAL | 5 refills | Status: DC

## 2020-12-31 NOTE — Progress Notes (Signed)
Chief Complaint: Hoarseness, globus sensation, GERD  HPI:    Tina Kelley is a 70 year old female with a past medical history as listed below including reflux, known to Dr. Hilarie Fredrickson, who presents to clinic today with a complaint of hoarseness, globus sensation and reflux.    10/27/2019 colonoscopy with 1 3 mm polyp in the cecum, mild diverticulosis in the rectosigmoid colon and distal sigmoid colon and narrowing of the colon in association with diverticular opening as well as small internal hemorrhoids.  Pathology showed tubular adenoma and repeat recommended in 7 years.    Today, the patient tells me that she has been on Nexium 20 mg which she takes twice daily for years and years for reflux symptoms.  Over the past 6 months she has noticed an increase in daily hoarseness as well as a sensation of "something being in my throat", which occurs 2-3 times a week regardless of if she has eaten anything.  Denies any true dysphagia symptoms.      Does recall an EGD done 5 years ago, we do not have records, which showed that her stomach was "irritated".    Denies fever, chills, weight loss, abdominal pain or change in bowel habits.  Past Medical History:  Diagnosis Date  . ACE-inhibitor cough 11/25/2020  . Basal cell carcinoma of skin 02/29/2012   basal cell carcinoma on her left melolabial fold approximately 30 years  ago  . Diverticulitis   . Essential hypertension 03/29/2019  . GERD (gastroesophageal reflux disease) 03/29/2019   Chronic, normal EGD. Chronic PPI  . Mild intermittent asthma without complication 04/05/8920  . Postmenopausal atrophic vaginitis 03/13/2013  . Primary insomnia 11/16/2017  . Seasonal allergic rhinitis due to pollen 03/29/2019    Past Surgical History:  Procedure Laterality Date  . CHOLECYSTECTOMY    . COLONOSCOPY     x2  . NASAL SINUS SURGERY      Current Outpatient Medications  Medication Sig Dispense Refill  . albuterol (VENTOLIN HFA) 108 (90 Base) MCG/ACT inhaler Inhale 2  puffs into the lungs every 6 (six) hours as needed for wheezing or shortness of breath. 18 g 1  . buPROPion (WELLBUTRIN SR) 150 MG 12 hr tablet Take 1 tablet (150 mg total) by mouth 2 (two) times daily. 180 tablet 3  . cholestyramine (QUESTRAN) 4 g packet Take 1 packet (4 g total) by mouth 2 (two) times daily as needed. 60 each 11  . cycloSPORINE (RESTASIS) 0.05 % ophthalmic emulsion Place 1 drop into both eyes 2 (two) times daily.    Marland Kitchen EPINEPHrine (EPIPEN 2-PAK) 0.3 mg/0.3 mL IJ SOAJ injection Inject 0.3 mLs (0.3 mg total) into the muscle as needed for anaphylaxis. Use as instructed. 1 each 1  . Esomeprazole Magnesium (NEXIUM PO) Take by mouth.    . fluticasone (FLONASE) 50 MCG/ACT nasal spray Place into the nose.    . hydrocortisone (ANUSOL-HC) 25 MG suppository Place 1 suppository (25 mg total) rectally at bedtime as needed for hemorrhoids. 12 suppository 5  . Lactobacillus Rhamnosus, GG, (CULTURELLE) CAPS Take by mouth.    . losartan (COZAAR) 50 MG tablet Take 1 tablet (50 mg total) by mouth daily. 90 tablet 3  . metoprolol succinate (TOPROL-XL) 50 MG 24 hr tablet Take 1 tablet (50 mg total) by mouth daily. Take with or immediately following a meal. 90 tablet 3  . rosuvastatin (CRESTOR) 10 MG tablet Take by mouth.    . zolpidem (AMBIEN) 5 MG tablet Take 1 tablet (5 mg total) by  mouth at bedtime as needed for sleep. 30 tablet 5   No current facility-administered medications for this visit.    Allergies as of 12/31/2020 - Review Complete 12/31/2020  Allergen Reaction Noted  . Aspirin Other (See Comments) 03/06/2013  . Nsaids Shortness Of Breath 03/29/2019  . Penicillins Other (See Comments) 02/07/2012  . Lisinopril Cough 03/29/2019    Family History  Problem Relation Age of Onset  . Lung cancer Mother   . Heart disease Father   . Heart attack Father   . Healthy Son   . Healthy Son   . High blood pressure Brother     Social History   Socioeconomic History  . Marital status:  Married    Spouse name: Not on file  . Number of children: 2  . Years of education: Not on file  . Highest education level: Not on file  Occupational History  . Occupation: retired Pharmacist, hospital  Tobacco Use  . Smoking status: Never Smoker  . Smokeless tobacco: Never Used  Vaping Use  . Vaping Use: Never used  Substance and Sexual Activity  . Alcohol use: Yes  . Drug use: Never  . Sexual activity: Yes    Birth control/protection: Post-menopausal  Other Topics Concern  . Not on file  Social History Narrative  . Not on file   Social Determinants of Health   Financial Resource Strain: Low Risk   . Difficulty of Paying Living Expenses: Not hard at all  Food Insecurity: No Food Insecurity  . Worried About Charity fundraiser in the Last Year: Never true  . Ran Out of Food in the Last Year: Never true  Transportation Needs: No Transportation Needs  . Lack of Transportation (Medical): No  . Lack of Transportation (Non-Medical): No  Physical Activity: Inactive  . Days of Exercise per Week: 0 days  . Minutes of Exercise per Session: 0 min  Stress: No Stress Concern Present  . Feeling of Stress : Not at all  Social Connections: Moderately Integrated  . Frequency of Communication with Friends and Family: More than three times a week  . Frequency of Social Gatherings with Friends and Family: Twice a week  . Attends Religious Services: More than 4 times per year  . Active Member of Clubs or Organizations: No  . Attends Archivist Meetings: Never  . Marital Status: Married  Human resources officer Violence: Not At Risk  . Fear of Current or Ex-Partner: No  . Emotionally Abused: No  . Physically Abused: No  . Sexually Abused: No    Review of Systems:    Constitutional: No weight loss, fever or chills Cardiovascular: No chest pain  Respiratory: No SOB  Gastrointestinal: See HPI and otherwise negative   Physical Exam:  Vital signs: BP 122/74   Pulse 67   Ht 5\' 5"  (1.651 m)    Wt 136 lb (61.7 kg)   BMI 22.63 kg/m   Constitutional:   Pleasant Elderly Caucasian female appears to be in NAD, Well developed, Well nourished, alert and cooperative Respiratory: Respirations even and unlabored. Lungs clear to auscultation bilaterally.   No wheezes, crackles, or rhonchi.  Cardiovascular: Normal S1, S2. No MRG. Regular rate and rhythm. No peripheral edema, cyanosis or pallor.  Gastrointestinal:  Soft, nondistended, nontender. No rebound or guarding. Normal bowel sounds. No appreciable masses or hepatomegaly. Rectal:  Not performed.  Psychiatric: Demonstrates good judgement and reason without abnormal affect or behaviors.  RELEVANT LABS AND IMAGING: CBC    Component  Value Date/Time   WBC 6.7 11/25/2020 0930   RBC 4.57 11/25/2020 0930   HGB 14.4 11/25/2020 0930   HCT 42.4 11/25/2020 0930   PLT 279.0 11/25/2020 0930   MCV 92.9 11/25/2020 0930   MCHC 34.0 11/25/2020 0930   RDW 13.2 11/25/2020 0930   LYMPHSABS 1.3 11/25/2020 0930   MONOABS 0.5 11/25/2020 0930   EOSABS 0.1 11/25/2020 0930   BASOSABS 0.0 11/25/2020 0930    CMP     Component Value Date/Time   NA 138 11/25/2020 0930   NA 140 11/22/2018 0000   K 4.0 11/25/2020 0930   CL 103 11/25/2020 0930   CO2 27 11/25/2020 0930   GLUCOSE 117 (H) 11/25/2020 0930   BUN 14 11/25/2020 0930   BUN 15 11/22/2018 0000   CREATININE 0.94 11/25/2020 0930   CALCIUM 9.5 11/25/2020 0930   PROT 6.8 11/25/2020 0930   ALBUMIN 4.0 11/25/2020 0930   AST 22 11/25/2020 0930   ALT 20 11/25/2020 0930   ALKPHOS 68 11/25/2020 0930   BILITOT 0.7 11/25/2020 0930    Assessment: 1.  GERD: Increase in hoarseness and globus sensation over the past 6 months, no dysphagia; most likely esophagitis/GERD 2.  Hoarseness 3.  Globus sensation  Plan: 1.  Increased patient's Nexium to 40 mg twice daily, 30-60 minutes before breakfast and dinner.  Prescribed #60 with 3 refills. 2.  Reviewed antidysphagia measures and antireflux measures. 3.   Patient to follow in clinic in 2 months with me or sooner if necessary.  If no change in symptoms recommend an EGD.  Ellouise Newer, PA-C [facility] 12/31/2020, 9:20 AM  Cc: Leamon Arnt, MD

## 2020-12-31 NOTE — Patient Instructions (Signed)
If you are age 71 or older, your body mass index should be between 23-30. Your Body mass index is 22.63 kg/m. If this is out of the aforementioned range listed, please consider follow up with your Primary Care Provider.  If you are age 75 or younger, your body mass index should be between 19-25. Your Body mass index is 22.63 kg/m. If this is out of the aformentioned range listed, please consider follow up with your Primary Care Provider.   We have sent the following medications to your pharmacy for you to pick up at your convenience: Nexium 40 mg.  Thank you for choosing me and [facility].  Ellouise Newer , PA-C

## 2021-01-08 ENCOUNTER — Ambulatory Visit (INDEPENDENT_AMBULATORY_CARE_PROVIDER_SITE_OTHER): Payer: Medicare PPO

## 2021-01-08 ENCOUNTER — Ambulatory Visit: Payer: Medicare PPO | Admitting: Podiatry

## 2021-01-08 ENCOUNTER — Other Ambulatory Visit: Payer: Self-pay

## 2021-01-08 DIAGNOSIS — M7752 Other enthesopathy of left foot: Secondary | ICD-10-CM | POA: Diagnosis not present

## 2021-01-08 DIAGNOSIS — M2011 Hallux valgus (acquired), right foot: Secondary | ICD-10-CM | POA: Diagnosis not present

## 2021-01-08 MED ORDER — BETAMETHASONE SOD PHOS & ACET 6 (3-3) MG/ML IJ SUSP
3.0000 mg | Freq: Once | INTRAMUSCULAR | Status: AC
Start: 2021-01-08 — End: 2021-01-08
  Administered 2021-01-08: 3 mg via INTRA_ARTICULAR

## 2021-01-08 MED ORDER — METHYLPREDNISOLONE 4 MG PO TBPK: ORAL_TABLET | ORAL | 0 refills | Status: DC

## 2021-01-08 NOTE — Progress Notes (Signed)
   HPI: 70 y.o. female presenting today for new complaint regarding pain and tenderness to the left foot is been going on for approximately 3-4 weeks now.  Patient denies a history of injury.  She states that her left foot is been very tender and painful with walking.  She knows also that she has a bunion to the right foot and at one point surgery was recommended but she has not been in a position to do anything about it.  It is intermittently painful  Past Medical History:  Diagnosis Date  . ACE-inhibitor cough 11/25/2020  . Basal cell carcinoma of skin 02/29/2012   basal cell carcinoma on her left melolabial fold approximately 30 years  ago  . Diverticulitis   . Essential hypertension 03/29/2019  . GERD (gastroesophageal reflux disease) 03/29/2019   Chronic, normal EGD. Chronic PPI  . Mild intermittent asthma without complication 11/28/2023  . Postmenopausal atrophic vaginitis 03/13/2013  . Primary insomnia 11/16/2017  . Seasonal allergic rhinitis due to pollen 03/29/2019     Physical Exam: General: The patient is alert and oriented x3 in no acute distress.  Dermatology: Skin is warm, dry and supple bilateral lower extremities. Negative for open lesions or macerations.  Vascular: Palpable pedal pulses bilaterally. No edema or erythema noted. Capillary refill within normal limits.  Neurological: Epicritic and protective threshold grossly intact bilaterally.   Musculoskeletal Exam: Range of motion within normal limits to all pedal and ankle joints bilateral. Muscle strength 5/5 in all groups bilateral.  Pain on palpation range of motion second MTPJ left  Radiographic Exam:  Normal osseous mineralization. Joint spaces preserved. No fracture/dislocation/boney destruction.  Moderate hallux valgus deformity noted with increased intermetatarsal angle and hallux abductus angle right foot  Assessment: 1.  Symptomatic hallux valgus right 2.  Second MTPJ capsulitis left   Plan of Care:  1. Patient  evaluated. X-Rays reviewed.  2.  Injection of 0.5 cc Celestone Soluspan injected into the second MTPJ left 3.  Prescription for Medrol Dosepak 4.  Patient allergic to NSAIDs 5.  Continue wearing good supportive [place] shoes 6.  Return to clinic as needed      Edrick Kins, DPM [facility]  Dr. Edrick Kins, DPM    [address], [place] [address]                Office [phone]  Fax [phone]

## 2021-01-12 ENCOUNTER — Encounter: Payer: Self-pay | Admitting: Family Medicine

## 2021-01-14 ENCOUNTER — Other Ambulatory Visit: Payer: Self-pay

## 2021-01-14 MED ORDER — ROSUVASTATIN CALCIUM 10 MG PO TABS: 10.0000 mg | ORAL_TABLET | Freq: Every day | ORAL | 3 refills | Status: DC

## 2021-01-14 NOTE — Progress Notes (Signed)
Addendum: Reviewed and agree with assessment and management plan. Emmerich Cryer M, MD  

## 2021-01-23 ENCOUNTER — Ambulatory Visit: Payer: Medicare PPO | Admitting: Family Medicine

## 2021-01-23 ENCOUNTER — Other Ambulatory Visit: Payer: Self-pay

## 2021-01-23 ENCOUNTER — Encounter: Payer: Self-pay | Admitting: Family Medicine

## 2021-01-23 VITALS — BP 120/70 | HR 67 | Temp 97.2°F | Resp 15 | Ht 65.0 in | Wt 135.4 lb

## 2021-01-23 DIAGNOSIS — J309 Allergic rhinitis, unspecified: Secondary | ICD-10-CM

## 2021-01-23 DIAGNOSIS — F411 Generalized anxiety disorder: Secondary | ICD-10-CM | POA: Diagnosis not present

## 2021-01-23 DIAGNOSIS — F430 Acute stress reaction: Secondary | ICD-10-CM | POA: Diagnosis not present

## 2021-01-23 DIAGNOSIS — K219 Gastro-esophageal reflux disease without esophagitis: Secondary | ICD-10-CM | POA: Diagnosis not present

## 2021-01-23 MED ORDER — SUCRALFATE 1 GM/10ML PO SUSP: 1.0000 g | Freq: Three times a day (TID) | ORAL | 0 refills | Status: DC

## 2021-01-23 MED ORDER — CETIRIZINE HCL 10 MG PO TABS: 10.0000 mg | ORAL_TABLET | Freq: Every day | ORAL | 11 refills | Status: DC

## 2021-01-23 MED ORDER — ALPRAZOLAM 0.5 MG PO TABS: 0.5000 mg | ORAL_TABLET | Freq: Every day | ORAL | 0 refills | Status: DC | PRN

## 2021-01-23 MED ORDER — ESOMEPRAZOLE MAGNESIUM 40 MG PO CPDR
DELAYED_RELEASE_CAPSULE | ORAL | 5 refills | Status: DC
Start: 2021-01-23 — End: 2021-06-16

## 2021-01-23 NOTE — Progress Notes (Signed)
Subjective  CC:  Chief Complaint  Patient presents with  . Stress    States husbands cancer has come back, which effects this.   . Gastroesophageal Reflux    Has gotten worse with stress, Gastro doctor states she may need an endoscopy.     HPI: Particia Kelley is a 70 y.o. female who presents to the office today to address the problems listed above in the chief complaint.  Stress: Unfortunately, patient's husband has recurrent stage IV adenocarcinoma of the lung.  He is currently undergoing chemo.  He has been cancer free for 7 years.  She is understandably upset.  He is declining.  She is having trouble sleeping, worries and is tired.  She has been using 1 or 2 glasses of wine at night to help her last to calm down.  She denies panic attacks.  She is on Wellbutrin for chronic anxiety and feels it is overall continues to work well.  She denies symptoms of depression but is understandably saddened by the news.  Worsening GERD: Reviewed recent notes from gastroenterology.  She has been on Nexium chronically for GERD.  Over the last several months she is having more reflux symptoms including globus sensation and choking sensation in the back of her throat.  She does have allergies as well.  She takes Environmental manager.  Has some hoarseness at times.  Denies postprandial pain or melena.  Her Nexium was increased from 40 daily to 40 twice daily.  She has been on this for the last 3 weeks with mild improvement in symptoms.   Assessment  1. Stress reaction   2. GAD (generalized anxiety disorder)   3. Gastroesophageal reflux disease, unspecified whether esophagitis present   4. Chronic allergic rhinitis      Plan   Stress reaction on chronic anxiety: Appropriate.  Continue Wellbutrin and add as needed Xanax.  Limit alcohol intake.  Monitor mood.  Further support if needed.  Worsening GERD: Continue Nexium for the next 2 to 4 weeks.  Not improving XL daily.  Has follow-up with GI in June.  May  warrant EGD.  No red flag symptoms noted today.  Chronic allergies: Change from Allegra to Zyrtec to see if that helps.  Follow up: As scheduled 05/26/2021  No orders of the defined types were placed in this encounter.  Meds ordered this encounter  Medications  . esomeprazole (NEXIUM) 40 MG capsule    Sig: Take one capsule 30-60 minutes before breakfast and dinner.    Dispense:  60 capsule    Refill:  5  . ALPRAZolam (XANAX) 0.5 MG tablet    Sig: Take 1 tablet (0.5 mg total) by mouth daily as needed for anxiety.    Dispense:  30 tablet    Refill:  0  . cetirizine (ZYRTEC) 10 MG tablet    Sig: Take 1 tablet (10 mg total) by mouth daily.    Dispense:  30 tablet    Refill:  11  . sucralfate (CARAFATE) 1 GM/10ML suspension    Sig: Take 10 mLs (1 g total) by mouth 4 (four) times daily -  with meals and at bedtime.    Dispense:  420 mL    Refill:  0      I reviewed the patients updated PMH, FH, and SocHx.    Patient Active Problem List   Diagnosis Date Noted  . Benign essential hypertension 03/29/2019    Priority: High  . Family history of premature CAD 03/29/2019  Priority: High  . GAD (generalized anxiety disorder) 04/13/2018    Priority: High  . Mixed hyperlipidemia 04/13/2018    Priority: High  . Primary insomnia 11/16/2017    Priority: High  . 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 Primary localized osteoarthrosis of multiple sites 09/04/2019    Priority: Medium  . Mild intermittent asthma without complication 93/81/0175    Priority: Medium  . Gastroesophageal reflux disease 10/29/2015    Priority: Medium  . Dyspareunia 03/13/2013    Priority: Medium  . Basal cell carcinoma of skin 02/29/2012    Priority: Medium  . ACE-inhibitor cough 11/25/2020    Priority: Low  . Chronic allergic rhinitis 09/04/2019    Priority: Low  . Dry eye syndrome 03/29/2019    Priority: Low  . Postmenopausal atrophic vaginitis 03/13/2013    Priority: Low    Current Meds  Medication Sig  . albuterol (VENTOLIN HFA) 108 (90 Base) MCG/ACT inhaler Inhale 2 puffs into the lungs every 6 (six) hours as needed for wheezing or shortness of breath.  . ALPRAZolam (XANAX) 0.5 MG tablet Take 1 tablet (0.5 mg total) by mouth daily as needed for anxiety.  Marland Kitchen buPROPion (WELLBUTRIN SR) 150 MG 12 hr tablet Take 1 tablet (150 mg total) by mouth 2 (two) times daily.  . cetirizine (ZYRTEC) 10 MG tablet Take 1 tablet (10 mg total) by mouth daily.  . cholestyramine (QUESTRAN) 4 g packet Take 1 packet (4 g total) by mouth 2 (two) times daily as needed.  . cycloSPORINE (RESTASIS) 0.05 % ophthalmic emulsion Place 1 drop into both eyes 2 (two) times daily.  Marland Kitchen EPINEPHrine (EPIPEN 2-PAK) 0.3 mg/0.3 mL IJ SOAJ injection Inject 0.3 mLs (0.3 mg total) into the muscle as needed for anaphylaxis. Use as instructed.  . fluticasone (FLONASE) 50 MCG/ACT nasal spray Place into the nose.  . hydrocortisone (ANUSOL-HC) 25 MG suppository Place 1 suppository (25 mg total) rectally at bedtime as needed for hemorrhoids.  . Lactobacillus Rhamnosus, GG, (CULTURELLE) CAPS Take by mouth.  . losartan (COZAAR) 50 MG tablet Take 1 tablet (50 mg total) by mouth daily.  . metoprolol succinate (TOPROL-XL) 50 MG 24 hr tablet Take 1 tablet (50 mg total) by mouth daily. Take with or immediately following a meal.  . rosuvastatin (CRESTOR) 10 MG tablet Take 1 tablet (10 mg total) by mouth daily.  . sucralfate (CARAFATE) 1 GM/10ML suspension Take 10 mLs (1 g total) by mouth 4 (four) times daily -  with meals and at bedtime.  Marland Kitchen zolpidem (AMBIEN) 5 MG tablet Take 1 tablet (5 mg total) by mouth at bedtime as needed for sleep.  . [DISCONTINUED] esomeprazole (NEXIUM) 40 MG capsule Take 1 capsule (40 mg total) by mouth in the morning and at bedtime. Take one capsule 30-60 minutes before breakfast and dinner.    Allergies: Patient is allergic to aspirin, nsaids, penicillins, and lisinopril. Family  History: Patient family history includes Healthy in her son and son; Heart attack in her father; Heart disease in her father; High blood pressure in her brother; Lung cancer in her mother. Social History:  Patient  reports that she has never smoked. She has never used smokeless tobacco. She reports current alcohol use. She reports that she does not use drugs.  Review of Systems: Constitutional: Negative for fever malaise or anorexia Cardiovascular: negative for chest pain Respiratory: negative for SOB or persistent cough Gastrointestinal: negative for abdominal pain  Objective  Vitals: BP 120/70   Pulse  67   Temp (!) 97.2 F (36.2 C) (Temporal)   Resp 15   Ht 5\' 5"  (1.651 m)   Wt 135 lb 6.4 oz (61.4 kg)   SpO2 98%   BMI 22.53 kg/m  General: no acute distress , A&Ox3 Psych: Normal affect, mildly tearful at times.  Good insight. HEENT: PEERL, conjunctiva normal, neck is supple Cardiovascular:  RRR without murmur or gallop.  Respiratory:  Good breath sounds bilaterally, CTAB with normal respiratory effort Gastrointestinal: soft, flat abdomen, normal active bowel sounds, no palpable masses, no hepatosplenomegaly, no appreciated hernias Skin:  Warm, no rashes     Commons side effects, risks, benefits, and alternatives for medications and treatment plan prescribed today were discussed, and the patient expressed understanding of the given instructions. Patient is instructed to call or message via MyChart if he/she has any questions or concerns regarding our treatment plan. No barriers to understanding were identified. We discussed Red Flag symptoms and signs in detail. Patient expressed understanding regarding what to do in case of urgent or emergency type symptoms.   Medication list was reconciled, printed and provided to the patient in AVS. Patient instructions and summary information was reviewed with the patient as documented in the AVS. This note was prepared with assistance of  Dragon voice recognition software. Occasional wrong-word or sound-a-like substitutions may have occurred due to the inherent limitations of voice recognition software  This visit occurred during the SARS-CoV-2 public health emergency.  Safety protocols were in place, including screening questions prior to the visit, additional usage of staff PPE, and extensive cleaning of exam room while observing appropriate contact time as indicated for disinfecting solutions.

## 2021-01-23 NOTE — Patient Instructions (Signed)
Please follow up as scheduled for your next visit with me: 05/26/2021   You may use xanax as needed for anxiety/stress/worry.  Limit alcohol intake to help your reflux symptoms.  Change to zyrtec for your allergies. Stop the allegra.  If needed, you may start carafate in addition to the nexium. I have printed the prescription for you.   If you have any questions or concerns, please don't hesitate to send me a message via MyChart or call the office at [phone]. Thank you for visiting with [place] today! It's our pleasure caring for you.

## 2021-01-28 ENCOUNTER — Other Ambulatory Visit: Payer: Self-pay | Admitting: Family Medicine

## 2021-01-28 NOTE — Telephone Encounter (Signed)
Please advise, medication was discontinued 

## 2021-01-29 ENCOUNTER — Ambulatory Visit: Payer: Medicare PPO | Admitting: Family Medicine

## 2021-02-26 ENCOUNTER — Other Ambulatory Visit: Payer: Self-pay | Admitting: Family Medicine

## 2021-02-28 ENCOUNTER — Ambulatory Visit: Payer: Medicare PPO | Admitting: Physician Assistant

## 2021-02-28 ENCOUNTER — Encounter: Payer: Self-pay | Admitting: Physician Assistant

## 2021-02-28 VITALS — BP 122/70 | HR 72 | Ht 65.0 in | Wt 134.4 lb

## 2021-02-28 DIAGNOSIS — R490 Dysphonia: Secondary | ICD-10-CM

## 2021-02-28 DIAGNOSIS — K219 Gastro-esophageal reflux disease without esophagitis: Secondary | ICD-10-CM

## 2021-02-28 DIAGNOSIS — R0989 Other specified symptoms and signs involving the circulatory and respiratory systems: Secondary | ICD-10-CM

## 2021-02-28 DIAGNOSIS — R198 Other specified symptoms and signs involving the digestive system and abdomen: Secondary | ICD-10-CM

## 2021-02-28 NOTE — Progress Notes (Signed)
Chief Complaint: Follow-up hoarseness, GERD and globus sensation  HPI:    Tina Kelley is a 70 year old female with a past medical history as listed below including reflux, known to Dr. Hilarie Fredrickson, who was referred to me by Leamon Arnt, MD for a follow-up of hoarseness, globus sensation and GERD.      10/27/2019 colonoscopy for screening with one 3 mm polyp in the cecum, mild diverticulosis in the recto-sigmoid colon and in the distal sigmoid colon with associated narrowing as well as small internal hemorrhoids.  Pathology showed tubular adenoma.  Repeat colonoscopy recommended in 7 years.    12/31/2020 patient seen in clinic and described that she had been on Nexium 20 mg twice daily for years.  Over the past 6 months she had noticed an increase in daily hoarseness as well as a sensation of "something being in my throat".  This occurred 2-3 times a week.  At that time increase Nexium to 40 mg twice daily.  Explained that she had had an EGD 5 years prior (we do not have records of this) which showed "irritation in the stomach".  Discussed that if there is no change in symptoms then would recommend an EGD.    Today, patient explains that nothing has really changed, if anything it is "maybe minutely" better, but still continues with hoarseness and a sensation of something constantly being in her throat.  Tells me that when she is swallowing bread this seems to take longer to go down.  Nothing has actually gotten stuck.  Also tells me that since increasing the Nexium she has had some diarrhea about 3-4 times a week.  Denies abdominal pain.    Denies fever, chills or weight loss.  Past Medical History:  Diagnosis Date  . ACE-inhibitor cough 11/25/2020  . Basal cell carcinoma of skin 02/29/2012   basal cell carcinoma on her left melolabial fold approximately 30 years  ago  . Diverticulitis   . Essential hypertension 03/29/2019  . GERD (gastroesophageal reflux disease) 03/29/2019   Chronic, normal EGD. Chronic  PPI  . Mild intermittent asthma without complication 06/01/4966  . Postmenopausal atrophic vaginitis 03/13/2013  . Primary insomnia 11/16/2017  . Seasonal allergic rhinitis due to pollen 03/29/2019    Past Surgical History:  Procedure Laterality Date  . CHOLECYSTECTOMY    . COLONOSCOPY     x2  . NASAL SINUS SURGERY      Current Outpatient Medications  Medication Sig Dispense Refill  . albuterol (VENTOLIN HFA) 108 (90 Base) MCG/ACT inhaler Inhale 2 puffs into the lungs every 6 (six) hours as needed for wheezing or shortness of breath. 18 g 1  . ALPRAZolam (XANAX) 0.5 MG tablet Take 1 tablet (0.5 mg total) by mouth daily as needed for anxiety. 30 tablet 0  . buPROPion (WELLBUTRIN SR) 150 MG 12 hr tablet Take 1 tablet (150 mg total) by mouth 2 (two) times daily. 180 tablet 3  . cetirizine (ZYRTEC) 10 MG tablet Take 1 tablet (10 mg total) by mouth daily. 30 tablet 11  . cholestyramine (QUESTRAN) 4 g packet Take 1 packet (4 g total) by mouth 2 (two) times daily as needed. 60 each 11  . cycloSPORINE (RESTASIS) 0.05 % ophthalmic emulsion Place 1 drop into both eyes 2 (two) times daily.    Marland Kitchen EPINEPHrine (EPIPEN 2-PAK) 0.3 mg/0.3 mL IJ SOAJ injection Inject 0.3 mLs (0.3 mg total) into the muscle as needed for anaphylaxis. Use as instructed. 1 each 1  . esomeprazole (NEXIUM) 40  MG capsule Take one capsule 30-60 minutes before breakfast and dinner. 60 capsule 5  . fluticasone (FLONASE) 50 MCG/ACT nasal spray Place into the nose.    . hydrocortisone (ANUSOL-HC) 25 MG suppository Place 1 suppository (25 mg total) rectally at bedtime as needed for hemorrhoids. 12 suppository 5  . Lactobacillus Rhamnosus, GG, (CULTURELLE) CAPS Take by mouth.    . losartan (COZAAR) 50 MG tablet Take 1 tablet (50 mg total) by mouth daily. 90 tablet 3  . metoprolol succinate (TOPROL-XL) 50 MG 24 hr tablet Take 1 tablet (50 mg total) by mouth daily. Take with or immediately following a meal. 90 tablet 3  . rosuvastatin  (CRESTOR) 10 MG tablet Take 1 tablet (10 mg total) by mouth daily. 90 tablet 3  . sucralfate (CARAFATE) 1 GM/10ML suspension TAKE TEN MILLILITERS BY MOUTH FOUR TIMES A DAY - WITH MEALS AND AT BEDTIME 420 mL 0  . zolpidem (AMBIEN) 5 MG tablet Take 1 tablet (5 mg total) by mouth at bedtime as needed for sleep. 30 tablet 5   No current facility-administered medications for this visit.    Allergies as of 02/28/2021 - Review Complete 01/23/2021  Allergen Reaction Noted  . Aspirin Other (See Comments) 03/06/2013  . Nsaids Shortness Of Breath 03/29/2019  . Penicillins Other (See Comments) 02/07/2012  . Lisinopril Cough 03/29/2019    Family History  Problem Relation Age of Onset  . Lung cancer Mother   . Heart disease Father   . Heart attack Father   . Healthy Son   . Healthy Son   . High blood pressure Brother     Social History   Socioeconomic History  . Marital status: Married    Spouse name: Not on file  . Number of children: 2  . Years of education: Not on file  . Highest education level: Not on file  Occupational History  . Occupation: retired Pharmacist, hospital  Tobacco Use  . Smoking status: Never Smoker  . Smokeless tobacco: Never Used  Vaping Use  . Vaping Use: Never used  Substance and Sexual Activity  . Alcohol use: Yes  . Drug use: Never  . Sexual activity: Yes    Birth control/protection: Post-menopausal  Other Topics Concern  . Not on file  Social History Narrative  . Not on file   Social Determinants of Health   Financial Resource Strain: Low Risk   . Difficulty of Paying Living Expenses: Not hard at all  Food Insecurity: No Food Insecurity  . Worried About Charity fundraiser in the Last Year: Never true  . Ran Out of Food in the Last Year: Never true  Transportation Needs: No Transportation Needs  . Lack of Transportation (Medical): No  . Lack of Transportation (Non-Medical): No  Physical Activity: Inactive  . Days of Exercise per Week: 0 days  . Minutes  of Exercise per Session: 0 min  Stress: No Stress Concern Present  . Feeling of Stress : Not at all  Social Connections: Moderately Integrated  . Frequency of Communication with Friends and Family: More than three times a week  . Frequency of Social Gatherings with Friends and Family: Twice a week  . Attends Religious Services: More than 4 times per year  . Active Member of Clubs or Organizations: No  . Attends Archivist Meetings: Never  . Marital Status: Married  Human resources officer Violence: Not At Risk  . Fear of Current or Ex-Partner: No  . Emotionally Abused: No  . Physically  Abused: No  . Sexually Abused: No    Review of Systems:    Constitutional: No weight loss, fever or chills Cardiovascular: No chest pain Respiratory: No SOB  Gastrointestinal: See HPI and otherwise negative   Physical Exam:  Vital signs: BP 122/70   Pulse 72   Ht 5\' 5"  (1.651 m)   Wt 134 lb 6.4 oz (61 kg)   BMI 22.37 kg/m   Constitutional:   Pleasant Caucasian female appears to be in NAD, Well developed, Well nourished, alert and cooperative Respiratory: Respirations even and unlabored. Lungs clear to auscultation bilaterally.   No wheezes, crackles, or rhonchi.  Cardiovascular: Normal S1, S2. No MRG. Regular rate and rhythm. No peripheral edema, cyanosis or pallor.  Gastrointestinal:  Soft, nondistended, nontender. No rebound or guarding. Normal bowel sounds. No appreciable masses or hepatomegaly. Rectal:  Not performed.  Psychiatric: Oriented to person, place and time. Demonstrates good judgement and reason without abnormal affect or behaviors.  RELEVANT LABS AND IMAGING: CBC    Component Value Date/Time   WBC 6.7 11/25/2020 0930   RBC 4.57 11/25/2020 0930   HGB 14.4 11/25/2020 0930   HCT 42.4 11/25/2020 0930   PLT 279.0 11/25/2020 0930   MCV 92.9 11/25/2020 0930   MCHC 34.0 11/25/2020 0930   RDW 13.2 11/25/2020 0930   LYMPHSABS 1.3 11/25/2020 0930   MONOABS 0.5 11/25/2020 0930    EOSABS 0.1 11/25/2020 0930   BASOSABS 0.0 11/25/2020 0930    CMP     Component Value Date/Time   NA 138 11/25/2020 0930   NA 140 11/22/2018 0000   K 4.0 11/25/2020 0930   CL 103 11/25/2020 0930   CO2 27 11/25/2020 0930   GLUCOSE 117 (H) 11/25/2020 0930   BUN 14 11/25/2020 0930   BUN 15 11/22/2018 0000   CREATININE 0.94 11/25/2020 0930   CALCIUM 9.5 11/25/2020 0930   PROT 6.8 11/25/2020 0930   ALBUMIN 4.0 11/25/2020 0930   AST 22 11/25/2020 0930   ALT 20 11/25/2020 0930   ALKPHOS 68 11/25/2020 0930   BILITOT 0.7 11/25/2020 0930    Assessment: 1.  Hoarseness: Consider increased reflux versus other, no change after 2 months of 40 mg Nexium twice daily; consider esophagitis versus other 2.  GERD 3.  Globus sensation  Plan: 1.  Scheduled patient for an EGD in the [place] with Dr. Hilarie Fredrickson.  Did provide the patient with a detailed list of risks for the procedure and she agrees to proceed. 2.  Discussed that she can decrease her Nexium back to 20 mg twice daily as the increase never really helped her and might be giving her some diarrhea. 3.  Patient to follow in clinic per recommendations from Dr. Hilarie Fredrickson after time of procedure.  Ellouise Newer, PA-C [facility] 02/28/2021, 8:55 AM  Cc: Leamon Arnt, MD

## 2021-02-28 NOTE — Patient Instructions (Signed)
If you are age 71 or older, your body mass index should be between 23-30. Your Body mass index is 22.37 kg/m. If this is out of the aforementioned range listed, please consider follow up with your Primary Care Provider.  If you are age 49 or younger, your body mass index should be between 19-25. Your Body mass index is 22.37 kg/m. If this is out of the aformentioned range listed, please consider follow up with your Primary Care Provider.   Decrease Nexium back to 20 mg twice daily.  The Post [facility] providers would like to encourage you to use [facility] to communicate with providers for non-urgent requests or questions.  Due to long hold times on the telephone, sending your provider a message by [facility] may be a faster and more efficient way to get a response.  Please allow 48 business hours for a response.  Please remember that this is for non-urgent requests.   It was a pleasure to see you today!  Thank you for trusting me with your gastrointestinal care!     Ellouise Newer, PA-C

## 2021-03-03 NOTE — Progress Notes (Signed)
Addendum: Reviewed and agree with assessment and management plan. Meygan Kyser M, MD  

## 2021-03-20 DIAGNOSIS — H16223 Keratoconjunctivitis sicca, not specified as Sjogren's, bilateral: Secondary | ICD-10-CM | POA: Diagnosis not present

## 2021-04-01 DIAGNOSIS — E041 Nontoxic single thyroid nodule: Secondary | ICD-10-CM | POA: Diagnosis not present

## 2021-04-03 ENCOUNTER — Other Ambulatory Visit: Payer: Self-pay | Admitting: Otolaryngology

## 2021-04-03 ENCOUNTER — Other Ambulatory Visit: Payer: Self-pay | Admitting: Orthopedic Surgery

## 2021-04-03 DIAGNOSIS — L900 Lichen sclerosus et atrophicus: Secondary | ICD-10-CM | POA: Diagnosis not present

## 2021-04-03 DIAGNOSIS — N9089 Other specified noninflammatory disorders of vulva and perineum: Secondary | ICD-10-CM | POA: Diagnosis not present

## 2021-04-03 DIAGNOSIS — Z124 Encounter for screening for malignant neoplasm of cervix: Secondary | ICD-10-CM | POA: Diagnosis not present

## 2021-04-03 DIAGNOSIS — E041 Nontoxic single thyroid nodule: Secondary | ICD-10-CM

## 2021-04-03 DIAGNOSIS — Z6823 Body mass index (BMI) 23.0-23.9, adult: Secondary | ICD-10-CM | POA: Diagnosis not present

## 2021-04-03 DIAGNOSIS — N904 Leukoplakia of vulva: Secondary | ICD-10-CM | POA: Diagnosis not present

## 2021-04-10 ENCOUNTER — Other Ambulatory Visit: Payer: Self-pay | Admitting: Family Medicine

## 2021-04-14 ENCOUNTER — Ambulatory Visit
Admission: RE | Admit: 2021-04-14 | Discharge: 2021-04-14 | Disposition: A | Discharge status: Home / Self Care | Payer: Medicare PPO | Source: Ambulatory Visit | Attending: Otolaryngology | Admitting: Otolaryngology

## 2021-04-14 DIAGNOSIS — E041 Nontoxic single thyroid nodule: Secondary | ICD-10-CM

## 2021-04-15 DIAGNOSIS — L570 Actinic keratosis: Secondary | ICD-10-CM | POA: Diagnosis not present

## 2021-04-15 DIAGNOSIS — L821 Other seborrheic keratosis: Secondary | ICD-10-CM | POA: Diagnosis not present

## 2021-04-15 DIAGNOSIS — Z85828 Personal history of other malignant neoplasm of skin: Secondary | ICD-10-CM | POA: Diagnosis not present

## 2021-04-15 DIAGNOSIS — D1801 Hemangioma of skin and subcutaneous tissue: Secondary | ICD-10-CM | POA: Diagnosis not present

## 2021-04-15 DIAGNOSIS — L249 Irritant contact dermatitis, unspecified cause: Secondary | ICD-10-CM | POA: Diagnosis not present

## 2021-04-16 ENCOUNTER — Other Ambulatory Visit: Payer: Self-pay | Admitting: Family Medicine

## 2021-04-23 ENCOUNTER — Other Ambulatory Visit: Payer: Self-pay

## 2021-04-23 ENCOUNTER — Ambulatory Visit (AMBULATORY_SURGERY_CENTER): Payer: Medicare PPO | Admitting: Internal Medicine

## 2021-04-23 ENCOUNTER — Encounter: Payer: Self-pay | Admitting: Internal Medicine

## 2021-04-23 VITALS — BP 124/71 | HR 73 | Temp 96.0°F | Resp 16 | Ht 65.0 in | Wt 134.0 lb

## 2021-04-23 DIAGNOSIS — R0989 Other specified symptoms and signs involving the circulatory and respiratory systems: Secondary | ICD-10-CM

## 2021-04-23 DIAGNOSIS — R490 Dysphonia: Secondary | ICD-10-CM

## 2021-04-23 DIAGNOSIS — K219 Gastro-esophageal reflux disease without esophagitis: Secondary | ICD-10-CM

## 2021-04-23 DIAGNOSIS — R198 Other specified symptoms and signs involving the digestive system and abdomen: Secondary | ICD-10-CM

## 2021-04-23 MED ORDER — SODIUM CHLORIDE 0.9 % IV SOLN: 500.0000 mL | Freq: Once | INTRAVENOUS | Status: DC

## 2021-04-23 NOTE — Progress Notes (Signed)
Called to room to assist during endoscopic procedure.  Patient ID and intended procedure confirmed with present staff. Received instructions for my participation in the procedure from the performing physician.  

## 2021-04-23 NOTE — Patient Instructions (Signed)
YOU HAD AN ENDOSCOPIC PROCEDURE TODAY AT THE [facility]:   Refer to the procedure report that was given to you for any specific questions about what was found during the examination.  If the procedure report does not answer your questions, please call your gastroenterologist to clarify.  If you requested that your care partner not be given the details of your procedure findings, then the procedure report has been included in a sealed envelope for you to review at your convenience later.  YOU SHOULD EXPECT: Some feelings of bloating in the abdomen. Passage of more gas than usual.  Walking can help get rid of the air that was put into your GI tract during the procedure and reduce the bloating. If you had a lower endoscopy (such as a colonoscopy or flexible sigmoidoscopy) you may notice spotting of blood in your stool or on the toilet paper. If you underwent a bowel prep for your procedure, you may not have a normal bowel movement for a few days.  Please Note:  You might notice some irritation and congestion in your nose or some drainage.  This is from the oxygen used during your procedure.  There is no need for concern and it should clear up in a day or so.  SYMPTOMS TO REPORT IMMEDIATELY:    Following upper endoscopy (EGD)  Vomiting of blood or coffee ground material  New chest pain or pain under the shoulder blades  Painful or persistently difficult swallowing  New shortness of breath  Fever of 100F or higher  Black, tarry-looking stools  For urgent or emergent issues, a gastroenterologist can be reached at any hour by calling [phone]. Do not use MyChart messaging for urgent concerns.    DIET:  We do recommend a small meal at first, but then you may proceed to your regular diet.  Drink plenty of fluids but you should avoid alcoholic beverages for 24 hours.  ACTIVITY:  You should plan to take it easy for the rest of today and you should NOT DRIVE or use heavy machinery  until tomorrow (because of the sedation medicines used during the test).    FOLLOW UP: Our staff will call the number listed on your records 48-72 hours following your procedure to check on you and address any questions or concerns that you may have regarding the information given to you following your procedure. If we do not reach you, we will leave a message.  We will attempt to reach you two times.  During this call, we will ask if you have developed any symptoms of COVID 19. If you develop any symptoms (ie: fever, flu-like symptoms, shortness of breath, cough etc.) before then, please call [phone].  If you test positive for Covid 19 in the 2 weeks post procedure, please call and report this information to us.    If any biopsies were taken you will be contacted by phone or by letter within the next 1-3 weeks.  Please call us at [phone] if you have not heard about the biopsies in 3 weeks.    SIGNATURES/CONFIDENTIALITY: You and/or your care partner have signed paperwork which will be entered into your electronic medical record.  These signatures attest to the fact that that the information above on your After Visit Summary has been reviewed and is understood.  Full responsibility of the confidentiality of this discharge information lies with you and/or your care-partner. 

## 2021-04-23 NOTE — Op Note (Signed)
[facility] Patient Name: Tina Kelley Procedure Date: 04/23/2021 10:04 AM MRN: [id number] Endoscopist: Jerene Bears , MD Age: 70 Referring MD:  Date of Birth: 07-05-1951 Gender: Female Account #: [id number] Procedure:                Upper GI endoscopy Indications:              Globus sensation, hoarseness, question of GERD,                            symptoms not improved when Nexium increased to 40                            mg BID (now taking 20 mg BID) Medicines:                Monitored Anesthesia Care Procedure:                Pre-Anesthesia Assessment:                           - Prior to the procedure, a History and Physical                            was performed, and patient medications and                            allergies were reviewed. The patient's tolerance of                            previous anesthesia was also reviewed. The risks                            and benefits of the procedure and the sedation                            options and risks were discussed with the patient.                            All questions were answered, and informed consent                            was obtained. Prior Anticoagulants: The patient has                            taken no previous anticoagulant or antiplatelet                            agents. ASA Grade Assessment: II - A patient with                            mild systemic disease. After reviewing the risks                            and benefits, the patient was deemed in  satisfactory condition to undergo the procedure.                           After obtaining informed consent, the endoscope was                            passed under direct vision. Throughout the                            procedure, the patient's blood pressure, pulse, and                            oxygen saturations were monitored continuously. The                            Endoscope was introduced  through the mouth, and                            advanced to the second part of duodenum. The upper                            GI endoscopy was accomplished without difficulty.                            The patient tolerated the procedure well. Scope In: Scope Out: Findings:                 The examined esophagus was normal. Biopsies were                            taken with a cold forceps for histology from the                            lower esophagus to evaluate for chronic acid reflux                            changes. No evidence of Barrett's esophagus.                           The gastroesophageal flap valve was visualized                            endoscopically and classified as Hill Grade II                            (fold present, opens with respiration). Possible                            small, sliding hiatal hernia.                           The entire examined stomach was normal.                           The examined duodenum was  normal. Complications:            No immediate complications. Estimated Blood Loss:     Estimated blood loss was minimal. Impression:               - Normal esophagus. Biopsied.                           - Gastroesophageal flap valve classified as Hill                            Grade II (fold present, opens with respiration).                            Possible, small, sliding hiatal hernia.                           - Normal stomach.                           - Normal examined duodenum. Recommendation:           - Patient has a contact number available for                            emergencies. The signs and symptoms of potential                            delayed complications were discussed with the                            patient. Return to normal activities tomorrow.                            Written discharge instructions were provided to the                            patient.                           - Resume previous  diet.                           - Continue present medications.                           - Await pathology results.                           - If biopsies unrevealing then ENT evaluation is                            recommended if persistent globus sensation and                            hoarseness. Jerene Bears, MD 04/23/2021 12:15:39 PM This report has been signed electronically.

## 2021-04-23 NOTE — Progress Notes (Signed)
PT taken to PACU. Monitors in place. VSS. Report given to RN. 

## 2021-04-23 NOTE — Progress Notes (Signed)
[facility] 

## 2021-04-24 ENCOUNTER — Ambulatory Visit: Payer: Medicare PPO | Admitting: Family Medicine

## 2021-04-25 ENCOUNTER — Telehealth: Payer: Self-pay

## 2021-04-25 NOTE — Telephone Encounter (Signed)
  Follow up Call-  Call back number 04/23/2021 10/27/2019  Post procedure Call Back phone  # [phone]  Permission to leave phone message Yes Yes  Some recent data might be hidden     Patient questions:  Do you have a fever, pain , or abdominal swelling? No. Pain Score  0 *  Have you tolerated food without any problems? Yes.    Have you been able to return to your normal activities? Yes.    Do you have any questions about your discharge instructions: Diet   No. Medications  No. Follow up visit  No.  Do you have questions or concerns about your Care? No.  Actions: * If pain score is 4 or above: No action needed, pain <4.

## 2021-04-28 ENCOUNTER — Other Ambulatory Visit: Payer: Self-pay | Admitting: Family Medicine

## 2021-05-04 ENCOUNTER — Encounter: Payer: Self-pay | Admitting: Internal Medicine

## 2021-05-05 ENCOUNTER — Other Ambulatory Visit: Payer: Self-pay

## 2021-05-05 MED ORDER — PANTOPRAZOLE SODIUM 40 MG PO TBEC: 40.0000 mg | DELAYED_RELEASE_TABLET | Freq: Two times a day (BID) | ORAL | 2 refills | Status: DC

## 2021-05-12 DIAGNOSIS — Z1231 Encounter for screening mammogram for malignant neoplasm of breast: Secondary | ICD-10-CM | POA: Diagnosis not present

## 2021-05-12 LAB — HM MAMMOGRAPHY

## 2021-05-15 ENCOUNTER — Encounter: Payer: Self-pay | Admitting: Family Medicine

## 2021-05-26 ENCOUNTER — Ambulatory Visit: Payer: Medicare PPO | Admitting: Family Medicine

## 2021-05-27 ENCOUNTER — Ambulatory Visit: Payer: Medicare PPO | Admitting: Physician Assistant

## 2021-05-28 ENCOUNTER — Other Ambulatory Visit: Payer: Self-pay | Admitting: Family Medicine

## 2021-05-29 ENCOUNTER — Other Ambulatory Visit: Payer: Self-pay | Admitting: Family Medicine

## 2021-06-05 ENCOUNTER — Other Ambulatory Visit: Payer: Self-pay | Admitting: Family Medicine

## 2021-06-05 NOTE — Telephone Encounter (Signed)
Last Refill 11/25/2020 Last OV 01/23/2021 dx Stress

## 2021-06-06 ENCOUNTER — Other Ambulatory Visit: Payer: Self-pay | Admitting: Family Medicine

## 2021-06-12 DIAGNOSIS — N958 Other specified menopausal and perimenopausal disorders: Secondary | ICD-10-CM | POA: Diagnosis not present

## 2021-06-12 DIAGNOSIS — M8588 Other specified disorders of bone density and structure, other site: Secondary | ICD-10-CM | POA: Diagnosis not present

## 2021-06-16 ENCOUNTER — Ambulatory Visit: Payer: Medicare PPO | Admitting: Family Medicine

## 2021-06-16 ENCOUNTER — Encounter: Payer: Self-pay | Admitting: Family Medicine

## 2021-06-16 ENCOUNTER — Other Ambulatory Visit: Payer: Self-pay

## 2021-06-16 VITALS — BP 124/82 | HR 66 | Temp 97.4°F | Ht 65.0 in | Wt 137.8 lb

## 2021-06-16 DIAGNOSIS — K219 Gastro-esophageal reflux disease without esophagitis: Secondary | ICD-10-CM | POA: Diagnosis not present

## 2021-06-16 DIAGNOSIS — J309 Allergic rhinitis, unspecified: Secondary | ICD-10-CM | POA: Diagnosis not present

## 2021-06-16 DIAGNOSIS — I10 Essential (primary) hypertension: Secondary | ICD-10-CM

## 2021-06-16 DIAGNOSIS — Z23 Encounter for immunization: Secondary | ICD-10-CM

## 2021-06-16 DIAGNOSIS — N904 Leukoplakia of vulva: Secondary | ICD-10-CM | POA: Diagnosis not present

## 2021-06-16 DIAGNOSIS — K9089 Other intestinal malabsorption: Secondary | ICD-10-CM

## 2021-06-16 DIAGNOSIS — F411 Generalized anxiety disorder: Secondary | ICD-10-CM

## 2021-06-16 MED ORDER — EPINEPHRINE 0.3 MG/0.3ML IJ SOAJ: 0.3000 mg | INTRAMUSCULAR | 1 refills | Status: DC | PRN

## 2021-06-16 NOTE — Progress Notes (Signed)
Subjective  CC:  Chief Complaint  Patient presents with   Hypertension   Hyperlipidemia   Anxiety    HPI: Tina Kelley is a 70 y.o. female who presents to the office today to address the problems listed above in the chief complaint. Hypertension f/u: Control is good . Pt reports she is doing well. taking medications as instructed, no medication side effects noted, no TIAs, no chest pain on exertion, no dyspnea on exertion, no swelling of ankles. Reviewed readings over last year in chart. On losartan 50 daily and metoprolol xl 50 daily. She denies adverse effects from his BP medications. Compliance with medication is good.  GAD: doing much better. Fortunately, her husband is responding a bit to chemo. Sleep is now improved. Rare xanax now.  GERD: had EGD, I reviewed GI notes and procedure visit and results. Has calcified valve and HH. Switched to protonix and carafate. Improving.  Diarrhea: better off nexium and stable on questran. No abd pain.  HM:had dexa at GYN: awaiting results. Mammo up to date. Due flu vaccine.shingrix now up to date.  New dx: lichens sclerosus by bx by gyn.   Assessment  1. Benign essential hypertension   2. GAD (generalized anxiety disorder)   3. Gastroesophageal reflux disease, unspecified whether esophagitis present   4. Bile salt-induced diarrhea   5. Chronic allergic rhinitis   6. Lichen sclerosus et atrophicus of the vulva      Plan   Hypertension f/u: BP control is well controlled. Cont losartan and toprol xl at 50 daily for both.  Hyperlipidemia f/u: at goal GAD: now improved again. Contineu wellbutrin sr 150 bid GERD: improving. And EGD did not show sulcers/mass or obstructin.  Continue questran Flu shot today.  Education regarding management of these chronic disease states was given. Management strategies discussed on successive visits include dietary and exercise recommendations, goals of achieving and maintaining IBW, and lifestyle  modifications aiming for adequate sleep and minimizing stressors.   Follow up: as scheduled for cpe in feb/mar  No orders of the defined types were placed in this encounter.  Meds ordered this encounter  Medications   EPINEPHrine (EPIPEN 2-PAK) 0.3 mg/0.3 mL IJ SOAJ injection    Sig: Inject 0.3 mg into the muscle as needed for anaphylaxis. Use as instructed.    Dispense:  1 each    Refill:  1       BP Readings from Last 3 Encounters:  06/16/21 124/82  04/23/21 124/71  02/28/21 122/70   Wt Readings from Last 3 Encounters:  06/16/21 137 lb 12.8 oz (62.5 kg)  04/23/21 134 lb (60.8 kg)  02/28/21 134 lb 6.4 oz (61 kg)    Lab Results  Component Value Date   CHOL 181 11/25/2020   CHOL 198 11/24/2019   CHOL 205 (A) 11/22/2018   Lab Results  Component Value Date   HDL 64.90 11/25/2020   HDL 68.00 11/24/2019   HDL 74 (A) 11/22/2018   Lab Results  Component Value Date   LDLCALC 92 11/24/2019   LDLCALC 98 11/22/2018   Lab Results  Component Value Date   TRIG 250.0 (H) 11/25/2020   TRIG 190.0 (H) 11/24/2019   TRIG 166 (A) 11/22/2018   Lab Results  Component Value Date   CHOLHDL 3 11/25/2020   CHOLHDL 3 11/24/2019   Lab Results  Component Value Date   LDLDIRECT 82.0 11/25/2020   Lab Results  Component Value Date   CREATININE 0.94 11/25/2020   BUN 14 11/25/2020  NA 138 11/25/2020   K 4.0 11/25/2020   CL 103 11/25/2020   CO2 27 11/25/2020    The 10-year ASCVD risk score (Arnett DK, et al., 2019) is: 12.5%   Values used to calculate the score:     Age: 66 years     Sex: Female     Is Non-Hispanic African American: No     Diabetic: No     Tobacco smoker: No     Systolic Blood Pressure: [id number] mmHg     Is BP treated: Yes     HDL Cholesterol: 64.9 mg/dL     Total Cholesterol: 181 mg/dL  I reviewed the patients updated PMH, FH, and SocHx.    Patient Active Problem List   Diagnosis Date Noted   Benign essential hypertension 03/29/2019    Priority: High    Family history of premature CAD 03/29/2019    Priority: High   GAD (generalized anxiety disorder) 04/13/2018    Priority: High   Mixed hyperlipidemia 04/13/2018    Priority: High   Primary insomnia 11/16/2017    Priority: High   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    Priority: Medium   Primary localized osteoarthrosis of multiple sites 09/04/2019    Priority: Medium   Mild intermittent asthma without complication [id number]    Priority: Medium   Gastroesophageal reflux disease 10/29/2015    Priority: Medium   Dyspareunia 03/13/2013    Priority: Medium   Basal cell carcinoma of skin 02/29/2012    Priority: Medium   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    Priority: Low   ACE-inhibitor cough 11/25/2020    Priority: Low   Chronic allergic rhinitis 09/04/2019    Priority: Low   Dry eye syndrome 03/29/2019    Priority: Low   Postmenopausal atrophic vaginitis 03/13/2013    Priority: Low    Allergies: Aspirin, Nsaids, Penicillins, and Lisinopril  Social History: Patient  reports that she has never smoked. She has never used smokeless tobacco. She reports current alcohol use. She reports that she does not use drugs.  Current Meds  Medication Sig   albuterol (VENTOLIN HFA) 108 (90 Base) MCG/ACT inhaler INHALE 2 PUFFS INTO THE LUNGS EVERY 6 HOURS AS NEEDED FOR WHEEZING OR SHORTNESS OF BREATH   ALPRAZolam (XANAX) 0.5 MG tablet Take 1 tablet (0.5 mg total) by mouth daily as needed for anxiety.   buPROPion (WELLBUTRIN SR) 150 MG 12 hr tablet Take 1 tablet (150 mg total) by mouth 2 (two) times daily.   cetirizine (ZYRTEC) 10 MG tablet Take 1 tablet (10 mg total) by mouth daily.   cholestyramine (QUESTRAN) 4 g packet Take 1 packet (4 g total) by mouth 2 (two) times daily as needed.   cycloSPORINE (RESTASIS) 0.05 % ophthalmic emulsion Place 1 drop into both eyes 2 (two) times daily.   fluticasone (FLONASE) 50 MCG/ACT nasal spray Place into the nose.   hydrocortisone  (ANUSOL-HC) 25 MG suppository Place 1 suppository (25 mg total) rectally at bedtime as needed for hemorrhoids.   Lactobacillus Rhamnosus, GG, (CULTURELLE) CAPS Take by mouth.   losartan (COZAAR) 50 MG tablet TAKE ONE TABLET BY MOUTH DAILY   metoprolol succinate (TOPROL-XL) 50 MG 24 hr tablet Take 1 tablet (50 mg total) by mouth daily. Take with or immediately following a meal.   pantoprazole (PROTONIX) 40 MG tablet Take 1 tablet (40 mg total) by mouth 2 (two) times daily.   rosuvastatin (CRESTOR) 10 MG tablet Take 1 tablet (10 mg total)  by mouth daily.   sucralfate (CARAFATE) 1 GM/10ML suspension TAKE TEN MILLILITERS BY MOUTH FOUR TIMES A DAY WITH MEALS AND AT BEDTIME   zolpidem (AMBIEN) 5 MG tablet TAKE ONE TABLET BY MOUTH EVERY NIGHT AT BEDTIME AS NEEDED FOR SLEEP   [DISCONTINUED] EPINEPHrine (EPIPEN 2-PAK) 0.3 mg/0.3 mL IJ SOAJ injection Inject 0.3 mLs (0.3 mg total) into the muscle as needed for anaphylaxis. Use as instructed.    Review of Systems: Cardiovascular: negative for chest pain, palpitations, leg swelling, orthopnea Respiratory: negative for SOB, wheezing or persistent cough Gastrointestinal: negative for abdominal pain Genitourinary: negative for dysuria or gross hematuria  Objective  Vitals: BP 124/82 Comment: rechecked by me in office  Pulse 66   Temp (!) 97.4 F (36.3 C) (Temporal)   Ht '5\' 5"'$  (1.651 m)   Wt 137 lb 12.8 oz (62.5 kg)   SpO2 97%   BMI 22.93 kg/m  General: no acute distress  Psych:  Alert and oriented, normal mood and affect HEENT:  Normocephalic, atraumatic, supple neck  Cardiovascular:  RRR without murmur. no edema Respiratory:  Good breath sounds bilaterally, CTAB with normal respiratory effort Skin:  Warm, no rashes Neurologic:   Mental status is normal Commons side effects, risks, benefits, and alternatives for medications and treatment plan prescribed today were discussed, and the patient expressed understanding of the given instructions. Patient  is instructed to call or message via MyChart if he/she has any questions or concerns regarding our treatment plan. No barriers to understanding were identified. We discussed Red Flag symptoms and signs in detail. Patient expressed understanding regarding what to do in case of urgent or emergency type symptoms.  Medication list was reconciled, printed and provided to the patient in AVS. Patient instructions and summary information was reviewed with the patient as documented in the AVS. This note was prepared with assistance of Dragon voice recognition software. Occasional wrong-word or sound-a-like substitutions may have occurred due to the inherent limitations of voice recognition software  This visit occurred during the SARS-CoV-2 public health emergency.  Safety protocols were in place, including screening questions prior to the visit, additional usage of staff PPE, and extensive cleaning of exam room while observing appropriate contact time as indicated for disinfecting solutions.

## 2021-06-16 NOTE — Patient Instructions (Signed)
Please follow up as scheduled for your next visit with me: 11/28/2021 for your physical with lab work.   Today you were given your Flu vaccination.    If you have any questions or concerns, please don't hesitate to send me a message via MyChart or call the office at [phone]. Thank you for visiting with [place] today! It's our pleasure caring for you.

## 2021-07-01 ENCOUNTER — Encounter: Payer: Self-pay | Admitting: Family Medicine

## 2021-07-07 ENCOUNTER — Ambulatory Visit: Payer: Medicare PPO | Admitting: Family Medicine

## 2021-07-09 ENCOUNTER — Encounter: Payer: Self-pay | Admitting: Family Medicine

## 2021-07-09 ENCOUNTER — Ambulatory Visit: Payer: Medicare PPO | Admitting: Family Medicine

## 2021-07-09 ENCOUNTER — Other Ambulatory Visit: Payer: Self-pay

## 2021-07-09 VITALS — BP 128/90 | HR 68 | Temp 98.1°F | Ht 65.0 in | Wt 137.8 lb

## 2021-07-09 DIAGNOSIS — M546 Pain in thoracic spine: Secondary | ICD-10-CM

## 2021-07-09 DIAGNOSIS — B9689 Other specified bacterial agents as the cause of diseases classified elsewhere: Secondary | ICD-10-CM | POA: Diagnosis not present

## 2021-07-09 DIAGNOSIS — J208 Acute bronchitis due to other specified organisms: Secondary | ICD-10-CM | POA: Diagnosis not present

## 2021-07-09 MED ORDER — AZITHROMYCIN 250 MG PO TABS: ORAL_TABLET | ORAL | 0 refills | Status: DC

## 2021-07-09 NOTE — Progress Notes (Signed)
Subjective  CC:  Chief Complaint  Patient presents with   Back Pain    Ongoing, wants a referral to PT   Sinus Problem    Since October 2nd, runny mucus, yellow mucus with cough. 7 negative covid    HPI: Tina Kelley is a 70 y.o. female who presents to the office today to address the problems listed above in the chief complaint. C/o 10 days of cough, productive of green phelgm ... neg covid. Mildly and slowly improving but still productive cough. No sob or doe. No f/c/s or sinus pain. No gi sxs.  Upper bilateral back pain:under shoulder blades. No injury. Believe stress related. No radiation. Rare meds needed. Has had success w/ PT in back and would like to try again.    Assessment  1. Acute bacterial bronchitis   2. Acute bilateral thoracic back pain      Plan  bronchitis:  treat with zpak due to prolonged course. Supportive care Referred to PT for mechanical upper back pain.   Follow up: Return for as scheduled.  09/18/2021  Orders Placed This Encounter  Procedures   Ambulatory referral to Physical Therapy   Meds ordered this encounter  Medications   azithromycin (ZITHROMAX) 250 MG tablet    Sig: Take 2 tabs today, then 1 tab daily for 4 days    Dispense:  1 each    Refill:  0      I reviewed the patients updated PMH, FH, and SocHx.    Patient Active Problem List   Diagnosis Date Noted   Benign essential hypertension 03/29/2019    Priority: 1.   Family history of premature CAD 03/29/2019    Priority: 1.   GAD (generalized anxiety disorder) 04/13/2018    Priority: 1.   Mixed hyperlipidemia 04/13/2018    Priority: 1.   Primary insomnia 11/16/2017    Priority: 1.   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    Priority: 2.   Primary localized osteoarthrosis of multiple sites 09/04/2019    Priority: 2.   Mild intermittent asthma without complication 63/87/5643    Priority: 2.   Gastroesophageal reflux disease 10/29/2015    Priority: 2.   Dyspareunia  03/13/2013    Priority: 2.   Basal cell carcinoma of skin 02/29/2012    Priority: 2.   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    Priority: 3.   ACE-inhibitor cough 11/25/2020    Priority: 3.   Chronic allergic rhinitis 09/04/2019    Priority: 3.   Dry eye syndrome 03/29/2019    Priority: 3.   Postmenopausal atrophic vaginitis 03/13/2013    Priority: 3.   Current Meds  Medication Sig   albuterol (VENTOLIN HFA) 108 (90 Base) MCG/ACT inhaler INHALE 2 PUFFS INTO THE LUNGS EVERY 6 HOURS AS NEEDED FOR WHEEZING OR SHORTNESS OF BREATH   ALPRAZolam (XANAX) 0.5 MG tablet Take 1 tablet (0.5 mg total) by mouth daily as needed for anxiety.   azithromycin (ZITHROMAX) 250 MG tablet Take 2 tabs today, then 1 tab daily for 4 days   buPROPion (WELLBUTRIN SR) 150 MG 12 hr tablet Take 1 tablet (150 mg total) by mouth 2 (two) times daily.   cetirizine (ZYRTEC) 10 MG tablet Take 1 tablet (10 mg total) by mouth daily.   cholestyramine (QUESTRAN) 4 g packet Take 1 packet (4 g total) by mouth 2 (two) times daily as needed.   cycloSPORINE (RESTASIS) 0.05 % ophthalmic emulsion Place 1 drop into both eyes  2 (two) times daily.   EPINEPHrine (EPIPEN 2-PAK) 0.3 mg/0.3 mL IJ SOAJ injection Inject 0.3 mg into the muscle as needed for anaphylaxis. Use as instructed.   fluticasone (FLONASE) 50 MCG/ACT nasal spray Place into the nose.   hydrocortisone (ANUSOL-HC) 25 MG suppository Place 1 suppository (25 mg total) rectally at bedtime as needed for hemorrhoids.   Lactobacillus Rhamnosus, GG, (CULTURELLE) CAPS Take by mouth.   losartan (COZAAR) 50 MG tablet TAKE ONE TABLET BY MOUTH DAILY   metoprolol succinate (TOPROL-XL) 50 MG 24 hr tablet Take 1 tablet (50 mg total) by mouth daily. Take with or immediately following a meal.   pantoprazole (PROTONIX) 40 MG tablet Take 1 tablet (40 mg total) by mouth 2 (two) times daily.   rosuvastatin (CRESTOR) 10 MG tablet Take 1 tablet (10 mg total) by mouth daily.    sucralfate (CARAFATE) 1 GM/10ML suspension TAKE TEN MILLILITERS BY MOUTH FOUR TIMES A DAY WITH MEALS AND AT BEDTIME   zolpidem (AMBIEN) 5 MG tablet TAKE ONE TABLET BY MOUTH EVERY NIGHT AT BEDTIME AS NEEDED FOR SLEEP    Allergies: Patient is allergic to aspirin, nsaids, penicillins, and lisinopril. Family History: Patient family history includes Healthy in her son and son; Heart attack in her father; Heart disease in her father; High blood pressure in her brother; Lung cancer in her mother. Social History:  Patient  reports that she has never smoked. She has never used smokeless tobacco. She reports current alcohol use. She reports that she does not use drugs.  Review of Systems: Constitutional: Negative for fever malaise or anorexia Cardiovascular: negative for chest pain Respiratory: negative for SOB or persistent cough Gastrointestinal: negative for abdominal pain  Objective  Vitals: BP 128/90   Pulse 68   Temp 98.1 F (36.7 C) (Temporal)   Ht 5\' 5"  (1.651 m)   Wt 137 lb 12.8 oz (62.5 kg)   SpO2 96%   BMI 22.93 kg/m  General: no acute distress , A&Ox3 HEENT: PEERL, conjunctiva normal, neck is supple Cardiovascular:  RRR without murmur or gallop.  Respiratory:  Good breath sounds bilaterally, CTAB with normal respiratory effort Skin:  Warm, no rashes Back: subscapular ttp on right.     Commons side effects, risks, benefits, and alternatives for medications and treatment plan prescribed today were discussed, and the patient expressed understanding of the given instructions. Patient is instructed to call or message via MyChart if he/she has any questions or concerns regarding our treatment plan. No barriers to understanding were identified. We discussed Red Flag symptoms and signs in detail. Patient expressed understanding regarding what to do in case of urgent or emergency type symptoms.  Medication list was reconciled, printed and provided to the patient in AVS. Patient  instructions and summary information was reviewed with the patient as documented in the AVS. This note was prepared with assistance of Dragon voice recognition software. Occasional wrong-word or sound-a-like substitutions may have occurred due to the inherent limitations of voice recognition software  This visit occurred during the SARS-CoV-2 public health emergency.  Safety protocols were in place, including screening questions prior to the visit, additional usage of staff PPE, and extensive cleaning of exam room while observing appropriate contact time as indicated for disinfecting solutions.

## 2021-07-09 NOTE — Patient Instructions (Signed)
Please follow up as scheduled for your next visit with me: 11/28/2021   We will call you to get you scheduled for PT.  If you have any questions or concerns, please don't hesitate to send me a message via MyChart or call the office at [phone]. Thank you for visiting with [place] today! It's our pleasure caring for you.

## 2021-07-21 ENCOUNTER — Other Ambulatory Visit: Payer: Self-pay | Admitting: Family Medicine

## 2021-07-24 ENCOUNTER — Ambulatory Visit: Payer: Medicare PPO | Attending: Internal Medicine

## 2021-07-24 ENCOUNTER — Other Ambulatory Visit (HOSPITAL_BASED_OUTPATIENT_CLINIC_OR_DEPARTMENT_OTHER): Payer: Self-pay

## 2021-07-24 DIAGNOSIS — Z23 Encounter for immunization: Secondary | ICD-10-CM

## 2021-07-24 MED ORDER — MODERNA COVID-19 BIVAL BOOSTER 50 MCG/0.5ML IM SUSP
INTRAMUSCULAR | 0 refills | Status: DC
  Filled 2021-07-24: qty 0.5, 1d supply, fill #0

## 2021-07-24 NOTE — Progress Notes (Signed)
   Covid-19 Vaccination Clinic  Name:  Tina Kelley    MRN: [id number] DOB: June 24, 1951  07/24/2021  [name] was observed post Covid-19 immunization for 15 minutes without incident. She was provided with Vaccine Information Sheet and instruction to access the V-Safe system.   [name] was instructed to call 911 with any severe reactions post vaccine: Difficulty breathing  Swelling of face and throat  A fast heartbeat  A bad rash all over body  Dizziness and weakness   Immunizations Administered     Name Date Dose VIS Date Route   Moderna Covid-19 vaccine Bivalent Booster 07/24/2021 10:39 AM 0.5 mL 05/10/2021 Intramuscular   Manufacturer: Moderna   Lot: [id number]   Ruch: [id number]

## 2021-07-30 ENCOUNTER — Other Ambulatory Visit: Payer: Self-pay | Admitting: Family Medicine

## 2021-08-05 ENCOUNTER — Other Ambulatory Visit: Payer: Self-pay | Admitting: Internal Medicine

## 2021-08-06 ENCOUNTER — Ambulatory Visit (HOSPITAL_BASED_OUTPATIENT_CLINIC_OR_DEPARTMENT_OTHER): Payer: Medicare PPO | Admitting: Cardiology

## 2021-08-06 ENCOUNTER — Encounter (HOSPITAL_BASED_OUTPATIENT_CLINIC_OR_DEPARTMENT_OTHER): Payer: Self-pay | Admitting: Cardiology

## 2021-08-06 ENCOUNTER — Other Ambulatory Visit: Payer: Self-pay

## 2021-08-06 VITALS — BP 128/70 | HR 65 | Ht 65.5 in | Wt 142.3 lb

## 2021-08-06 DIAGNOSIS — Z8249 Family history of ischemic heart disease and other diseases of the circulatory system: Secondary | ICD-10-CM | POA: Diagnosis not present

## 2021-08-06 DIAGNOSIS — Z7189 Other specified counseling: Secondary | ICD-10-CM | POA: Diagnosis not present

## 2021-08-06 DIAGNOSIS — I10 Essential (primary) hypertension: Secondary | ICD-10-CM | POA: Diagnosis not present

## 2021-08-06 DIAGNOSIS — R002 Palpitations: Secondary | ICD-10-CM | POA: Diagnosis not present

## 2021-08-06 NOTE — Progress Notes (Signed)
Cardiology Office Note:    Date:  08/06/2021   ID:  Evoleht Hovatter, DOB [birth year], MRN [id number]  PCP:  Leamon Arnt, MD  Cardiologist:  Buford Dresser, MD  Referring MD: Radene Ou, MD   CC: new patient evaluation for CV risk   History of Present Illness:    Tina Kelley is a 70 y.o. female with a hx of hypertension, GERD, asthma, arthritis, and basal cell carcinoma of skin, who is seen as a new consult at the request of Radene Ou, MD to establish care. She is concerned about her cardiovascular risk.  Records from [facility] Cardiology received and reviewed. Nuclear stress test from 12/29/2016 summarized below  Cardiovascular risk factors: Prior clinical ASCVD: None Comorbid conditions: Hypertension, borderline hyperlipidemia, Metabolic syndrome/Obesity: Highest adult weight 160 lbs. Chronic inflammatory conditions: Arthritis, chronic back pain Tobacco use history: None Family history: Father had 1st heart attack at 4 yo, 2nd heart attack, CABG, CHF, died at 71 yo. Paternal uncles died of heart attack and stroke. Paternal grandfather had a heart attack. Mother died of small-cell lung cancer. Maternal grandmother had pancreatic cancer.  Prior cardiac testing and/or incidental findings on other testing (ie coronary calcium): Normal Stress test 7-8 years ago, Normal Echo Exercise level: Previously walked a lot, not much formal exercise now. Stays active with work around the house. She is unable to lift as much as she used to, but attributes this to her back pain. Current diet: Enjoys pasta, sweets, and broccoli.   Occasionally she feels "flutters" that occur mostly when she is tired, stressed, or hungry. These flutters usually occur 2-3 times a week while sitting at rest, and have a very short duration. Metoprolol is known to help improve her palpitations. Previously her palpitations were worse during menopause.  She endorses GERD, and chocolate is a known  trigger.  She denies any chest pain, or shortness of breath. No lightheadedness, headaches, syncope, orthopnea, PND, lower extremity edema or exertional symptoms.  Past Medical History:  Diagnosis Date   ACE-inhibitor cough 11/25/2020   Allergy    seasonal   Arthritis    Basal cell carcinoma of skin 02/29/2012   basal cell carcinoma on her left melolabial fold approximately 30 years  ago   Diverticulitis    Essential hypertension 03/29/2019   GERD (gastroesophageal reflux disease) 03/29/2019   Chronic, normal EGD. Chronic PPI   Mild intermittent asthma without complication 20/94/7096   Postmenopausal atrophic vaginitis 03/13/2013   Primary insomnia 11/16/2017   Seasonal allergic rhinitis due to pollen 03/29/2019    Past Surgical History:  Procedure Laterality Date   CHOLECYSTECTOMY     COLONOSCOPY     x2   NASAL SINUS SURGERY     UPPER GASTROINTESTINAL ENDOSCOPY      Current Medications: Current Outpatient Medications on File Prior to Visit  Medication Sig   albuterol (VENTOLIN HFA) 108 (90 Base) MCG/ACT inhaler INHALE 2 PUFFS INTO THE LUNGS EVERY 6 HOURS AS NEEDED FOR WHEEZING OR SHORTNESS OF BREATH   buPROPion (WELLBUTRIN SR) 150 MG 12 hr tablet Take 1 tablet (150 mg total) by mouth 2 (two) times daily.   cetirizine (ZYRTEC) 10 MG tablet Take 1 tablet (10 mg total) by mouth daily.   cholestyramine (QUESTRAN) 4 g packet Take 1 packet (4 g total) by mouth 2 (two) times daily as needed.   COVID-19 mRNA bivalent vaccine, Moderna, (MODERNA COVID-19 BIVAL BOOSTER) 50 MCG/0.5ML injection Inject into the muscle.   cycloSPORINE (RESTASIS) 0.05 %  ophthalmic emulsion Place 1 drop into both eyes 2 (two) times daily.   EPINEPHrine (EPIPEN 2-PAK) 0.3 mg/0.3 mL IJ SOAJ injection Inject 0.3 mg into the muscle as needed for anaphylaxis. Use as instructed.   fluticasone (FLONASE) 50 MCG/ACT nasal spray Place into the nose.   hydrocortisone (ANUSOL-HC) 25 MG suppository Place 1 suppository  (25 mg total) rectally at bedtime as needed for hemorrhoids.   Lactobacillus Rhamnosus, GG, (CULTURELLE) CAPS Take by mouth.   losartan (COZAAR) 50 MG tablet TAKE ONE TABLET BY MOUTH DAILY   metoprolol succinate (TOPROL-XL) 50 MG 24 hr tablet Take 1 tablet (50 mg total) by mouth daily. Take with or immediately following a meal.   pantoprazole (PROTONIX) 40 MG tablet TAKE 1 TABLET BY MOUTH TWO TIMES A DAY   rosuvastatin (CRESTOR) 10 MG tablet Take 1 tablet (10 mg total) by mouth daily.   sucralfate (CARAFATE) 1 GM/10ML suspension TAKE TEN MILLILITERS BY MOUTH FOUR TIMES A DAY WITH MEALS AND AT BEDTIME   zolpidem (AMBIEN) 5 MG tablet TAKE ONE TABLET BY MOUTH EVERY NIGHT AT BEDTIME AS NEEDED FOR SLEEP   ALPRAZolam (XANAX) 0.5 MG tablet Take 1 tablet (0.5 mg total) by mouth daily as needed for anxiety. (Patient not taking: Reported on 08/06/2021)   azithromycin (ZITHROMAX) 250 MG tablet Take 2 tabs today, then 1 tab daily for 4 days (Patient not taking: Reported on 08/06/2021)   No current facility-administered medications on file prior to visit.     Allergies:   Aspirin, Nsaids, Penicillins, and Lisinopril   Social History   Tobacco Use   Smoking status: Never   Smokeless tobacco: Never  Vaping Use   Vaping Use: Never used  Substance Use Topics   Alcohol use: Yes    Comment: a glass of wine a couple times per week   Drug use: Never    Family History: family history includes Healthy in her son and son; Heart attack in her father; Heart disease in her father; High blood pressure in her brother; Lung cancer in her mother. There is no history of Colon cancer, Esophageal cancer, Rectal cancer, or Stomach cancer.  ROS:   Please see the history of present illness.  Additional pertinent ROS: Constitutional: Negative for chills, fever, night sweats, unintentional weight loss  HENT: Negative for ear pain and hearing loss.   Eyes: Negative for loss of vision and eye pain.  Respiratory: Negative  for cough, sputum, wheezing.   Cardiovascular: See HPI. Gastrointestinal: Positive for acid reflux. Negative for abdominal pain, melena, and hematochezia.  Genitourinary: Negative for dysuria and hematuria.  Musculoskeletal: Positive for chronic back pain. Negative for falls and myalgias.  Skin: Negative for itching and rash.  Neurological: Negative for focal weakness, focal sensory changes and loss of consciousness.  Endo/Heme/Allergies: Does not bruise/bleed easily.     EKGs/Labs/Other Studies Reviewed:    The following studies were reviewed today: Nuclear stress test 12/29/2016, [facility] protocol, 6 min 30 sec HR baseline 71, peak 153, 100% MPHR BP 116/66 baseline, 142/84 peak No ischemic ECG changes Normal perfusion at rest and stress, TID 0.84, EF 81% No regional wall motion abnormalities  EKG:  EKG is personally reviewed.   08/06/2021: NSR at 65 bpm  Recent Labs: 11/25/2020: ALT 20; BUN 14; Creatinine, Ser 0.94; Hemoglobin 14.4; Platelets 279.0; Potassium 4.0; Sodium 138; TSH 2.08   Recent Lipid Panel    Component Value Date/Time   CHOL 181 11/25/2020 0930   TRIG 250.0 (H) 11/25/2020  0930   HDL 64.90 11/25/2020 0930   CHOLHDL 3 11/25/2020 0930   VLDL 50.0 (H) 11/25/2020 0930   LDLCALC 92 11/24/2019 0929   LDLDIRECT 82.0 11/25/2020 0930    Physical Exam:    VS:  BP 128/70 (BP Location: Left Arm, Patient Position: Sitting, Cuff Size: Normal)   Pulse 65   Ht 5' 5.5" (1.664 m)   Wt 142 lb 4.8 oz (64.5 kg)   SpO2 99%   BMI 23.32 kg/m     Wt Readings from Last 3 Encounters:  08/06/21 142 lb 4.8 oz (64.5 kg)  07/09/21 137 lb 12.8 oz (62.5 kg)  06/16/21 137 lb 12.8 oz (62.5 kg)    GEN: Well nourished, well developed in no acute distress HEENT: Normal, moist mucous membranes NECK: No JVD CARDIAC: regular rhythm, normal S1 and S2, no rubs or gallops. No murmur. VASCULAR: Radial and DP pulses 2+ bilaterally. No carotid bruits RESPIRATORY:   Clear to auscultation without rales, wheezing or rhonchi  ABDOMEN: Soft, non-tender, non-distended MUSCULOSKELETAL:  Ambulates independently SKIN: Warm and dry, no edema NEUROLOGIC:  Alert and oriented x 3. No focal neuro deficits noted. PSYCHIATRIC:  Normal affect    ASSESSMENT:    1. Heart palpitations   2. Essential hypertension   3. Family history of heart disease   4. Cardiac risk counseling   5. Counseling on health promotion and disease prevention    PLAN:    Palpitations/fluttering -on metoprolol succinate 50 mg daily  Hyperlipidemia -on rosuvastatin -cholestyramine is for chronic diarrhea  Hypertension -at goal today -continue losartan 50 mg daily  History of premature CV disease -we discussed calcium score, but as she is already on a statin, this would not likely change management. She would like to wait and see what her next cholesterol numbers are. If they are elevated, she wishes to re-discuss CAC score  Cardiac risk counseling and prevention recommendations: -recommend heart healthy/Mediterranean diet, with whole grains, fruits, vegetable, fish, lean meats, nuts, and olive oil. Limit salt. -recommend moderate walking, 3-5 times/week for 30-50 minutes each session. Aim for at least 150 minutes.week. Goal should be pace of 3 miles/hours, or walking 1.5 miles in 30 minutes -recommend avoidance of tobacco products. Avoid excess alcohol. -ASCVD risk score: The 10-year ASCVD risk score (Arnett DK, et al., 2019) is: 13.3%   Values used to calculate the score:     Age: 67 years     Sex: Female     Is Non-Hispanic African American: No     Diabetic: No     Tobacco smoker: No     Systolic Blood Pressure: 017 mmHg     Is BP treated: Yes     HDL Cholesterol: 64.9 mg/dL     Total Cholesterol: 181 mg/dL    Plan for follow up: 1 year or sooner as needed.  Buford Dresser, MD, PhD, [facility]    Medication Adjustments/Labs and Tests  Ordered: Current medicines are reviewed at length with the patient today.  Concerns regarding medicines are outlined above.   Orders Placed This Encounter  Procedures   EKG 12-Lead    No orders of the defined types were placed in this encounter.  Patient Instructions  Medication Instructions:  Your Physician recommend you continue on your current medication as directed.    *If you need a refill on your cardiac medications before your next appointment, please call your pharmacy*   Lab Work: None ordered today   Testing/Procedures: None  ordered today   Follow-Up: At [facility], you and your health needs are our priority.  As part of our continuing mission to provide you with exceptional heart care, we have created designated Provider Care Teams.  These Care Teams include your primary Cardiologist (physician) and Advanced Practice Providers (APPs -  Physician Assistants and Nurse Practitioners) who all work together to provide you with the care you need, when you need it.  We recommend signing up for the patient portal called "MyChart".  Sign up information is provided on this After Visit Summary.  MyChart is used to connect with patients for Virtual Visits (Telemedicine).  Patients are able to view lab/test results, encounter notes, upcoming appointments, etc.  Non-urgent messages can be sent to your provider as well.   To learn more about what you can do with MyChart, go to [email or web address].    Your next appointment:   1 year(s)  The format for your next appointment:   In Person  Provider:   Buford Dresser, MD      [facility],acting as a scribe for Buford Dresser, MD.,have documented all relevant documentation on the behalf of Buford Dresser, MD,as directed by  Buford Dresser, MD while in the presence of Buford Dresser, MD.  I, Buford Dresser, MD, have reviewed all documentation for this visit. The documentation on  08/06/21 for the exam, diagnosis, procedures, and orders are all accurate and complete.   Signed, Buford Dresser, MD PhD 08/06/2021 5:38 PM    [facility]

## 2021-08-06 NOTE — Patient Instructions (Signed)

## 2021-08-11 IMAGING — US US THYROID
1 series · 13 of 25 positions shown · non-contrast
Comparison: None.

CLINICAL DATA: Nodule on physical examination

EXAM:
THYROID ULTRASOUND
TECHNIQUE: Ultrasound examination of the thyroid gland and adjacent soft
tissues was performed.

[Series 1: us thyroid · 0.04mm/px · 13 of 41 slices shown]
[im 1/41]
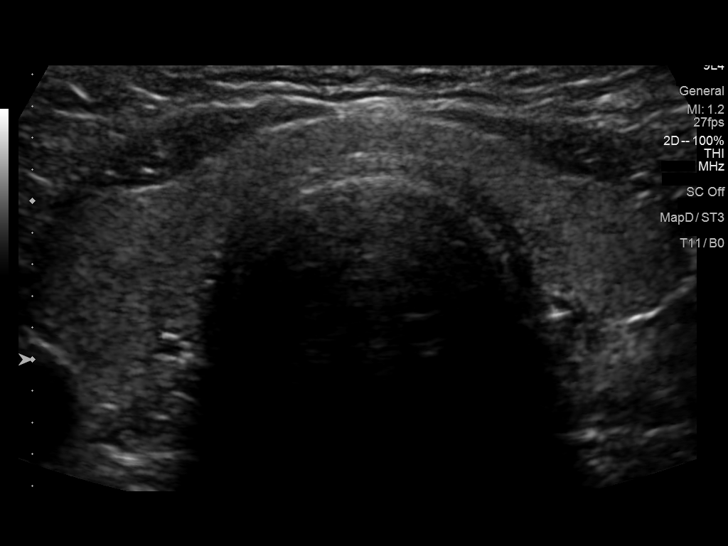
[im 4/41]
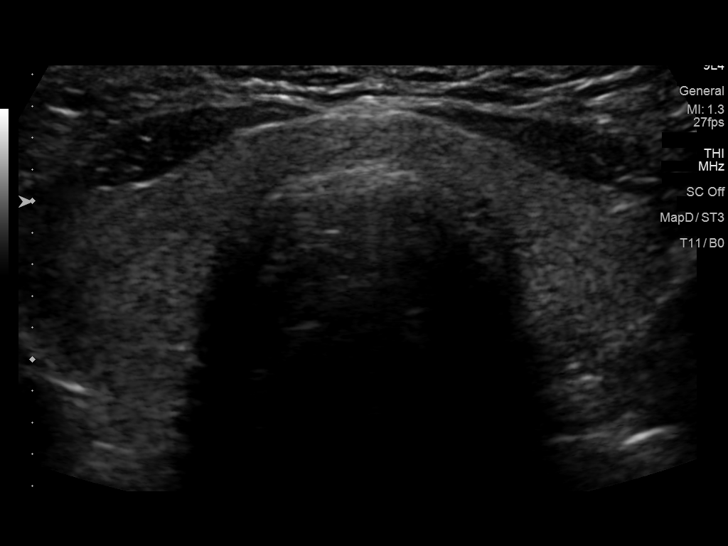
[im 7/41]
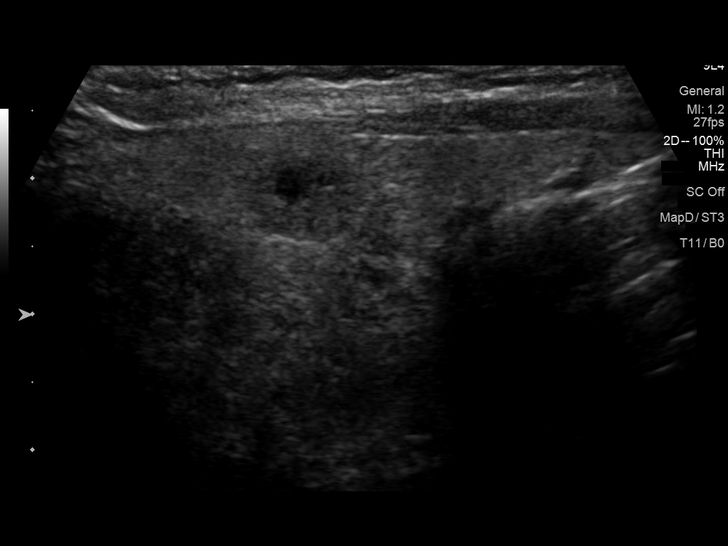
[im 11/41]
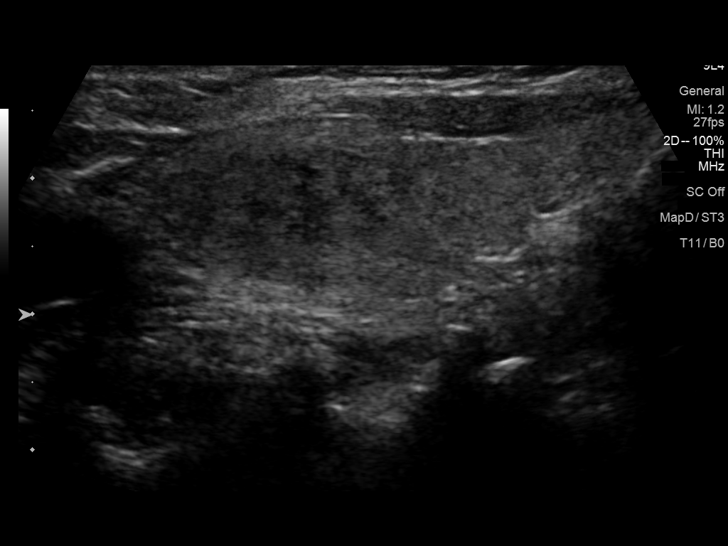
[im 14/41]
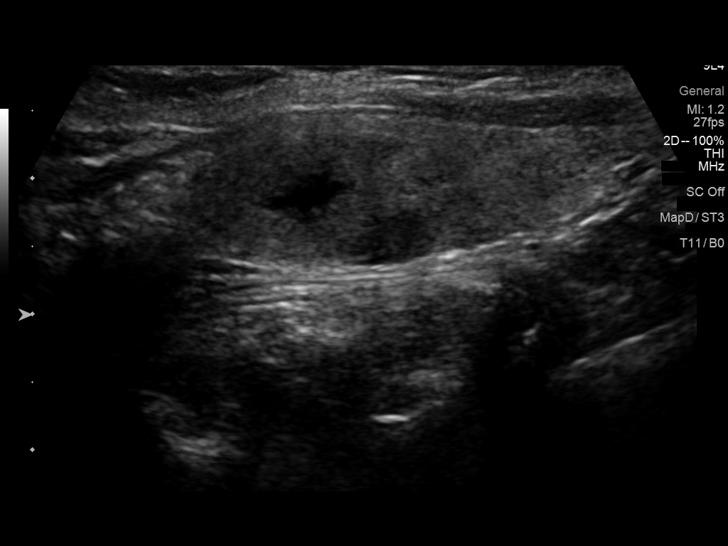
[im 17/41]
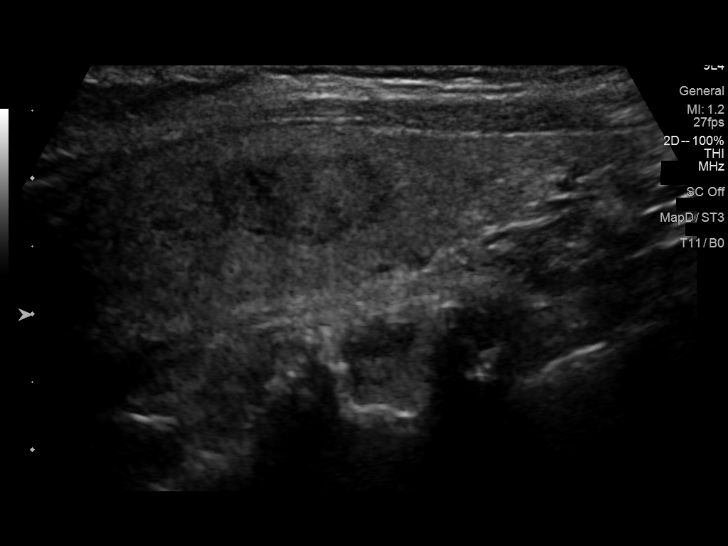
[im 21/41]
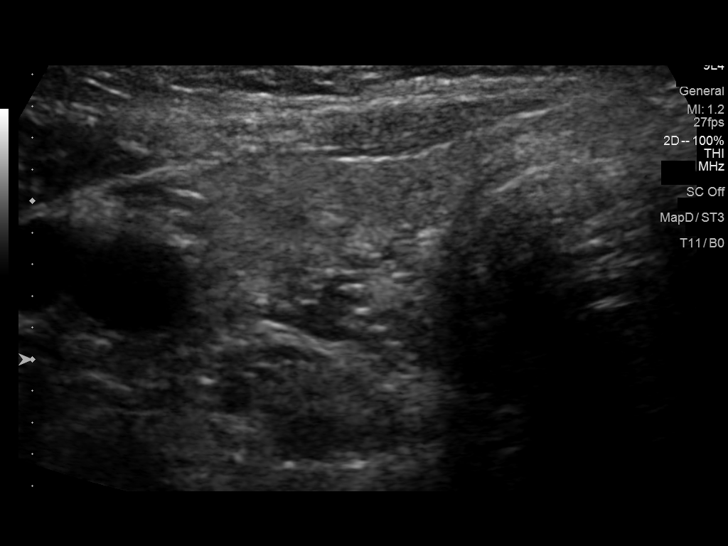
[im 24/41]
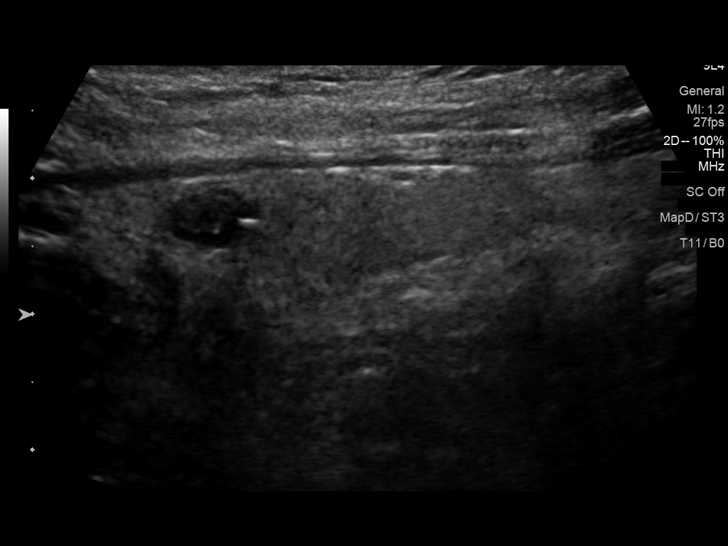
[im 27/41]
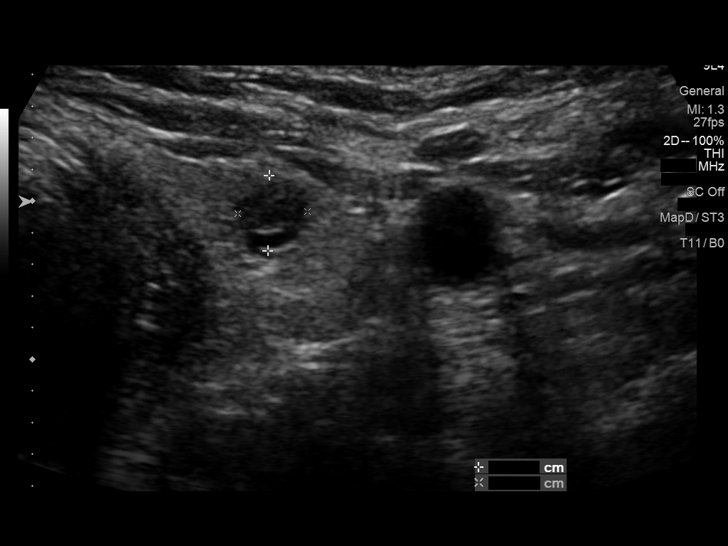
[im 31/41]
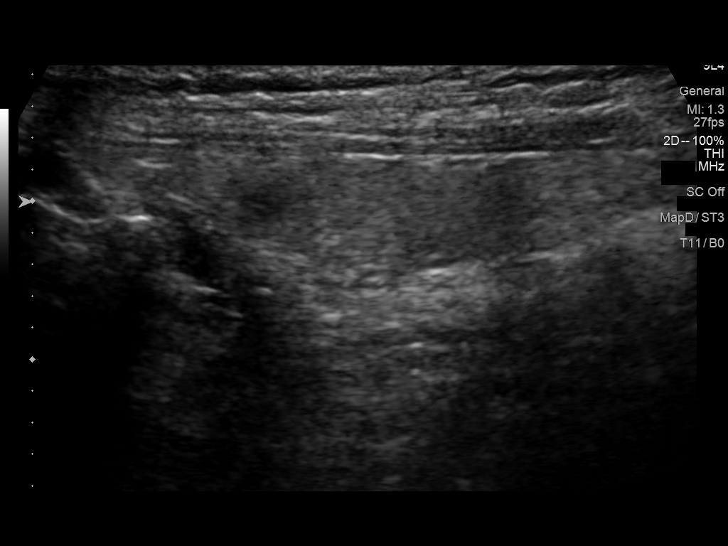
[im 34/41]
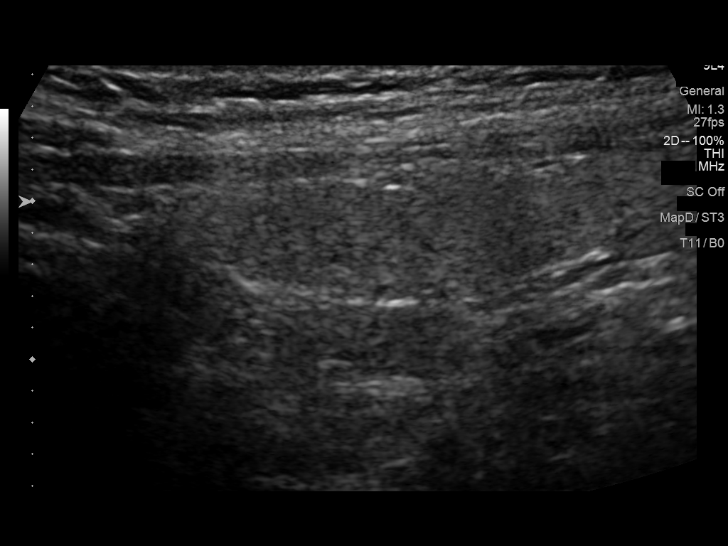
[im 37/41]
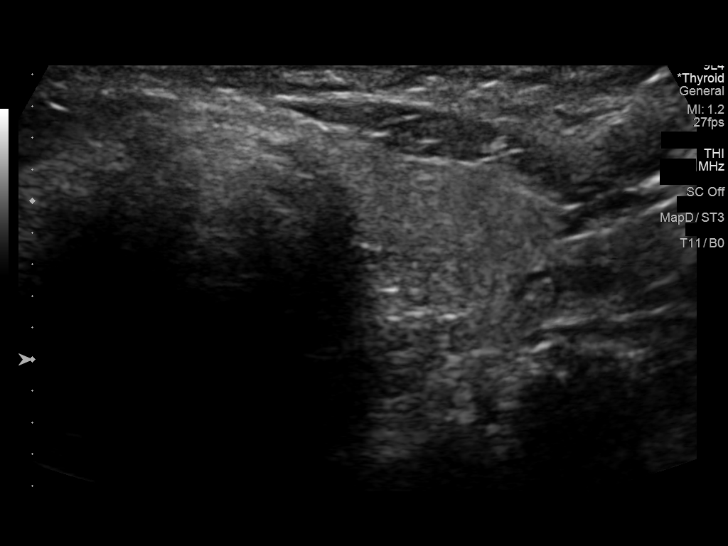
[im 41/41]
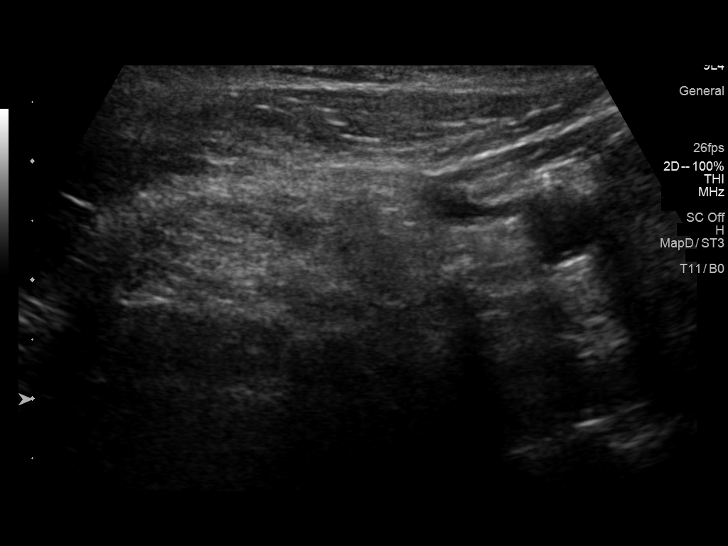

[13 of 25 positions shown; findings below may reference images not displayed]

FINDINGS: Parenchymal Echotexture: Mildly heterogeneous

Isthmus: 0.3 cm

Right lobe: 4.3 x 1.8 x 1.9 cm

Left lobe: 4.5 x 1.7 x 1.3 cm

_________________________________________________________

Estimated total number of nodules >/= 1 cm: 1

Number of spongiform nodules >/=  2 cm not described below (TR1): 0

Number of mixed cystic and solid nodules >/= 1.5 cm not described
below (TR2): 0

_________________________________________________________

Nodule # 1:

Location: Right; mid

Maximum size: 1.6 cm; Other 2 dimensions: 1.2 x 1.0 cm

Composition: solid/almost completely solid (2)

Echogenicity: hypoechoic (2)

Shape: not taller-than-wide (0)

Margins: smooth (0)

Echogenic foci: none (0)

ACR TI-RADS total points: 4.

ACR TI-RADS risk category: TR4 (4-6 points).

ACR TI-RADS recommendations:

**Given size (>/= 1.5 cm) and appearance, fine needle aspiration of
this moderately suspicious nodule should be considered based on
TI-RADS criteria.

_________________________________________________________

Nodule 2: 0.7 x 0.5 x 0.4 cm spongiform nodule in the superior left
thyroid lobe does not meet criteria for FNA or imaging surveillance.
IMPRESSION: Nodule 1 (TI-RADS 4) located in the mid right thyroid lobe meets
criteria for FNA.

The above is in keeping with the ACR TI-RADS recommendations - [HOSPITAL] 9294;[DATE].

## 2021-08-13 ENCOUNTER — Encounter (HOSPITAL_BASED_OUTPATIENT_CLINIC_OR_DEPARTMENT_OTHER): Payer: Self-pay

## 2021-08-28 ENCOUNTER — Encounter: Payer: Self-pay | Admitting: Family Medicine

## 2021-08-28 NOTE — Telephone Encounter (Signed)
Patient called in for an appointment, no openings and patient prefers in office. Anyway we can try to work patient in for an appointment tomorrow? [facility] has had 4 neg covid test since symptoms started 5 days ago.

## 2021-08-29 DIAGNOSIS — Z20822 Contact with and (suspected) exposure to covid-19: Secondary | ICD-10-CM | POA: Diagnosis not present

## 2021-08-29 DIAGNOSIS — R6889 Other general symptoms and signs: Secondary | ICD-10-CM | POA: Diagnosis not present

## 2021-08-29 NOTE — Telephone Encounter (Signed)
Patient was seen at [facility] and scheduled a follow up on the 7th.

## 2021-09-01 ENCOUNTER — Telehealth: Payer: Self-pay | Admitting: Family Medicine

## 2021-09-01 NOTE — Telephone Encounter (Signed)
Copied from [place] [id number]. Topic: Medicare AWV >> Sep 01, 2021  1:13 PM Harris-Coley, Hannah Beat wrote: Reason for CRM: LVM 09/01/21 at 1:14pm to r/s AWV appt 09/18/21 [facility]

## 2021-09-03 ENCOUNTER — Ambulatory Visit: Payer: Medicare PPO | Admitting: Family Medicine

## 2021-09-03 ENCOUNTER — Other Ambulatory Visit: Payer: Self-pay

## 2021-09-03 ENCOUNTER — Encounter: Payer: Self-pay | Admitting: Family Medicine

## 2021-09-03 VITALS — BP 128/80 | HR 69 | Temp 97.6°F | Ht 66.0 in | Wt 137.6 lb

## 2021-09-03 DIAGNOSIS — J4521 Mild intermittent asthma with (acute) exacerbation: Secondary | ICD-10-CM

## 2021-09-03 DIAGNOSIS — B9689 Other specified bacterial agents as the cause of diseases classified elsewhere: Secondary | ICD-10-CM | POA: Diagnosis not present

## 2021-09-03 DIAGNOSIS — J208 Acute bronchitis due to other specified organisms: Secondary | ICD-10-CM

## 2021-09-03 MED ORDER — PREDNISONE 10 MG PO TABS: ORAL_TABLET | ORAL | 0 refills | Status: DC

## 2021-09-03 MED ORDER — DOXYCYCLINE HYCLATE 100 MG PO TABS: 100.0000 mg | ORAL_TABLET | Freq: Two times a day (BID) | ORAL | 0 refills | Status: AC

## 2021-09-03 NOTE — Patient Instructions (Signed)
Please follow up as scheduled for your next visit with me: 11/28/2021 for your physical. Sooner if you are feeling worse.  If you have any questions or concerns, please don't hesitate to send me a message via MyChart or call the office at [phone]. Thank you for visiting with [place] today! It's our pleasure caring for you.

## 2021-09-14 NOTE — Progress Notes (Signed)
Subjective  CC:  Chief Complaint  Patient presents with   Sinusitis    Seen in [facility], ongoing for a week. Symptoms started 11/29, tested negative for covid 12/2 and 12/7.    HPI: Tina Kelley is a 70 y.o. female who presents to the office today to address the problems listed above in the chief complaint. Several weeks of ongoing cough and cold sxs: started end of November. UC eval was neg for covid and flu: supportive care recommended for URI. However, hacking productive cough and some wheezing.  MIA with some tightness in chest now. No fevers or dyspnea. Not improving. No GI sxs  Assessment  1. Acute bacterial bronchitis   2. Asthma, extrinsic with exacerbation, mild intermittent      Plan  Bronchitis and asthma exacerbatin:  doxy, pred, albuterol and monitor may use albuterol q 4. F/u if wheezing or cough persists  Follow up: as scheduled  10/08/2021  No orders of the defined types were placed in this encounter.  Meds ordered this encounter  Medications   doxycycline (VIBRA-TABS) 100 MG tablet    Sig: Take 1 tablet (100 mg total) by mouth 2 (two) times daily for 7 days.    Dispense:  14 tablet    Refill:  0   predniSONE (DELTASONE) 10 MG tablet    Sig: Take 4 tabs qd x 2 days, 3 qd x 2 days, 2 qd x 2d, 1qd x 3 days    Dispense:  21 tablet    Refill:  0      I reviewed the patients updated PMH, FH, and SocHx.    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    Priority: Medium    Primary localized osteoarthrosis of multiple sites 09/04/2019    Priority: Medium    Mild intermittent asthma without complication 24/26/8341    Priority: Medium     Gastroesophageal reflux disease 10/29/2015    Priority: Medium    Dyspareunia 03/13/2013    Priority: Medium    Basal cell carcinoma of skin 02/29/2012    Priority: Medium    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   buPROPion (WELLBUTRIN SR) 150 MG 12 hr tablet Take 1 tablet (150 mg total) by mouth 2 (two) times daily.   cetirizine (ZYRTEC) 10 MG tablet Take 1 tablet (10 mg total) by mouth daily.   cholestyramine (QUESTRAN) 4 g packet Take 1 packet (4 g total) by mouth 2 (two) times daily as needed.   cycloSPORINE (RESTASIS) 0.05 % ophthalmic  emulsion Place 1 drop into both eyes 2 (two) times daily.   [EXPIRED] doxycycline (VIBRA-TABS) 100 MG tablet Take 1 tablet (100 mg total) by mouth 2 (two) times daily for 7 days.   EPINEPHrine (EPIPEN 2-PAK) 0.3 mg/0.3 mL IJ SOAJ injection Inject 0.3 mg into the muscle as needed for anaphylaxis. Use as instructed.   fluticasone (FLONASE) 50 MCG/ACT nasal spray Place into the nose.   hydrocortisone (ANUSOL-HC) 25 MG suppository Place 1 suppository (25 mg total) rectally at bedtime as needed for hemorrhoids.   Lactobacillus Rhamnosus, GG, (CULTURELLE) CAPS Take by mouth.   losartan (COZAAR) 50 MG tablet TAKE ONE TABLET BY MOUTH DAILY   metoprolol succinate (TOPROL-XL) 50 MG 24 hr tablet Take 1 tablet (50 mg total) by mouth daily. Take with or immediately following a meal.   pantoprazole (PROTONIX) 40 MG tablet TAKE 1 TABLET  BY MOUTH TWO TIMES A DAY   predniSONE (DELTASONE) 10 MG tablet Take 4 tabs qd x 2 days, 3 qd x 2 days, 2 qd x 2d, 1qd x 3 days   rosuvastatin (CRESTOR) 10 MG tablet Take 1 tablet (10 mg total) by mouth daily.   sucralfate (CARAFATE) 1 GM/10ML suspension TAKE TEN MILLILITERS BY MOUTH FOUR TIMES A DAY WITH MEALS AND AT BEDTIME   zolpidem (AMBIEN) 5 MG tablet TAKE ONE TABLET BY MOUTH EVERY NIGHT AT BEDTIME AS NEEDED FOR SLEEP   [DISCONTINUED] COVID-19 mRNA bivalent vaccine, Moderna, (MODERNA COVID-19 BIVAL BOOSTER) 50 MCG/0.5ML injection Inject into the muscle.    Allergies: Patient is allergic to aspirin, nsaids, penicillins, and lisinopril. Family History: Patient family history includes Healthy in her son and son; Heart attack in her father; Heart disease in her father; High blood pressure in her brother; Lung cancer in her mother. Social History:  Patient  reports that she has never smoked. She has never used smokeless tobacco. She reports current alcohol use. She reports that she does not use drugs.  Review of Systems: Constitutional: Negative for fever malaise or anorexia Cardiovascular: negative for chest pain Respiratory: negative for SOB or persistent cough Gastrointestinal: negative for abdominal pain  Objective  Vitals: BP 128/80    Pulse 69    Temp 97.6 F (36.4 C) (Temporal)    Ht 5\' 6"  (1.676 m)    Wt 137 lb 9.6 oz (62.4 kg)    SpO2 97%    BMI 22.21 kg/m  General: no acute distress , A&Ox3 HEENT: PEERL, conjunctiva normal, neck is supple Cardiovascular:  RRR without murmur or gallop.  Respiratory:  Good breath sounds bilaterally, CTAB with occ rhonchi and wheeze Skin:  Warm, no rashes    Commons side effects, risks, benefits, and alternatives for medications and treatment plan prescribed today were discussed, and the patient expressed understanding of the given instructions. Patient is instructed to call or message via MyChart if he/she has any questions or concerns  regarding our treatment plan. No barriers to understanding were identified. We discussed Red Flag symptoms and signs in detail. Patient expressed understanding regarding what to do in case of urgent or emergency type symptoms.  Medication list was reconciled, printed and provided to the patient in AVS. Patient instructions and summary information was reviewed with the patient as documented in the AVS. This note was prepared with assistance of Dragon voice recognition software. Occasional wrong-word or sound-a-like substitutions may have occurred due to the inherent limitations of voice recognition software  This visit occurred during the SARS-CoV-2 public health emergency.  Safety protocols were  in place, including screening questions prior to the visit, additional usage of staff PPE, and extensive cleaning of exam room while observing appropriate contact time as indicated for disinfecting solutions.

## 2021-09-15 ENCOUNTER — Encounter: Payer: Self-pay | Admitting: Family Medicine

## 2021-09-18 ENCOUNTER — Ambulatory Visit: Payer: Medicare PPO

## 2021-09-19 ENCOUNTER — Other Ambulatory Visit: Payer: Self-pay | Admitting: Family Medicine

## 2021-10-08 ENCOUNTER — Ambulatory Visit: Payer: Medicare PPO | Admitting: Family Medicine

## 2021-10-17 DIAGNOSIS — E041 Nontoxic single thyroid nodule: Secondary | ICD-10-CM | POA: Diagnosis not present

## 2021-10-17 DIAGNOSIS — K219 Gastro-esophageal reflux disease without esophagitis: Secondary | ICD-10-CM | POA: Diagnosis not present

## 2021-10-21 ENCOUNTER — Other Ambulatory Visit: Payer: Self-pay | Admitting: Otolaryngology

## 2021-10-21 DIAGNOSIS — E041 Nontoxic single thyroid nodule: Secondary | ICD-10-CM

## 2021-10-27 ENCOUNTER — Ambulatory Visit (INDEPENDENT_AMBULATORY_CARE_PROVIDER_SITE_OTHER): Payer: Medicare PPO

## 2021-10-27 ENCOUNTER — Encounter: Payer: Self-pay | Admitting: Family Medicine

## 2021-10-27 ENCOUNTER — Other Ambulatory Visit: Payer: Self-pay

## 2021-10-27 DIAGNOSIS — Z0000 Encounter for general adult medical examination without abnormal findings: Secondary | ICD-10-CM

## 2021-10-27 DIAGNOSIS — G8929 Other chronic pain: Secondary | ICD-10-CM

## 2021-10-27 NOTE — Progress Notes (Addendum)
Virtual Visit via Telephone Note  I connected with  Tina Kelley on 10/27/21 at  8:45 AM EST by telephone and verified that I am speaking with the correct person using two identifiers.  Medicare Annual Wellness visit completed telephonically due to Covid-19 pandemic.   Persons participating in this call: This Health Coach and this patient.   Location: Patient: Home Provider: Office    I discussed the limitations, risks, security and privacy concerns of performing an evaluation and management service by telephone and the availability of in person appointments. The patient expressed understanding and agreed to proceed.  Unable to perform video visit due to video visit attempted and failed and/or patient does not have video capability.   Some vital signs may be absent or patient reported.   Willette Brace, LPN   Subjective:   Tina Kelley is a 71 y.o. female who presents for Medicare Annual (Subsequent) preventive examination.  Review of Systems     Cardiac Risk Factors include: advanced age (>17men, >42 women);hypertension;dyslipidemia     Objective:    There were no vitals filed for this visit. There is no height or weight on file to calculate BMI.  Advanced Directives 10/27/2021 09/12/2020 06/12/2020 06/10/2020 08/07/2019  Does Patient Have a Medical Advance Directive? Yes Yes No No Yes  Type of Paramedic of [place];Living will - - Living will;Healthcare Power of Attorney  Does patient want to make changes to medical advance directive? - - - - No - Patient declined  Copy of [place] in Chart? Yes - validated most recent copy scanned in chart (See row information) No - copy requested - - No - copy requested  Would patient like information on creating a medical advance directive? - - No - Patient declined No - Patient declined -    Current Medications (verified) Outpatient Encounter Medications as of  10/27/2021  Medication Sig   albuterol (VENTOLIN HFA) 108 (90 Base) MCG/ACT inhaler INHALE 2 PUFFS INTO THE LUNGS EVERY 6 HOURS AS NEEDED FOR WHEEZING OR SHORTNESS OF BREATH   ALPRAZolam (XANAX) 0.5 MG tablet Take 1 tablet (0.5 mg total) by mouth daily as needed for anxiety.   buPROPion (WELLBUTRIN SR) 150 MG 12 hr tablet TAKE ONE TABLET BY MOUTH TWICE A DAY   cetirizine (ZYRTEC) 10 MG tablet Take 1 tablet (10 mg total) by mouth daily.   cholestyramine (QUESTRAN) 4 g packet Take 1 packet (4 g total) by mouth 2 (two) times daily as needed.   cycloSPORINE (RESTASIS) 0.05 % ophthalmic emulsion Place 1 drop into both eyes 2 (two) times daily.   EPINEPHrine (EPIPEN 2-PAK) 0.3 mg/0.3 mL IJ SOAJ injection Inject 0.3 mg into the muscle as needed for anaphylaxis. Use as instructed.   fluticasone (FLONASE) 50 MCG/ACT nasal spray Place into the nose.   hydrocortisone (ANUSOL-HC) 25 MG suppository Place 1 suppository (25 mg total) rectally at bedtime as needed for hemorrhoids.   Lactobacillus Rhamnosus, GG, (CULTURELLE) CAPS Take by mouth.   losartan (COZAAR) 50 MG tablet TAKE ONE TABLET BY MOUTH DAILY   metoprolol succinate (TOPROL-XL) 50 MG 24 hr tablet Take 1 tablet (50 mg total) by mouth daily. Take with or immediately following a meal.   pantoprazole (PROTONIX) 40 MG tablet TAKE 1 TABLET BY MOUTH TWO TIMES A DAY   rosuvastatin (CRESTOR) 10 MG tablet Take 1 tablet (10 mg total) by mouth daily.   sucralfate (CARAFATE) 1 GM/10ML suspension TAKE TEN MILLILITERS BY  MOUTH FOUR TIMES A DAY WITH MEALS AND AT BEDTIME   zolpidem (AMBIEN) 5 MG tablet TAKE ONE TABLET BY MOUTH EVERY NIGHT AT BEDTIME AS NEEDED FOR SLEEP   [DISCONTINUED] predniSONE (DELTASONE) 10 MG tablet Take 4 tabs qd x 2 days, 3 qd x 2 days, 2 qd x 2d, 1qd x 3 days   No facility-administered encounter medications on file as of 10/27/2021.    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 Chronic PPI   Mild intermittent asthma without complication 76/16/0737   Postmenopausal atrophic vaginitis 03/13/2013   Primary insomnia 11/16/2017   Seasonal allergic rhinitis due to pollen 03/29/2019   Past Surgical History:  Procedure Laterality Date   CHOLECYSTECTOMY     COLONOSCOPY     x2   NASAL SINUS SURGERY     UPPER GASTROINTESTINAL ENDOSCOPY     Family History  Problem Relation Age of Onset   Lung cancer Mother    Heart disease Father    Heart attack Father    High blood pressure Brother    Healthy Son    Healthy Son    Colon cancer Neg Hx    Esophageal cancer Neg Hx    Rectal cancer Neg Hx    Stomach cancer Neg Hx    Social History   Socioeconomic History   Marital status: Married    Spouse name: Not on file   Number of children: 2   Years of education: Not on file   Highest education level: Not on file  Occupational History   Occupation: retired Pharmacist, hospital  Tobacco Use   Smoking status: Never   Smokeless tobacco: Never  Vaping Use   Vaping Use: Never used  Substance and Sexual Activity   Alcohol use: Yes    Comment: a glass of wine a couple times per week   Drug use: Never   Sexual activity: Yes    Birth control/protection: Post-menopausal  Other Topics Concern   Not on file  Social History Narrative   Not on file   Social Determinants of Health   Financial Resource Strain: Low Risk    Difficulty of Paying Living Expenses: Not hard at all  Food Insecurity: No Food Insecurity   Worried About Charity fundraiser in the Last Year: Never true   Ran Out of Food in the Last Year:  Never true  Transportation Needs: No Transportation Needs   Lack of Transportation (Medical): No   Lack of Transportation (Non-Medical): No  Physical Activity: Inactive   Days of Exercise per Week: 0 days   Minutes of Exercise per Session: 0 min  Stress: No Stress Concern Present   Feeling of Stress : Not at all  Social Connections: Moderately Integrated   Frequency of Communication with Friends and Family: More than three times a week   Frequency of Social Gatherings with Friends and Family: More than three times a week   Attends Religious Services: More than 4 times per year   Active Member of [employer] or Organizations: No   Attends Archivist  Meetings: Never   Marital Status: Married    Tobacco Counseling Counseling given: Not Answered   Clinical Intake:  Pre-visit preparation completed: Yes  Pain : No/denies pain     BMI - recorded: 22.22 Nutritional Status: BMI of 19-24  Normal Nutritional Risks: None Diabetes: No  How often do you need to have someone help you when you read instructions, pamphlets, or other written materials from your doctor or pharmacy?: 1 - Never  Diabetic?No  Interpreter Needed?: No  Information entered by :: Charlott Rakes, LPN   Activities of Daily Living In your present state of health, do you have any difficulty performing the following activities: 10/27/2021 11/25/2020  Hearing? Y N  Comment slight loss -  Vision? N N  Difficulty concentrating or making decisions? N N  Walking or climbing stairs? N N  Dressing or bathing? N N  Doing errands, shopping? N N  Preparing Food and eating ? N -  Using the Toilet? N -  In the past six months, have you accidently leaked urine? N -  Do you have problems with loss of bowel control? N -  Managing your Medications? N -  Managing your Finances? N -  Housekeeping or managing your Housekeeping? N -  Some recent data might be hidden    Patient Care Team: Leamon Arnt, MD as PCP -  General (Family Medicine) Buford Dresser, MD as PCP - Cardiology (Cardiology) Lenon Oms, MD as Consulting Physician (Obstetrics and Gynecology) Randye Lobo, Londell Moh, FNP as Consulting Physician (Dermatology) Trula Slade, DPM as Consulting Physician (Podiatry) Pyrtle, Lajuan Lines, MD as Consulting Physician (Gastroenterology)  Indicate any recent Medical Services you may have received from other than [facility] providers in the past year (date may be approximate).     Assessment:   This is a routine wellness examination for Jaxsyn.  Hearing/Vision screen Hearing Screening - Comments:: Pt stated a slight loss  Vision Screening - Comments:: Pt follows up with Dr Laban Emperor for annual eye exams   Dietary issues and exercise activities discussed: Current Exercise Habits: The patient does not participate in regular exercise at present   Goals Addressed             This Visit's Progress    Patient Stated       None at this time       Depression Screen PHQ 2/9 Scores 10/27/2021 11/25/2020 09/12/2020 08/07/2019 03/29/2019  PHQ - 2 Score 0 0 0 1 2  PHQ- 9 Score - - - - 3    Fall Risk Fall Risk  10/27/2021 11/25/2020 09/12/2020 08/07/2019 03/29/2019  Falls in the past year? 0 0 0 0 0  Number falls in past yr: 0 0 0 - 0  Injury with Fall? 0 0 0 0 0  Risk for fall due to : Impaired vision - Impaired vision - -  Follow up Falls prevention discussed - Falls prevention discussed Falls evaluation completed;Education provided;Falls prevention discussed Falls evaluation completed    FALL RISK PREVENTION PERTAINING TO THE HOME:  Any stairs in or around the home? Yes  If so, are there any without handrails? No  Home free of loose throw rugs in walkways, pet beds, electrical cords, etc? Yes  Adequate lighting in your home to reduce risk of falls? Yes   ASSISTIVE DEVICES UTILIZED TO PREVENT FALLS:  Life alert? No  Use of a cane, walker or w/c? No  Grab bars in the bathroom? Yes  Shower  chair or  bench in shower? Yes  Elevated toilet seat or a handicapped toilet? No   TIMED UP AND GO:  Was the test performed? No .  Cognitive Function: MMSE - Mini Mental State Exam 08/07/2019  Orientation to time 5  Orientation to Place 5  Registration 3  Attention/ Calculation 5  Recall 3  Language- name 2 objects 2  Language- repeat 1  Language- follow 3 step command 3  Language- read & follow direction 1  Write a sentence 1  Copy design 1  Total score 30     6CIT Screen 10/27/2021 09/12/2020  What Year? 0 points 0 points  What month? 0 points 0 points  What time? 0 points -  Count back from 20 0 points 0 points  Months in reverse 0 points 0 points  Repeat phrase 0 points 0 points  Total Score 0 -    Immunizations Immunization History  Administered Date(s) Administered   Fluad Quad(high Dose 65+) 06/19/2019, 06/11/2020, 06/16/2021   Influenza Split 08/30/2012, 08/18/2013, 07/18/2014   Influenza, High Dose Seasonal PF 06/19/2019, 06/11/2020   Influenza, Seasonal, Injecte, Preservative Fre 07/16/2011   Influenza,inj,quad, With Preservative 07/01/2015, 06/30/2016, 06/28/2018   Moderna Covid-19 Vaccine Bivalent Booster 69yrs & up 07/24/2021   Moderna Sars-Covid-2 Vaccination 10/28/2019, 11/30/2019, 08/05/2020, 02/06/2021   Pneumococcal Conjugate-13 10/24/2014   Pneumococcal Polysaccharide-23 10/29/2015   Tdap 01/08/2011   Zoster Recombinat (Shingrix) 12/03/2020, 04/10/2021    TDAP status: Up to date  Flu Vaccine status: Up to date  Pneumococcal vaccine status: Up to date  Covid-19 vaccine status: Completed vaccines  Qualifies for Shingles Vaccine? Yes   Zostavax completed Yes   Shingrix Completed?: Yes  Screening Tests Health Maintenance  Topic Date Due   Hepatitis C Screening  Never done   MAMMOGRAM  05/12/2022   DEXA SCAN  06/10/2023   COLONOSCOPY (Pts 45-5yrs Insurance coverage will need to be confirmed)  10/26/2026   Pneumonia Vaccine 52+ Years old   Completed   INFLUENZA VACCINE  Completed   COVID-19 Vaccine  Completed   Zoster Vaccines- Shingrix  Completed   HPV VACCINES  Aged Out   TETANUS/TDAP  Discontinued    Health Maintenance  Health Maintenance Due  Topic Date Due   Hepatitis C Screening  Never done    Colorectal cancer screening: Type of screening: Colonoscopy. Completed 10/27/19. Repeat every 7 years  Mammogram status: Completed 05/12/21. Repeat every year  Bone Density status: Completed 06/09/21. Results reflect: Bone density results: NORMAL. Repeat every 2 years.   Additional Screening:  Hepatitis C Screening: does qualify;  Vision Screening: Recommended annual ophthalmology exams for early detection of glaucoma and other disorders of the eye. Is the patient up to date with their annual eye exam?  Yes  Who is the provider or what is the name of the office in which the patient attends annual eye exams? Dr Laban Emperor  If pt is not established with a provider, would they like to be referred to a provider to establish care? No .   Dental Screening: Recommended annual dental exams for proper oral hygiene  Community Resource Referral / Chronic Care Management: CRR required this visit?  No   CCM required this visit?  No      Plan:     I have personally reviewed and noted the following in the patients chart:   Medical and social history Use of alcohol, tobacco or illicit drugs  Current medications and supplements including opioid prescriptions.  Functional ability and status Nutritional  status Physical activity Advanced directives List of other physicians Hospitalizations, surgeries, and ER visits in previous 12 months Vitals Screenings to include cognitive, depression, and falls Referrals and appointments  In addition, I have reviewed and discussed with patient certain preventive protocols, quality metrics, and best practice recommendations. A written personalized care plan for preventive services as well  as general preventive health recommendations were provided to patient.     Willette Brace, LPN   [id number]   Nurse Notes: None

## 2021-10-27 NOTE — Patient Instructions (Signed)
Tina Kelley , Thank you for taking time to come for your Medicare Wellness Visit. I appreciate your ongoing commitment to your health goals. Please review the following plan we discussed and let me know if I can assist you in the future.   Screening recommendations/referrals: Colonoscopy: Done 10/27/19 repeat every 7 years  Mammogram: Done 05/12/21 repeat every year  Bone Density: Done 06/09/21 repeat every 2 years  Recommended yearly ophthalmology/optometry visit for glaucoma screening and checkup Recommended yearly dental visit for hygiene and checkup  Vaccinations: Influenza vaccine: Done 06/16/21 repeat every year  Pneumococcal vaccine: Up to date Tdap vaccine: Discontinue  Shingles vaccine: Completed 3/8 & 04/10/21   Covid-19:Completed   Advanced directives: Copies in chart   Conditions/risks identified: none at this time  Next appointment: Follow up in one year for your annual wellness visit    Preventive Care 37 Years and Older, Female Preventive care refers to lifestyle choices and visits with your health care provider that can promote health and wellness. What does preventive care include? A yearly physical exam. This is also called an annual well check. Dental exams once or twice a year. Routine eye exams. Ask your health care provider how often you should have your eyes checked. Personal lifestyle choices, including: Daily care of your teeth and gums. Regular physical activity. Eating a healthy diet. Avoiding tobacco and drug use. Limiting alcohol use. Practicing safe sex. Taking low-dose aspirin every day. Taking vitamin and mineral supplements as recommended by your health care provider. What happens during an annual well check? The services and screenings done by your health care provider during your annual well check will depend on your age, overall health, lifestyle risk factors, and family history of disease. Counseling  Your health care provider may ask you  questions about your: Alcohol use. Tobacco use. Drug use. Emotional well-being. Home and relationship well-being. Sexual activity. Eating habits. History of falls. Memory and ability to understand (cognition). Work and work Statistician. Reproductive health. Screening  You may have the following tests or measurements: Height, weight, and BMI. Blood pressure. Lipid and cholesterol levels. These may be checked every 5 years, or more frequently if you are over 60 years old. Skin check. Lung cancer screening. You may have this screening every year starting at age 80 if you have a 30-pack-year history of smoking and currently smoke or have quit within the past 15 years. Fecal occult blood test (FOBT) of the stool. You may have this test every year starting at age 8. Flexible sigmoidoscopy or colonoscopy. You may have a sigmoidoscopy every 5 years or a colonoscopy every 10 years starting at age 2. Hepatitis C blood test. Hepatitis B blood test. Sexually transmitted disease (STD) testing. Diabetes screening. This is done by checking your blood sugar (glucose) after you have not eaten for a while (fasting). You may have this done every 1-3 years. Bone density scan. This is done to screen for osteoporosis. You may have this done starting at age 32. Mammogram. This may be done every 1-2 years. Talk to your health care provider about how often you should have regular mammograms. Talk with your health care provider about your test results, treatment options, and if necessary, the need for more tests. Vaccines  Your health care provider may recommend certain vaccines, such as: Influenza vaccine. This is recommended every year. Tetanus, diphtheria, and acellular pertussis (Tdap, Td) vaccine. You may need a Td booster every 10 years. Zoster vaccine. You may need this after age 110.  Pneumococcal 13-valent conjugate (PCV13) vaccine. One dose is recommended after age 69. Pneumococcal polysaccharide  (PPSV23) vaccine. One dose is recommended after age 72. Talk to your health care provider about which screenings and vaccines you need and how often you need them. This information is not intended to replace advice given to you by your health care provider. Make sure you discuss any questions you have with your health care provider. Document Released: 10/11/2015 Document Revised: 06/03/2016 Document Reviewed: 07/16/2015 Elsevier Interactive Patient Education  2017 [place] Prevention in the Home Falls can cause injuries. They can happen to people of all ages. There are many things you can do to make your home safe and to help prevent falls. What can I do on the outside of my home? Regularly fix the edges of walkways and driveways and fix any cracks. Remove anything that might make you trip as you walk through a door, such as a raised step or threshold. Trim any bushes or trees on the path to your home. Use bright outdoor lighting. Clear any walking paths of anything that might make someone trip, such as rocks or tools. Regularly check to see if handrails are loose or broken. Make sure that both sides of any steps have handrails. Any raised decks and porches should have guardrails on the edges. Have any leaves, snow, or ice cleared regularly. Use sand or salt on walking paths during winter. Clean up any spills in your garage right away. This includes oil or grease spills. What can I do in the bathroom? Use night lights. Install grab bars by the toilet and in the tub and shower. Do not use towel bars as grab bars. Use non-skid mats or decals in the tub or shower. If you need to sit down in the shower, use a plastic, non-slip stool. Keep the floor dry. Clean up any water that spills on the floor as soon as it happens. Remove soap buildup in the tub or shower regularly. Attach bath mats securely with double-sided non-slip rug tape. Do not have throw rugs and other things on the  floor that can make you trip. What can I do in the bedroom? Use night lights. Make sure that you have a light by your bed that is easy to reach. Do not use any sheets or blankets that are too big for your bed. They should not hang down onto the floor. Have a firm chair that has side arms. You can use this for support while you get dressed. Do not have throw rugs and other things on the floor that can make you trip. What can I do in the kitchen? Clean up any spills right away. Avoid walking on wet floors. Keep items that you use a lot in easy-to-reach places. If you need to reach something above you, use a strong step stool that has a grab bar. Keep electrical cords out of the way. Do not use floor polish or wax that makes floors slippery. If you must use wax, use non-skid floor wax. Do not have throw rugs and other things on the floor that can make you trip. What can I do with my stairs? Do not leave any items on the stairs. Make sure that there are handrails on both sides of the stairs and use them. Fix handrails that are broken or loose. Make sure that handrails are as long as the stairways. Check any carpeting to make sure that it is firmly attached to the stairs. Fix any  carpet that is loose or worn. Avoid having throw rugs at the top or bottom of the stairs. If you do have throw rugs, attach them to the floor with carpet tape. Make sure that you have a light switch at the top of the stairs and the bottom of the stairs. If you do not have them, ask someone to add them for you. What else can I do to help prevent falls? Wear shoes that: Do not have high heels. Have rubber bottoms. Are comfortable and fit you well. Are closed at the toe. Do not wear sandals. If you use a stepladder: Make sure that it is fully opened. Do not climb a closed stepladder. Make sure that both sides of the stepladder are locked into place. Ask someone to hold it for you, if possible. Clearly mark and make  sure that you can see: Any grab bars or handrails. First and last steps. Where the edge of each step is. Use tools that help you move around (mobility aids) if they are needed. These include: Canes. Walkers. Scooters. Crutches. Turn on the lights when you go into a dark area. Replace any light bulbs as soon as they burn out. Set up your furniture so you have a clear path. Avoid moving your furniture around. If any of your floors are uneven, fix them. If there are any pets around you, be aware of where they are. Review your medicines with your doctor. Some medicines can make you feel dizzy. This can increase your chance of falling. Ask your doctor what other things that you can do to help prevent falls. This information is not intended to replace advice given to you by your health care provider. Make sure you discuss any questions you have with your health care provider. Document Released: 07/11/2009 Document Revised: 02/20/2016 Document Reviewed: 10/19/2014 Elsevier Interactive Patient Education  2017 [employer].

## 2021-10-28 NOTE — Telephone Encounter (Signed)
Patient states she discussed PT with Dr. Jonni Sanger at her last visit and would like to see if Dr. Jonni Sanger would just put a referral in for her.

## 2021-10-29 ENCOUNTER — Other Ambulatory Visit (HOSPITAL_COMMUNITY)
Admission: RE | Admit: 2021-10-29 | Discharge: 2021-10-29 | Disposition: A | Discharge status: Home / Self Care | Payer: Medicare PPO | Source: Ambulatory Visit | Attending: Physician Assistant | Admitting: Physician Assistant

## 2021-10-29 ENCOUNTER — Ambulatory Visit
Admission: RE | Admit: 2021-10-29 | Discharge: 2021-10-29 | Disposition: A | Discharge status: Home / Self Care | Payer: Medicare PPO | Source: Ambulatory Visit | Attending: Otolaryngology | Admitting: Otolaryngology

## 2021-10-29 ENCOUNTER — Other Ambulatory Visit: Payer: Self-pay

## 2021-10-29 DIAGNOSIS — E041 Nontoxic single thyroid nodule: Secondary | ICD-10-CM

## 2021-10-29 DIAGNOSIS — C73 Malignant neoplasm of thyroid gland: Secondary | ICD-10-CM | POA: Diagnosis not present

## 2021-10-29 NOTE — Telephone Encounter (Signed)
See note

## 2021-10-31 LAB — CYTOLOGY - NON PAP

## 2021-11-03 ENCOUNTER — Other Ambulatory Visit: Payer: Self-pay

## 2021-11-03 ENCOUNTER — Ambulatory Visit: Payer: Medicare PPO | Admitting: Physical Therapy

## 2021-11-03 ENCOUNTER — Encounter: Payer: Self-pay | Admitting: Physical Therapy

## 2021-11-03 DIAGNOSIS — G8929 Other chronic pain: Secondary | ICD-10-CM

## 2021-11-03 DIAGNOSIS — M546 Pain in thoracic spine: Secondary | ICD-10-CM | POA: Diagnosis not present

## 2021-11-03 NOTE — Therapy (Signed)
OUTPATIENT PHYSICAL THERAPY  EVALUATION   Patient Name: Tina Kelley MRN: [id number] DOB:1951/05/16, 71 y.o., female Today's Date: 11/03/2021   PT End of Session - 11/03/21 1513     Visit Number 1    Number of Visits 12    Date for PT Re-Evaluation 12/15/21    Authorization Type [employer]    PT Start Time 1017    PT Stop Time 1100    PT Time Calculation (min) 43 min    Activity Tolerance Patient tolerated treatment well    Behavior During Therapy [facility] for tasks assessed/performed             Past Medical History:  Diagnosis Date   ACE-inhibitor cough 11/25/2020   Allergy    seasonal   Arthritis    Basal cell carcinoma of skin 02/29/2012   basal cell carcinoma on her left melolabial fold approximately 30 years  ago   Diverticulitis    Essential hypertension 03/29/2019   GERD (gastroesophageal reflux disease) 03/29/2019   Chronic, normal EGD. 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   Primary localized osteoarthrosis of multiple sites 09/04/2019   Chronic allergic rhinitis 09/04/2019   Essential hypertension 03/29/2019   Mild intermittent asthma without complication 25/42/7062   Dry eye syndrome 03/29/2019   Family history of premature CAD 03/29/2019   GAD (generalized anxiety disorder) 04/13/2018   Mixed hyperlipidemia 04/13/2018   Primary insomnia 11/16/2017   Gastroesophageal reflux disease 10/29/2015   Postmenopausal atrophic vaginitis 03/13/2013   Dyspareunia  03/13/2013   Basal cell carcinoma of skin 02/29/2012    PCP: Leamon Arnt, MD  REFERRING PROVIDER: Leamon Arnt, MD  REFERRING DIAG: Thoracic back pain   THERAPY DIAG:  Chronic bilateral thoracic back pain  ONSET DATE:  3 months ago.   SUBJECTIVE:                                                                                                                                                                                           SUBJECTIVE STATEMENT: Pt states ongoing pain in R shoulder blade region for about 3  months. States increased pain with UE use and housework. She is R handed. No radicular pain. Reports no neck pain, (just Tightness in L SO), and no shoulder pain. She has ongoing R hip pain into R thigh as well. She has been doing increased activity at home, and caring for husband.    PERTINENT HISTORY:  None   PAIN:  Are you having pain? Yes NPRS scale: 8/10 Pain location: R shoulder blade/med scap border  Pain orientation: Right  PAIN TYPE: aching Pain description: intermittent  Aggravating factors: Cleaning, UE activity, heavier housework.  Relieving factors: heat,   PRECAUTIONS: None  WEIGHT BEARING RESTRICTIONS No  FALLS:  Has patient fallen in last 6 months? No, Number of falls: 0  PLOF: Independent  PATIENT GOALS  Decreased pain    OBJECTIVE:   DIAGNOSTIC FINDINGS:    COGNITION:  Overall cognitive status: Within functional limits for tasks assessed   POSTURE:  Standing: right hip (slightly) higher than L, Slight R trunk SB;  Seated: tendency for rounded shoulders.   PALPATION: Tightness, tenderness in R medial scap border, trigger points in rhomboid and thoracic paraspinals.  Neg pain and testing for neck and R shoulder.    LUMBARAROM/PROM  AROM A/PROM  11/03/2021  Flexion WFL  Extension WFL  Right lateral flexion WFL  Left lateral flexion Mild limitation  Right rotation WFL  Left rotation Mild limitation   (Blank rows = not  tested)   AROM/PROM: 11/03/21:  Cervical: WNL/nonpainful   Shoulder: WNL/non painful   MMT: 11/03/21:  UE:4/5    TODAY'S TREATMENT  Ther ex: see below for HEP Manual: STM/TPR to R med scap border.    PATIENT EDUCATION:  Education details: PT POC, Exam findings, HEP Person educated: Patient Education method: Explanation, Demonstration, Tactile cues, Verbal cues, and Handouts Education comprehension: verbalized understanding, returned demonstration, verbal cues required, tactile cues required, and needs further education   HOME EXERCISE PROGRAM: Access Code: [email or web address] URL: [email or web address] Date: 11/03/2021 Prepared by: Lyndee Hensen  Exercises Single Arm Shoulder Post Capsule Stretch - 2 x daily - 3 reps - 20 hold Seated Scapular Retraction - 2 x daily - 1 sets - 10 reps Standing Sidebending with Chair Support - 2 x daily - 3 reps - 30 hold   ASSESSMENT:  CLINICAL IMPRESSION: 11/03/21: Pt presents with primary complaint of increased pain in R shoulder blade musculature. She has tightness, tenderness and trigger points in medial scap border, thoracic paraspinals and rhomboid. She has minimal pain in shoulder or neck with testing today. Pt with decreased ability for full functional activities, ADLs and IADLS. Pt to benefit from skilled PT to improve deficits and pain, and to educate on body mechanics for IADLs.    Objective impairments include decreased mobility, decreased strength, increased muscle spasms, impaired flexibility, impaired UE functional use, improper body mechanics, and pain. These impairments are limiting patient from cleaning, community activity, driving, meal prep, occupation, laundry, yard work, and shopping. Personal factors including none - are also affecting patient's functional outcome. Patient will benefit from skilled PT to address above impairments and improve overall function.  REHAB POTENTIAL: Good  CLINICAL DECISION MAKING:  Stable/uncomplicated  EVALUATION COMPLEXITY: Low   GOALS: Goals reviewed with patient? Yes  SHORT TERM GOALS:  STG Name Target Date Goal status  1 Patient will be independent with initial HEP   11/17/21 INITIAL             LONG TERM GOALS:   LTG Name Target Date Goal status  1 Patient will be independent with final HEP  12/29/21 INITIAL  2 Pt to report decreased pain in R shoulder blade region  to 0-2/10   12/29/21 INITIAL  3 Pt to demonstrate improved strength of bil shoulder and scapular muscles to be at least 4+/5, to improve ability for functional UE activity and IADLs.   12/29/21 INITIAL  4 Pt to demonstrate optimal mechanics with lifting, reach, carry with UEs, to improve ability for IADLs.   12/29/21 INITIAL  5         PLAN: PT FREQUENCY: 2x/week  PT DURATION: 8 weeks  PLANNED INTERVENTIONS: Therapeutic exercises, Therapeutic activity, Neuro Muscular re-education, Patient/Family education, Joint mobilization, DME instructions, Dry Needling, Electrical stimulation, Spinal mobilization, Cryotherapy, Moist heat, Taping, Traction, Ultrasound, Ionotophoresis 4mg /ml Dexamethasone, and Manual therapy  PLAN FOR NEXT SESSION:   Lyndee Hensen, PT, DPT 3:22 PM  11/03/21

## 2021-11-06 ENCOUNTER — Encounter: Payer: Self-pay | Admitting: Physical Therapy

## 2021-11-06 ENCOUNTER — Ambulatory Visit: Payer: Medicare PPO | Admitting: Physical Therapy

## 2021-11-06 ENCOUNTER — Other Ambulatory Visit: Payer: Self-pay

## 2021-11-06 DIAGNOSIS — G8929 Other chronic pain: Secondary | ICD-10-CM | POA: Diagnosis not present

## 2021-11-06 DIAGNOSIS — M546 Pain in thoracic spine: Secondary | ICD-10-CM

## 2021-11-06 NOTE — Therapy (Signed)
OUTPATIENT PHYSICAL THERAPY  TREATMENT    Patient Name: Tina Kelley MRN: [id number] DOB:11-03-1950, 71 y.o., female Today's Date: 11/06/2021   PT End of Session - 11/06/21 1612     Visit Number 2    Number of Visits 16    Date for PT Re-Evaluation 12/29/21    Authorization Type [employer]    PT Start Time 1105    PT Stop Time 1145    PT Time Calculation (min) 40 min    Activity Tolerance Patient tolerated treatment well    Behavior During Therapy [facility] for tasks assessed/performed              Past Medical History:  Diagnosis Date   ACE-inhibitor cough 11/25/2020   Allergy    seasonal   Arthritis    Basal cell carcinoma of skin 02/29/2012   basal cell carcinoma on her left melolabial fold approximately 30 years  ago   Diverticulitis    Essential hypertension 03/29/2019   GERD (gastroesophageal reflux disease) 03/29/2019   Chronic, normal EGD. 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   Primary localized osteoarthrosis of multiple sites 09/04/2019   Chronic allergic rhinitis 09/04/2019   Essential hypertension 03/29/2019   Mild intermittent asthma without complication 37/06/6268   Dry eye syndrome 03/29/2019   Family history of premature CAD 03/29/2019   GAD (generalized anxiety disorder) 04/13/2018   Mixed hyperlipidemia 04/13/2018   Primary insomnia 11/16/2017   Gastroesophageal reflux disease 10/29/2015   Postmenopausal atrophic vaginitis 03/13/2013   Dyspareunia  03/13/2013   Basal cell carcinoma of skin 02/29/2012    PCP: Leamon Arnt, MD  REFERRING PROVIDER: Leamon Arnt, MD  REFERRING DIAG: Thoracic back pain   THERAPY DIAG:  Chronic bilateral thoracic back pain  ONSET DATE:  3 months ago.   SUBJECTIVE:                                                                                                                                                                                           SUBJECTIVE STATEMENT: Pt states less pain in shoulder blade region today.  PERTINENT HISTORY:  None   PAIN:  Are you having pain? Yes NPRS scale: 3/10 Pain location: R shoulder blade/med scap border  Pain orientation: Right  PAIN TYPE: aching Pain description: intermittent  Aggravating factors: Cleaning, UE activity, heavier housework.  Relieving factors: heat,   PRECAUTIONS: None  WEIGHT BEARING RESTRICTIONS No  FALLS:  Has patient fallen in last 6 months? No, Number of falls: 0  PLOF: Independent  PATIENT GOALS  Decreased pain    OBJECTIVE:  COGNITION:  Overall cognitive status: Within functional limits for tasks assessed   POSTURE:  Standing: right hip (slightly) higher than L, Slight R trunk SB;  Seated: tendency for rounded shoulders.   PALPATION: Tightness, tenderness in R medial scap border, trigger points in rhomboid and thoracic paraspinals.  Neg pain and testing for neck and R shoulder.    LUMBARAROM/PROM  AROM A/PROM  11/03/2021  Flexion WFL  Extension WFL  Right lateral flexion WFL  Left lateral flexion Mild limitation  Right rotation WFL  Left rotation Mild limitation   (Blank rows = not tested)   AROM/PROM: 11/03/21:  Cervical: WNL/nonpainful   Shoulder: WNL/non painful   MMT: 11/03/21:  UE:4/5    TODAY'S TREATMENT  11/06/21: Therapeutic Exercise: Aerobic: Supine: Bridging x 15;  Seated:  Standing: scap squeeze x 10; Rows RTB x 20; UE scaption x 15; Overhead press no weight x 15;  Stretches:  Posterior shoulder stretch 30 sec x 3 bil; Standing QL stretch at wall 30 sec x 3, Piriformis stretch fig 4, 30 sec x 3;  Neuromuscular Re-education: Manual Therapy: DTM/TPR to R medial scap border. Thoracic PA mobs;     PATIENT EDUCATION:  Education details: updated HEP Person educated: Patient Education method: Explanation, Demonstration, Tactile cues, Verbal cues, and Handouts Education comprehension: verbalized understanding, returned demonstration, verbal cues required, tactile cues required, and needs further education   HOME EXERCISE PROGRAM: Access Code: [email or web address]   ASSESSMENT:  CLINICAL IMPRESSION: 11/06/21: Pt with less pain in t-spine with manual and palpation today. She notes radiating pain around t-spine area at times, unable to reproduce today. Did well with ther ex and light strengthening today. Plan to continue thoracic mobility , postural strengthening well as core strength. Reviewed ther ex for R hip pain today, discussed returning to HEP given in previous PT course.    Objective impairments include decreased mobility, decreased strength, increased muscle spasms, impaired flexibility, impaired UE functional use, improper body mechanics, and pain. These impairments are limiting patient from cleaning, community activity, driving, meal prep, occupation, laundry, yard work, and shopping. Personal factors including none - are also affecting patient's functional outcome. Patient will benefit from skilled PT to address above impairments and improve overall function.  REHAB POTENTIAL: Good  CLINICAL DECISION MAKING: Stable/uncomplicated  EVALUATION COMPLEXITY: Low   GOALS: Goals reviewed with patient? Yes  SHORT TERM GOALS:  STG Name Target Date Goal status  1 Patient will be independent with initial HEP   11/17/21 INITIAL             LONG TERM GOALS:   LTG Name Target Date Goal status  1 Patient will be independent with final HEP  12/29/21 INITIAL  2 Pt to report  decreased pain in R shoulder blade region  to 0-2/10   12/29/21 INITIAL  3 Pt to demonstrate improved strength of bil shoulder and scapular muscles to be at least 4+/5, to improve ability for functional UE activity and IADLs.   12/29/21 INITIAL  4 Pt to  demonstrate optimal mechanics with lifting, reach, carry with UEs, to improve ability for IADLs.   12/29/21 INITIAL  5         PLAN: PT FREQUENCY: 2x/week  PT DURATION: 8 weeks  PLANNED INTERVENTIONS: Therapeutic exercises, Therapeutic activity, Neuro Muscular re-education, Patient/Family education, Joint mobilization, DME instructions, Dry Needling, Electrical stimulation, Spinal mobilization, Cryotherapy, Moist heat, Taping, Traction, Ultrasound, Ionotophoresis 4mg /ml Dexamethasone, and Manual therapy  PLAN FOR NEXT SESSION:   Lyndee Hensen, PT, DPT 4:13 PM  11/06/21

## 2021-11-13 ENCOUNTER — Encounter: Payer: Self-pay | Admitting: Physical Therapy

## 2021-11-13 ENCOUNTER — Ambulatory Visit: Payer: Medicare PPO | Admitting: Physical Therapy

## 2021-11-13 ENCOUNTER — Other Ambulatory Visit: Payer: Self-pay

## 2021-11-13 DIAGNOSIS — M546 Pain in thoracic spine: Secondary | ICD-10-CM

## 2021-11-13 DIAGNOSIS — G8929 Other chronic pain: Secondary | ICD-10-CM | POA: Diagnosis not present

## 2021-11-13 DIAGNOSIS — C73 Malignant neoplasm of thyroid gland: Secondary | ICD-10-CM | POA: Insufficient documentation

## 2021-11-13 NOTE — Therapy (Signed)
OUTPATIENT PHYSICAL THERAPY  TREATMENT    Patient Name: Seven Marengo MRN: [id number] DOB:[birth year], 71 y.o., female Today's Date: 11/13/2021   PT End of Session - 11/13/21 1233     Visit Number 3    Number of Visits 16    Date for PT Re-Evaluation 12/29/21    Authorization Type [employer]    PT Start Time 1106    PT Stop Time 1147    PT Time Calculation (min) 41 min    Activity Tolerance Patient tolerated treatment well    Behavior During Therapy [facility] for tasks assessed/performed               Past Medical History:  Diagnosis Date   ACE-inhibitor cough 11/25/2020   Allergy    seasonal   Arthritis    Basal cell carcinoma of skin 02/29/2012   basal cell carcinoma on her left melolabial fold approximately 30 years  ago   Diverticulitis    Essential hypertension 03/29/2019   GERD (gastroesophageal reflux disease) 03/29/2019   Chronic, normal EGD. 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   Primary localized osteoarthrosis of multiple sites 09/04/2019   Chronic allergic rhinitis 09/04/2019   Essential hypertension 03/29/2019   Mild intermittent asthma without complication 67/61/9509   Dry eye syndrome 03/29/2019   Family history of premature CAD 03/29/2019   GAD (generalized anxiety disorder) 04/13/2018   Mixed hyperlipidemia 04/13/2018   Primary insomnia 11/16/2017   Gastroesophageal reflux disease 10/29/2015   Postmenopausal atrophic vaginitis 03/13/2013   Dyspareunia  03/13/2013   Basal cell carcinoma of skin 02/29/2012    PCP: Leamon Arnt, MD  REFERRING PROVIDER: Leamon Arnt, MD  REFERRING DIAG: Thoracic back pain   THERAPY DIAG:  Chronic bilateral thoracic back pain  ONSET DATE:  3 months ago.   SUBJECTIVE:                                                                                                                                                                                           SUBJECTIVE STATEMENT: Pt states less pain in shoulder blade region. She  is doing better with managing pain when she feels it, is able to get it to subside.   PERTINENT HISTORY:  None   PAIN:  Are you having pain? Yes NPRS scale: up to 5 /10 Pain location: R shoulder blade/med scap border  Pain orientation: Right  PAIN TYPE: aching Pain description: intermittent  Aggravating factors: Cleaning, UE activity, heavier housework.  Relieving factors: heat,   PRECAUTIONS: None  WEIGHT BEARING RESTRICTIONS No  FALLS:  Has patient fallen in last 6 months? No, Number of falls: 0  PLOF: Independent  PATIENT GOALS  Decreased pain    OBJECTIVE:  COGNITION:  Overall cognitive status: Within functional limits for tasks assessed   POSTURE:  Standing: right hip (slightly) higher than L, Slight R trunk SB;  Seated: tendency for rounded shoulders.   PALPATION: Tightness, tenderness in R medial scap border, trigger points in rhomboid and thoracic paraspinals.  Neg pain and testing for neck and R shoulder.    LUMBARAROM/PROM  AROM A/PROM  11/03/2021  Flexion WFL  Extension WFL  Right lateral flexion WFL  Left lateral flexion Mild limitation  Right rotation WFL  Left rotation Mild limitation   (Blank rows = not tested)   AROM/PROM: 11/03/21:  Cervical: WNL/nonpainful   Shoulder: WNL/non painful   MMT: 11/03/21:  UE:4/5    TODAY'S TREATMENT  11/13/21: Therapeutic Exercise: Aerobic: Supine: Bridging x 20;  Supine flys, with tA 2lb x 15;   Shoulder flexion AROM with TA x 15;  Seated:  Thoracic rotation x10 with overpressure, thoracic extension x 10 over towel roll  Standing:  Rows GTB x 20;  Stretches: Posterior shoulder stretch 30 sec x 3 bil;  Standing QL stretch at wall 30 sec x 3,  Seated Piriformis stretch fig 4, 30 sec x 3;  LTR x 10 bil;  Neuromuscular Re-education: Manual Therapy:  DTM/TPR to R medial scap border. Thoracic PA mobs    11/06/21: Therapeutic Exercise: Aerobic: Supine: Bridging x 15;  Seated:  Standing: scap squeeze x 10; Rows RTB x 20; UE scaption x 15; Overhead press no weight x 15;  Stretches: Posterior shoulder stretch 30 sec x 3 bil; Standing QL stretch at wall 30 sec x 3, Piriformis stretch fig 4, 30 sec x 3;  Neuromuscular Re-education: Manual Therapy: DTM/TPR to R medial scap border. Thoracic PA mobs;     PATIENT EDUCATION:  Education details: updated HEP Person educated: Patient Education method: Explanation, Demonstration, Tactile cues, Verbal cues, and Handouts Education comprehension: verbalized understanding, returned demonstration, verbal cues required, tactile cues required, and needs further education   HOME EXERCISE PROGRAM: Access Code: [email or web address]   ASSESSMENT:  CLINICAL IMPRESSION: 11/13/21: Pt with improved pain today at end of session. Does have soreness to palpate at R rhomboid region, but pain changed mostly with thoracic mobility and positioning, which may be less likely muscle pain. Pt will benefit from continued thoracic mobility and strengthening for postural and core muscles.    Objective impairments include decreased mobility, decreased strength, increased muscle spasms, impaired flexibility, impaired UE functional use, improper body mechanics, and pain. These impairments are limiting patient from cleaning, community activity, driving, meal prep, occupation, laundry, yard work, and shopping. Personal factors including none - are also affecting patient's functional  outcome. Patient will benefit from skilled PT to address above impairments and improve overall function.  REHAB POTENTIAL: Good  CLINICAL DECISION MAKING: Stable/uncomplicated  EVALUATION COMPLEXITY: Low   GOALS: Goals reviewed with patient? Yes  SHORT TERM GOALS:  STG  Name Target Date Goal status  1 Patient will be independent with initial HEP   11/17/21 INITIAL             LONG TERM GOALS:   LTG Name Target Date Goal status  1 Patient will be independent with final HEP  12/29/21 INITIAL  2 Pt to report decreased pain in R shoulder blade region  to 0-2/10   12/29/21 INITIAL  3 Pt to demonstrate improved strength of bil shoulder and scapular muscles to be at least 4+/5, to improve ability for functional UE activity and IADLs.   12/29/21 INITIAL  4 Pt to demonstrate optimal mechanics with lifting, reach, carry with UEs, to improve ability for IADLs.   12/29/21 INITIAL  5         PLAN: PT FREQUENCY: 2x/week  PT DURATION: 8 weeks  PLANNED INTERVENTIONS: Therapeutic exercises, Therapeutic activity, Neuro Muscular re-education, Patient/Family education, Joint mobilization, DME instructions, Dry Needling, Electrical stimulation, Spinal mobilization, Cryotherapy, Moist heat, Taping, Traction, Ultrasound, Ionotophoresis 4mg /ml Dexamethasone, and Manual therapy  PLAN FOR NEXT SESSION:   Lyndee Hensen, PT, DPT 12:34 PM  11/13/21

## 2021-11-19 ENCOUNTER — Other Ambulatory Visit: Payer: Self-pay

## 2021-11-19 ENCOUNTER — Encounter: Payer: Self-pay | Admitting: Physical Therapy

## 2021-11-19 ENCOUNTER — Ambulatory Visit: Payer: Medicare PPO | Admitting: Physical Therapy

## 2021-11-19 DIAGNOSIS — M546 Pain in thoracic spine: Secondary | ICD-10-CM | POA: Diagnosis not present

## 2021-11-19 DIAGNOSIS — G8929 Other chronic pain: Secondary | ICD-10-CM | POA: Diagnosis not present

## 2021-11-19 NOTE — Therapy (Signed)
OUTPATIENT PHYSICAL THERAPY  TREATMENT    Patient Name: Tina Kelley MRN: [id number] DOB:07-Aug-1951, 71 y.o., female Today's Date: 11/19/2021   PT End of Session - 11/19/21 0952     Visit Number 4    Number of Visits 16    Date for PT Re-Evaluation 12/29/21    Authorization Type [employer]    PT Start Time 0935    PT Stop Time 1010    PT Time Calculation (min) 35 min    Activity Tolerance Patient tolerated treatment well    Behavior During Therapy [facility] for tasks assessed/performed                Past Medical History:  Diagnosis Date   ACE-inhibitor cough 11/25/2020   Allergy    seasonal   Arthritis    Basal cell carcinoma of skin 02/29/2012   basal cell carcinoma on her left melolabial fold approximately 30 years  ago   Diverticulitis    Essential hypertension 03/29/2019   GERD (gastroesophageal reflux disease) 03/29/2019   Chronic, normal EGD. 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   Primary localized osteoarthrosis of multiple sites 09/04/2019   Chronic allergic rhinitis 09/04/2019   Essential hypertension 03/29/2019   Mild intermittent asthma without complication 63/14/9702   Dry eye syndrome 03/29/2019   Family history of premature CAD 03/29/2019   GAD (generalized anxiety disorder) 04/13/2018   Mixed hyperlipidemia 04/13/2018   Primary insomnia 11/16/2017   Gastroesophageal reflux disease 10/29/2015   Postmenopausal atrophic vaginitis 03/13/2013   Dyspareunia  03/13/2013   Basal cell carcinoma of skin 02/29/2012    PCP: Leamon Arnt, MD  REFERRING PROVIDER: Leamon Arnt, MD  REFERRING DIAG: Thoracic back pain   THERAPY DIAG:  Chronic bilateral thoracic back pain  ONSET DATE:  3 months ago.   SUBJECTIVE:                                                                                                                                                                                           SUBJECTIVE STATEMENT: Pt states less pain this week. She has  had less times of pain, and has been able to relieve pain more easily if she does feel it.   PERTINENT HISTORY:  None   PAIN:  Are you having pain? Yes NPRS scale: up to 3 /10 Pain location: R shoulder blade/med scap border  Pain orientation: Right  PAIN TYPE: aching Pain description: intermittent  Aggravating factors: Cleaning, UE activity, heavier housework.  Relieving factors: heat,   PRECAUTIONS: None  WEIGHT BEARING RESTRICTIONS No  FALLS:  Has patient fallen in last 6 months? No, Number of falls: 0  PLOF: Independent  PATIENT GOALS  Decreased pain    OBJECTIVE:  COGNITION:  Overall cognitive status: Within functional limits for tasks assessed   POSTURE:  Standing: right hip (slightly) higher than L, Slight R trunk SB;  Seated: tendency for rounded shoulders.   PALPATION: Tightness, tenderness in R medial scap border, trigger points in rhomboid and thoracic paraspinals.  Neg pain and testing for neck and R shoulder.    LUMBARAROM/PROM  AROM A/PROM  11/03/2021  Flexion WFL  Extension WFL  Right lateral flexion WFL  Left lateral flexion Mild limitation  Right rotation WFL  Left rotation Mild limitation   (Blank rows = not tested)   AROM/PROM: 11/03/21:  Cervical: WNL/nonpainful   Shoulder: WNL/non painful   MMT: 11/03/21:  UE:4/5    TODAY'S TREATMENT   11/19/21: Therapeutic Exercise: Aerobic: Supine: Bridging x 20;  Chest press 3lb x 15;    Quadruped SA presses x 15;  Seated:  Thoracic rotation x10 with overpressure, thoracic extension x 10 over towel roll  Standing:  Rows GTB x 20;  Stretches:  Cat/Cow x 20; Posterior shoulder stretch 30 sec x 3 bil;  Standing QL stretch at wall 30 sec x 3,  Seated Piriformis stretch fig 4, 30 sec x 3;  LTR x 20 bil;  Neuromuscular Re-education: Manual Therapy:   Thoracic PA mobs    11/13/21: Therapeutic Exercise: Aerobic: Supine: Bridging x 20;  Supine flys, with tA 2lb x 15;  Shoulder flexion AROM with TA x 15;  Seated:  Thoracic rotation x10 with overpressure, thoracic extension x 10 over towel roll  Standing:  Rows GTB x 20;  Stretches: Posterior shoulder stretch 30 sec x 3 bil;  Standing QL stretch at wall 30 sec x 3,  Seated Piriformis stretch fig 4, 30 sec x 3;  LTR x 10 bil;  Neuromuscular Re-education: Manual Therapy:  DTM/TPR to R medial scap border. Thoracic PA mobs    PATIENT EDUCATION:  Education details: reviewed and updated HEP Person educated: Patient Education method: Explanation, Demonstration, Tactile cues, Verbal cues, and Handouts Education comprehension: verbalized understanding, returned demonstration, verbal cues required, tactile cues required, and needs further education   HOME EXERCISE PROGRAM: Access Code: [email or web address]   ASSESSMENT:  CLINICAL IMPRESSION: 11/19/21: Pt showing improvements with pain. She is challenged with strengthening exercises today, and will benefit from continued strengthening. Discussed optimal shoulder/thoracic mechanics for housework. Updated and reviewed HEP today. Pt unable to attend next week, but will return the following week.     Objective impairments include decreased mobility, decreased strength, increased muscle spasms, impaired flexibility, impaired UE functional use, improper body mechanics, and pain. These impairments are limiting patient from cleaning, community activity, driving, meal prep, occupation, laundry, yard work,  and shopping. Personal factors including none - are also affecting patient's functional outcome. Patient will benefit from skilled PT to address above impairments and improve overall function.  REHAB POTENTIAL: Good  CLINICAL DECISION MAKING:  Stable/uncomplicated  EVALUATION COMPLEXITY: Low   GOALS: Goals reviewed with patient? Yes  SHORT TERM GOALS:  STG Name Target Date Goal status  1 Patient will be independent with initial HEP   11/17/21 MET             LONG TERM GOALS:   LTG Name Target Date Goal status  1 Patient will be independent with final HEP  12/29/21 INITIAL  2 Pt to report decreased pain in R shoulder blade region  to 0-2/10   12/29/21 INITIAL  3 Pt to demonstrate improved strength of bil shoulder and scapular muscles to be at least 4+/5, to improve ability for functional UE activity and IADLs.   12/29/21 INITIAL  4 Pt to demonstrate optimal mechanics with lifting, reach, carry with UEs, to improve ability for IADLs.   12/29/21 INITIAL  5         PLAN: PT FREQUENCY: 2x/week  PT DURATION: 8 weeks  PLANNED INTERVENTIONS: Therapeutic exercises, Therapeutic activity, Neuro Muscular re-education, Patient/Family education, Joint mobilization, DME instructions, Dry Needling, Electrical stimulation, Spinal mobilization, Cryotherapy, Moist heat, Taping, Traction, Ultrasound, Ionotophoresis 29m/ml Dexamethasone, and Manual therapy  PLAN FOR NEXT SESSION:   LLyndee Hensen PT, DPT 10:12 AM  11/19/21

## 2021-11-28 ENCOUNTER — Ambulatory Visit: Payer: Medicare PPO | Admitting: Physical Therapy

## 2021-11-28 ENCOUNTER — Ambulatory Visit (INDEPENDENT_AMBULATORY_CARE_PROVIDER_SITE_OTHER): Payer: Medicare PPO | Admitting: Family Medicine

## 2021-11-28 ENCOUNTER — Encounter: Payer: Self-pay | Admitting: Family Medicine

## 2021-11-28 ENCOUNTER — Other Ambulatory Visit: Payer: Self-pay

## 2021-11-28 ENCOUNTER — Encounter: Payer: Self-pay | Admitting: Physical Therapy

## 2021-11-28 VITALS — BP 130/86 | HR 70 | Temp 97.7°F | Ht 63.0 in | Wt 138.0 lb

## 2021-11-28 DIAGNOSIS — F5101 Primary insomnia: Secondary | ICD-10-CM | POA: Diagnosis not present

## 2021-11-28 DIAGNOSIS — E782 Mixed hyperlipidemia: Secondary | ICD-10-CM

## 2021-11-28 DIAGNOSIS — C73 Malignant neoplasm of thyroid gland: Secondary | ICD-10-CM

## 2021-11-28 DIAGNOSIS — G8929 Other chronic pain: Secondary | ICD-10-CM | POA: Diagnosis not present

## 2021-11-28 DIAGNOSIS — M546 Pain in thoracic spine: Secondary | ICD-10-CM | POA: Diagnosis not present

## 2021-11-28 DIAGNOSIS — Z01818 Encounter for other preprocedural examination: Secondary | ICD-10-CM

## 2021-11-28 DIAGNOSIS — Z0000 Encounter for general adult medical examination without abnormal findings: Secondary | ICD-10-CM

## 2021-11-28 DIAGNOSIS — K9089 Other intestinal malabsorption: Secondary | ICD-10-CM

## 2021-11-28 DIAGNOSIS — F411 Generalized anxiety disorder: Secondary | ICD-10-CM | POA: Diagnosis not present

## 2021-11-28 DIAGNOSIS — I10 Essential (primary) hypertension: Secondary | ICD-10-CM

## 2021-11-28 DIAGNOSIS — K219 Gastro-esophageal reflux disease without esophagitis: Secondary | ICD-10-CM

## 2021-11-28 LAB — LIPID PANEL
Cholesterol: 204 mg/dL — ABNORMAL HIGH (ref 0–200)
HDL: 73.3 mg/dL (ref >39.00)
LDL Cholesterol: 97 mg/dL (ref 0–99)
NonHDL: 130.99
Total CHOL/HDL Ratio: 3
Triglycerides: 169 mg/dL — ABNORMAL HIGH (ref 0.0–149.0)
VLDL: 33.8 mg/dL (ref 0.0–40.0)

## 2021-11-28 LAB — CBC WITH DIFFERENTIAL/PLATELET
Basophils Absolute: 0.1 10*3/uL (ref 0.0–0.1)
Basophils Relative: 0.7 % (ref 0.0–3.0)
Eosinophils Absolute: 0.2 10*3/uL (ref 0.0–0.7)
Eosinophils Relative: 2.5 % (ref 0.0–5.0)
HCT: 41.2 % (ref 36.0–46.0)
Hemoglobin: 13.9 g/dL (ref 12.0–15.0)
Lymphocytes Relative: 19.9 % (ref 12.0–46.0)
Lymphs Abs: 1.4 10*3/uL (ref 0.7–4.0)
MCHC: 33.7 g/dL (ref 30.0–36.0)
MCV: 93 fl (ref 78.0–100.0)
Monocytes Absolute: 0.6 10*3/uL (ref 0.1–1.0)
Monocytes Relative: 8 % (ref 3.0–12.0)
Neutro Abs: 5 10*3/uL (ref 1.4–7.7)
Neutrophils Relative %: 68.9 % (ref 43.0–77.0)
Platelets: 295 10*3/uL (ref 150.0–400.0)
RBC: 4.43 Mil/uL (ref 3.87–5.11)
RDW: 12.9 % (ref 11.5–15.5)
WBC: 7.2 10*3/uL (ref 4.0–10.5)

## 2021-11-28 LAB — COMPREHENSIVE METABOLIC PANEL
ALT: 20 U/L (ref 0–35)
AST: 26 U/L (ref 0–37)
Albumin: 4.4 g/dL (ref 3.5–5.2)
Alkaline Phosphatase: 64 U/L (ref 39–117)
BUN: 12 mg/dL (ref 6–23)
CO2: 28 mEq/L (ref 19–32)
Calcium: 9.7 mg/dL (ref 8.4–10.5)
Chloride: 101 mEq/L (ref 96–112)
Creatinine, Ser: 1.02 mg/dL (ref 0.40–1.20)
GFR: 55.11 mL/min — ABNORMAL LOW (ref >60.00)
Glucose, Bld: 106 mg/dL — ABNORMAL HIGH (ref 70–99)
Potassium: 4.5 mEq/L (ref 3.5–5.1)
Sodium: 139 mEq/L (ref 135–145)
Total Bilirubin: 0.9 mg/dL (ref 0.2–1.2)
Total Protein: 6.8 g/dL (ref 6.0–8.3)

## 2021-11-28 LAB — TSH: TSH: 2.22 u[IU]/mL (ref 0.35–5.50)

## 2021-11-28 MED ORDER — SUCRALFATE 1 GM/10ML PO SUSP: ORAL | 5 refills | Status: DC

## 2021-11-28 MED ORDER — ZOLPIDEM TARTRATE 5 MG PO TABS: ORAL_TABLET | ORAL | 5 refills | Status: DC

## 2021-11-28 MED ORDER — METOPROLOL SUCCINATE ER 50 MG PO TB24: 50.0000 mg | ORAL_TABLET | Freq: Every day | ORAL | 3 refills | Status: DC

## 2021-11-28 MED ORDER — ROSUVASTATIN CALCIUM 10 MG PO TABS: 10.0000 mg | ORAL_TABLET | Freq: Every day | ORAL | 3 refills | Status: DC

## 2021-11-28 NOTE — Patient Instructions (Signed)
Please return in 6 months for hypertension follow up.  ? ?I will release your lab results to you on your MyChart account with further instructions. You may see the results before I do, but when I review them I will send you a message with my report or have my assistant call you if things need to be discussed. Please reply to my message with any questions. Thank you!  ? ?Please let me know if you need anything! ? ?Keep an eye on your stress levels and mood.  ? ?If you have any questions or concerns, please don't hesitate to send me a message via MyChart or call the office at [phone]. Thank you for visiting with [place] today! It's our pleasure caring for you.  ?

## 2021-11-28 NOTE — Therapy (Signed)
?OUTPATIENT PHYSICAL THERAPY  TREATMENT  ? ? ?Patient Name: Tina Kelley ?MRN: [id number] ?DOB:1951-04-17, 71 y.o., female ?Today's Date: 11/28/2021 ? ? PT End of Session - 11/28/21 1157   ? ? Visit Number 5   ? Number of Visits 16   ? Date for PT Re-Evaluation 12/29/21   ? Authorization Type [employer]   ? PT Start Time 1105   ? PT Stop Time 1144   ? PT Time Calculation (min) 39 min   ? Activity Tolerance Patient tolerated treatment well   ? Behavior During Therapy [facility] for tasks assessed/performed   ? ?  ?  ? ?  ? ? ? ? ? ? ?Past Medical History:  ?Diagnosis Date  ? ACE-inhibitor cough 11/25/2020  ? Allergy   ? seasonal  ? Arthritis   ? Basal cell carcinoma of skin 02/29/2012  ? basal cell carcinoma on her left melolabial fold approximately 30 years  ago  ? Diverticulitis   ? Essential hypertension 03/29/2019  ? GERD (gastroesophageal reflux disease) 03/29/2019  ? Chronic, normal EGD. Chronic PPI  ? Mild intermittent asthma without complication 33/35/4562  ? Postmenopausal atrophic vaginitis 03/13/2013  ? Primary insomnia 11/16/2017  ? Seasonal allergic rhinitis due to pollen 03/29/2019  ? ?Past Surgical History:  ?Procedure Laterality Date  ? CHOLECYSTECTOMY    ? COLONOSCOPY    ? x2  ? NASAL SINUS SURGERY    ? UPPER GASTROINTESTINAL ENDOSCOPY    ? ?Patient Active Problem List  ? Diagnosis Date Noted  ? Thyroid cancer ([place]) 11/13/2021  ? 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 ACE-inhibitor cough 11/25/2020  ? 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 Primary localized osteoarthrosis of multiple sites 09/04/2019  ? Chronic allergic rhinitis 09/04/2019  ? Essential hypertension 03/29/2019  ? Mild intermittent asthma without complication 56/38/9373  ? Dry eye syndrome 03/29/2019  ? Family history of premature CAD 03/29/2019  ? GAD (generalized anxiety disorder) 04/13/2018  ? Mixed hyperlipidemia 04/13/2018  ? Primary insomnia 11/16/2017  ? Gastroesophageal reflux disease 10/29/2015  ? Postmenopausal atrophic  vaginitis 03/13/2013  ? Dyspareunia 03/13/2013  ? Basal cell carcinoma of skin 02/29/2012  ? ? ?PCP: Leamon Arnt, MD ? ?REFERRING PROVIDER: Leamon Arnt, MD ? ?REFERRING DIAG: Thoracic back pain  ? ?THERAPY DIAG:  ?Chronic bilateral thoracic back pain ? ?ONSET DATE:  3 months ago.  ? ?SUBJECTIVE:                                                                                                                                                                                          ? ?SUBJECTIVE STATEMENT: ?Pt  states less pain overall, she does have some soreness today. She is able to get pain to go away easier when it does hurt.  ? ?PERTINENT HISTORY:  ?None  ? ?PAIN:  ?Are you having pain? Yes ?NPRS scale: up to 0-6 /10 ?Pain location: R shoulder blade/med scap border  ?Pain orientation: Right  ?PAIN TYPE: aching ?Pain description: intermittent  ?Aggravating factors: Cleaning, UE activity, heavier housework.  ?Relieving factors: heat,  ? ?PRECAUTIONS: None ? ?WEIGHT BEARING RESTRICTIONS No ? ?FALLS:  ?Has patient fallen in last 6 months? No, Number of falls: 0 ? ?PLOF: Independent ? ?PATIENT GOALS  Decreased pain  ? ? ?OBJECTIVE: ? ?COGNITION: ? Overall cognitive status: Within functional limits for tasks assessed  ? ?LUMBARAROM/PROM ? ?AROM A/PROM  ?11/03/2021  ?Flexion WFL  ?Extension WFL  ?Right lateral flexion WFL  ?Left lateral flexion Mild limitation  ?Right rotation WFL  ?Left rotation Mild limitation  ? (Blank rows = not tested) ? ? AROM/PROM: ?11/03/21:  Cervical: WNL/nonpainful   Shoulder: WNL/non painful ? ? MMT: ?11/03/21:  UE:4/5  ?11/28/21:  4+/5  ? ? ?TODAY'S TREATMENT  ? ?11/28/21: ?Therapeutic Exercise: ?Aerobic: ?Supine: Bridging x 20;   Quadruped SA presses x 15;  ?Seated:  Seated cat/cow x 10; Thoracic rotation x10 with overpressure,  ?Standing:  Rows BlueTB x 20;  Bil Shoulder ER YTB x 15; ?Stretches:  Cat/Cow x 20;  Posterior shoulder stretch 30 sec x 3 bil;  Seated R QL stretch x 3,  Seated  Piriformis stretch fig 4, 30 sec x 3;  Neuromuscular Re-education: ?Manual Therapy:   Thoracic PA mobs, DTM to R thoracic (mid) paraspinals.  ? ? ?  ?PATIENT EDUCATION:  ?Education details: reviewed and updated HEP ?Person educated: Patient ?Education method: Explanation, Demonstration, Tactile cues, Verbal cues, and Handouts ?Education comprehension: verbalized understanding, returned demonstration, verbal cues required, tactile cues required, and needs further education ? ? ?HOME EXERCISE PROGRAM: ?Access Code: [email or web address] ?Updated 11/28/21. ? ? ?ASSESSMENT: ? ?CLINICAL IMPRESSION: ?11/28/21: Pt having thyroid surgery next week. She feels she is doing better at this time, better managing pain, and pain less intense. She is doing well with HEP and is able to get pain to subside with movement most of the time. Discussed alternate ways of doing HEP for after surgery when she will have lifting restrictions. Reviewed final HEP today. Also discussed seeing sports med/ortho in future if pain does not subside, due to chronicity and little intervention previously. Pt in agreement with plan.  ?  ? ? Objective impairments include decreased mobility, decreased strength, increased muscle spasms, impaired flexibility, impaired UE functional use, improper body mechanics, and pain. These impairments are limiting patient from cleaning, community activity, driving, meal prep, occupation, laundry, yard work, and shopping. Personal factors including none - are also affecting patient's functional outcome. Patient will benefit from skilled PT to address above impairments and improve overall function. ? ?REHAB POTENTIAL: Good ? ?CLINICAL DECISION MAKING: Stable/uncomplicated ? ?EVALUATION COMPLEXITY: Low ? ? ?GOALS: ?Goals reviewed with patient? Yes ? ?SHORT TERM GOALS: ? ?STG Name Target Date Goal status  ?1 Patient will be independent with initial HEP  ? 11/17/21 MET  ?     ?     ? ?LONG TERM GOALS:  ? ?LTG Name Target Date Goal status  ?1  Patient will be independent with final HEP ? 12/29/21 MET  ?2 Pt to report decreased pain in R shoulder blade region  to 0-2/10  ? 12/29/21 MET/partially met  ?3  Pt to demonstrate improved strength of bil shoulder and scapular muscles to be at least 4+/5, to improve ability for functional UE activity and IADLs.  ? 12/29/21 MET  ?4 Pt to demonstrate optimal mechanics with lifting, reach, carry with UEs, to improve ability for IADLs.  ? 12/29/21 MET  ?5  ?    ? ? ? ?PLAN: ?PT FREQUENCY: 2x/week ? ?PT DURATION: 8 weeks ? ?PLANNED INTERVENTIONS: Therapeutic exercises, Therapeutic activity, Neuro Muscular re-education, Patient/Family education, Joint mobilization, DME instructions, Dry Needling, Electrical stimulation, Spinal mobilization, Cryotherapy, Moist heat, Taping, Traction, Ultrasound, Ionotophoresis 71m/ml Dexamethasone, and Manual therapy ? ?PLAN FOR NEXT SESSION:  ? ?LLyndee Hensen PT, DPT ?11:59 AM  11/28/21 ? ?PHYSICAL THERAPY DISCHARGE SUMMARY ? ?Visits from Start of Care: 5 ?Plan: ?Patient agrees to discharge.  Patient goals were  met. Patient is being discharged due to meeting the stated rehab goals.    ? ? ?LLyndee Hensen PT, DPT ?12:03 PM  11/28/21 ? ? ? ?

## 2021-11-28 NOTE — Progress Notes (Signed)
Subjective  Chief Complaint  Patient presents with   Annual Exam    HPI: Tina Kelley is a 71 y.o. female who presents to [facility] today for a Female Wellness Visit. She also has the concerns and/or needs as listed above in the chief complaint. These will be addressed in addition to the Health Maintenance Visit.   Wellness Visit: annual visit with health maintenance review and exam without Pap  Health maintenance: Screens are current.  Immunizations are current. Chronic disease f/u and/or acute problem visit: (deemed necessary to be done in addition to the wellness visit): Hypertension: Recently had cardiology evaluation.  Blood pressures been well controlled on metoprolol and losartan.Feeling well. Taking medications w/o adverse effects. No symptoms of CHF, angina; no palpitations, sob, cp or lower extremity edema. Compliant with meds.  Reviewed ENT evaluation: Had thyroid nodule reevaluated.  Has been having thyroid nodule surveillance over the last 9 years, nodule was found in [place].  Never biopsied.  Last summer she went for an ultrasound and biopsy was recommended.  Follow-up this year shows thyroid cancer.  She is scheduled for thyroid lobectomy in a few weeks.  I reviewed multiple notes.  Test is 6 thyroid carcinoma.  Partial thyroidectomy should be curative.  She is appropriately upset about the diagnosis. Stressors: Continues to have multiple stressors including her husband who is dealing with lung cancer treatment.  However, she feels her mood is stable. Sleep is controlled with Ambien.  She request refill. GERD and bile salt induced diarrhea: Continues with gastroenterology.  Stable on medications.  Symptoms come and go. Tolerating Crestor for hyperlipidemia.  Here for recheck this morning  Assessment  1. Annual physical exam   2. Essential hypertension   3. Mixed hyperlipidemia   4. GAD (generalized anxiety disorder)   5. Bile salt-induced  diarrhea   6. Thyroid cancer ([place])   7. Gastroesophageal reflux disease, unspecified whether esophagitis present   8. Primary insomnia      Plan  Female Wellness Visit: Age appropriate Health Maintenance and Prevention measures were discussed with patient. Included topics are cancer screening recommendations, ways to keep healthy (see AVS) including dietary and exercise recommendations, regular eye and dental care, use of seat belts, and avoidance of moderate alcohol use and tobacco use.  BMI: discussed patient's BMI and encouraged positive lifestyle modifications to help get to or maintain a target BMI. HM needs and immunizations were addressed and ordered. See below for orders. See HM and immunization section for updates. Routine labs and screening tests ordered including cmp, cbc and lipids where appropriate. Discussed recommendations regarding Vit D and calcium supplementation (see AVS)  Chronic disease management visit and/or acute problem visit: Hypertension is well controlled: Continue metoprolol 50 and losartan 50 daily.  Recheck renal function electrolytes Hyperlipidemia Crestor 10: Refilled.  Recheck lipids today with LFTs Thyroid cancer: Reviewed multiple notes.  Preop labs ordered.  Average risk for surgery.  Medical clearance given. Continue with GI for GERD and diarrhea meds.  Stable Refilled Ambien 10 for nightly use for insomnia. Anxiety and stressors: Counseling done.  Consider SSRI if worsens.  Education given.  Patient defers for now.  Follow up: 6 months for recheck and blood pressure follow-up Orders Placed This Encounter  Procedures   CBC with Differential/Platelet   Comprehensive metabolic panel   Lipid panel   TSH   Hepatitis C antibody   No orders of the defined types were placed in this encounter.  Body mass index is 24.45 kg/m. Wt Readings from Last 3 Encounters:  11/28/21 138 lb (62.6 kg)  09/03/21 137 lb 9.6 oz (62.4 kg)  08/06/21 142 lb 4.8 oz  (64.5 kg)     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    Priority: Medium     On questran 3x/week    Primary localized osteoarthrosis of multiple sites 09/04/2019    Priority: Medium    Mild intermittent asthma without complication 96/22/2979    Priority: Medium    Gastroesophageal reflux disease 10/29/2015    Priority: Medium     Chronic, normal EGD. Chronic PPI    Dyspareunia 03/13/2013    Priority: Medium    Basal cell carcinoma of skin 02/29/2012    Priority: Medium     basal cell carcinoma on her left melolabial fold approximately 30 years  ago    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    Priority: Low    By bx by gyn 2022    ACE-inhibitor cough 11/25/2020    Priority: Low   Chronic allergic rhinitis 09/04/2019    Priority: Low   Dry eye syndrome 03/29/2019    Priority: Low   Postmenopausal atrophic vaginitis 03/13/2013    Priority: Low   Thyroid cancer ([place]) 11/13/2021   Health Maintenance  Topic Date Due   Hepatitis C Screening  Never done   MAMMOGRAM  05/12/2022   DEXA SCAN  06/10/2023   COLONOSCOPY (Pts 45-64yrs Insurance coverage will need to be confirmed)  10/26/2026   Pneumonia Vaccine 44+ Years old  Completed   INFLUENZA VACCINE  Completed   COVID-19 Vaccine  Completed   Zoster Vaccines- Shingrix  Completed   HPV VACCINES  Aged Out   TETANUS/TDAP  Discontinued   Immunization History  Administered Date(s) Administered   Fluad Quad(high Dose 65+) 06/19/2019, 06/11/2020, 06/16/2021   Influenza Split 08/30/2012, 08/18/2013, 07/18/2014   Influenza, High Dose Seasonal PF 06/19/2019, 06/11/2020    Influenza, Seasonal, Injecte, Preservative Fre 07/16/2011   Influenza,inj,quad, With Preservative 07/01/2015, 06/30/2016, 06/28/2018   Moderna Covid-19 Vaccine Bivalent Booster 36yrs & up 07/24/2021   Moderna Sars-Covid-2 Vaccination 10/28/2019, 11/30/2019, 08/05/2020, 02/06/2021   Pneumococcal Conjugate-13 10/24/2014   Pneumococcal Polysaccharide-23 10/29/2015   Tdap 01/08/2011   Zoster Recombinat (Shingrix) 12/03/2020, 04/10/2021   We updated and reviewed the patient's past history in detail and it is documented below. Allergies: Patient is allergic to aspirin, nsaids, penicillins, yellow hornet venom [hornet venom], and lisinopril. Past Medical History Patient  has a past medical history of ACE-inhibitor cough (11/25/2020), Allergy, Arthritis, Basal cell carcinoma of skin (02/29/2012), Diverticulitis, Essential hypertension (03/29/2019), GERD (gastroesophageal reflux disease) (03/29/2019), Mild intermittent asthma without complication (89/21/1942), Postmenopausal atrophic vaginitis (03/13/2013), Primary insomnia (11/16/2017), and Seasonal allergic rhinitis due to pollen (03/29/2019). Past Surgical History Patient  has a past surgical history that includes Colonoscopy; Cholecystectomy; Nasal sinus surgery; and Upper gastrointestinal endoscopy. Family History: Patient family history includes Healthy in her son and son; Heart attack in her father; Heart disease in her father; High blood pressure in her brother; Lung cancer in her mother. Social History:  Patient  reports that she has never smoked. She has never used smokeless tobacco. She reports current alcohol use. She reports that she does not use drugs.  Review of Systems: Constitutional: negative for fever or malaise Ophthalmic: negative for photophobia, double vision or loss of vision Cardiovascular: negative for chest pain, dyspnea on exertion, or new LE swelling Respiratory: negative for SOB or persistent cough Gastrointestinal:  negative for abdominal pain, change in bowel habits or melena Genitourinary: negative for dysuria or gross hematuria, no abnormal uterine bleeding or disharge Musculoskeletal: negative for new gait disturbance or muscular weakness Integumentary: negative for new or persistent rashes, no breast lumps Neurological: negative for TIA or stroke symptoms Psychiatric: negative for SI or delusions Allergic/Immunologic: negative for hives  Patient Care Team    Relationship Specialty Notifications Start End  Leamon Arnt, MD PCP - General Family Medicine  03/29/19   Buford Dresser, MD PCP - Cardiology Cardiology  08/06/21   Lenon Oms, MD Consulting Physician Obstetrics and Gynecology  03/29/19   Randye Lobo, Londell Moh, [place] Physician Dermatology  08/07/19   Trula Slade, DPM Consulting Physician Podiatry  08/07/19   Jerene Bears, MD Consulting Physician Gastroenterology  01/23/21     Objective  Vitals: BP 130/86 (BP Location: Left Arm, Patient Position: Sitting, Cuff Size: Normal)    Pulse 70    Temp 97.7 F (36.5 C) (Temporal)    Ht 5\' 3"  (1.6 m)    Wt 138 lb (62.6 kg)    SpO2 98%    BMI 24.45 kg/m  General:  Well developed, well nourished, no acute distress  Psych:  Alert and orientedx3,normal mood and affect HEENT:  Normocephalic, atraumatic, non-icteric sclera,  supple neck without adenopathy, mass or thyromegaly Cardiovascular:  Normal S1, S2, RRR without gallop, rub or murmur Respiratory:  Good breath sounds bilaterally, CTAB with normal respiratory effort Gastrointestinal: normal bowel sounds, soft, non-tender, no noted masses. No HSM  Skin:  Warm, no rashes or suspicious lesions noted Neurologic:    Mental status is normal. CN 2-11 are normal. Gross motor and sensory exams are normal. Normal gait. No tremor   Commons side effects, risks, benefits, and alternatives for medications and treatment plan prescribed today were discussed, and the patient expressed  understanding of the given instructions. Patient is instructed to call or message via MyChart if he/she has any questions or concerns regarding our treatment plan. No barriers to understanding were identified. We discussed Red Flag symptoms and signs in detail. Patient expressed understanding regarding what to do in case of urgent or emergency type symptoms.  Medication list was reconciled, printed and provided to the patient in AVS. Patient instructions and summary information was reviewed with the patient as documented in the AVS. This note was prepared with assistance of Dragon voice recognition software. Occasional wrong-word or sound-a-like substitutions may have occurred due to the inherent limitations of voice recognition software  This visit occurred during the SARS-CoV-2 public health emergency.  Safety protocols were in place, including screening questions prior to the visit, additional usage of staff PPE, and extensive cleaning of exam room while observing appropriate contact time as indicated for disinfecting solutions.

## 2021-11-29 LAB — PT AND PTT

## 2021-11-30 ENCOUNTER — Encounter: Payer: Self-pay | Admitting: Family Medicine

## 2021-12-01 ENCOUNTER — Telehealth: Payer: Self-pay | Admitting: Family Medicine

## 2021-12-01 LAB — HEPATITIS C ANTIBODY
Hepatitis C Ab: NONREACTIVE
SIGNAL TO CUT-OFF: 0.03 (ref <1.00)

## 2021-12-01 NOTE — Telephone Encounter (Signed)
Please schedule pt to come in for labs. ? ?Thanks! ?

## 2021-12-01 NOTE — Telephone Encounter (Signed)
Pt having blood drawn on 3/9 at [facility] per patient - orders will be put I per Melitta.  ?

## 2021-12-01 NOTE — Telephone Encounter (Signed)
[facility] [phone] opt [phone] ? ?Re: sample for test - PTT - sample was not correct. Specimen needs to be collected again. ? ?  ?

## 2021-12-02 ENCOUNTER — Encounter: Payer: Self-pay | Admitting: Family Medicine

## 2021-12-02 NOTE — H&P (Signed)
HPI:  ? ?Tina Kelley is a 71 y.o. female who presents as a new Patient.  ? ?Referring Provider: Self, A Referral ? ?Chief complaint: Thyroid nodule. ? ?HPI: She had a thyroid nodule identified on the right side several years ago while living in [place]. She had ultrasound a couple of times, the last 1 was about 3 years ago. She has never had any symptoms related to it. She has never had a biopsy. ? ?PMH/Meds/All/SocHx/FamHx/ROS:  ? ?Past Medical History:  ?Diagnosis Date  ? Actinic keratosis  ? Asthma  ? Basal cell carcinoma  ? C. difficile diarrhea  ? Depression  ? Hiatal hernia  ? Primary localized osteoarthrosis of multiple sites 09/04/2019  ? Seasonal allergic rhinitis due to pollen 03/29/2019  ? Shingles  ? Thyroid nodule 04/01/2021  ? Unspecified essential hypertension  ? Urinary tract infection  ? Vulvar lesion 05/2019  ? ?Past Surgical History:  ?Procedure Laterality Date  ? CHOLECYSTECTOMY  ? SINUS SURGERY  ? ?No family history of bleeding disorders, wound healing problems or difficulty with anesthesia.  ? ?Social History  ? ?Socioeconomic History  ? Marital status: Married  ?Spouse name: Yvone Neu  ? Number of children: 2  ? Years of education: Not on file  ? Highest education level: Not on file  ?Occupational History  ? Occupation: Retired  ?Tobacco Use  ? Smoking status: Never Smoker  ? Smokeless tobacco: Never Used  ?Vaping Use  ? Vaping Use: Never used  ?Substance and Sexual Activity  ? Alcohol use: Yes  ?Alcohol/week: 25.0 standard drinks  ?Types: 3 Glasses of wine (5 oz./glass) per week  ? Drug use: No  ? Sexual activity: Not Currently  ?Partners: Male  ?Birth control/protection: Post-menopausal  ?Other Topics Concern  ? Not on file  ?Social History Narrative  ? Not on file  ? ?Social Determinants of Health  ? ?Financial Resource Strain: Not on file  ?Food Insecurity: Not on file  ?Transportation Needs: Not on file  ?Physical Activity: Not on file  ?Stress: Not on file  ?Social Connections: Not on file   ?Housing Stability: Not on file  ? ?Current Outpatient Medications:  ? ANUCORT-HC 25 mg suppository, , Disp: , Rfl:  ? buPROPion (WELLBUTRIN SR) 150 MG 12 hr tablet, , Disp: , Rfl:  ? cycloSPORINE (RESTASIS) 0.05 % ophthalmic drops, Apply to eye., Disp: , Rfl:  ? EPINEPHrine (EPIPEN) 0.3 mg/0.3 mL auto-injector, Inject 0.3 mg into the muscle., Disp: , Rfl:  ? fluticasone (FLONASE) 50 mcg/actuation nasal spray, 1 spray by Nasal route as needed. , Disp: , Rfl:  ? lactobacillus rhamnosus, GG, (CULTURELLE) 10 billion cell capsule, Take 1 capsule by mouth daily. , Disp: , Rfl:  ? losartan (COZAAR) 50 MG tablet, , Disp: , Rfl:  ? metoprolol tartrate (LOPRESSOR) 50 MG tablet, Take 25 mg by mouth 2 times daily. , Disp: , Rfl:  ? ranitidine (ZANTAC) 150 MG tablet, Take 150 mg by mouth as needed. , Disp: , Rfl:  ? rosuvastatin (CRESTOR) 10 MG tablet, Take 10 mg by mouth daily. , Disp: , Rfl:  ? VENTOLIN HFA 90 mcg/actuation inhaler, as needed. , Disp: , Rfl:  ? clobetasoL (TEMOVATE) 0.05 % ointment, Apply fingertip amount nightly X 2 weeks, then every other night X 2 weeks, then twice weekly until follow-up visit, Disp: 30 g, Rfl: 0 ? conjugated estrogens (PREMARIN) 0.625 mg/gram vaginal cream, Place vaginally daily for 14 days. Externally than apply twice weekly, Disp: 30 g, Rfl: 2 ? zolpidem (  AMBIEN CR) 12.5 MG CR tablet, Take 12.5 mg by mouth nightly as needed. , Disp: , Rfl:  ? ?A complete ROS was performed with pertinent positives/negatives noted in the HPI. The remainder of the ROS are negative. ? ? ?Physical Exam:  ? ?BP 149/86 (Site: Left arm, Position: Sitting, BP Cuff Size: Large)  Pulse 77  Temp 96.8 ?F (36 ?C) (Temporal)  Resp 18  Ht 1.651 m ('5\' 5"'$ )  Wt 61.2 kg (135 lb)  SpO2 99%  BMI 22.47 kg/m?  ? ?General: Healthy and alert, in no distress, breathing easily. Normal affect. In a pleasant mood. ?Head: Normocephalic, atraumatic. No masses, or scars. ?Eyes: Pupils are equal, and reactive to light. Vision is  grossly intact. No spontaneous or gaze nystagmus. ?Ears: Ear canals are clear. Tympanic membranes are intact, with normal landmarks and the middle ears are clear and healthy. ?Hearing: Grossly normal. ?Nose: Nasal cavities are clear with healthy mucosa, no polyps or exudate. Airways are patent. ?Face: No masses or scars, facial nerve function is symmetric. ?Oral Cavity: No mucosal abnormalities are noted. Tongue with normal mobility. Dentition appears healthy. ?Oropharynx: Tonsils are symmetric. There are no mucosal masses identified. Tongue base appears normal and healthy. ?Larynx/Hypopharynx: deferred ?Chest: Deferred ?Neck: No palpable masses, no cervical adenopathy, isolated small right thyroid nodule identified by palpation. ?Neuro: Cranial nerves II-XII with normal function. ?Balance: Normal gate. ?Other findings: none. ? ?Independent Review of Additional Tests or Records:  ?none ? ?Procedures:  ?none ? ?Impression & Plans:  ?Right side thyroid nodule. Recommend ultrasound to evaluate the characteristics and see if any additional testing will be necessary. ? ?

## 2021-12-03 ENCOUNTER — Other Ambulatory Visit: Payer: Self-pay

## 2021-12-03 DIAGNOSIS — Z01818 Encounter for other preprocedural examination: Secondary | ICD-10-CM

## 2021-12-03 LAB — SPECIMEN STATUS REPORT

## 2021-12-03 LAB — PT AND PTT

## 2021-12-03 NOTE — Pre-Procedure Instructions (Signed)
Surgical Instructions ? ? ? Your procedure is scheduled on Monday 12/08/21. ? ? Report to [facility] Main Entrance "A" at 05:30 A.M., then check in with the Admitting office. ? Call this number if you have problems the morning of surgery: ? [phone] ? ? If you have any questions prior to your surgery date call [phone]: Open Monday-Friday 8am-4pm ? ? ? Remember: ? Do not eat or drink after midnight the night before your surgery ?  ? Take these medicines the morning of surgery with A SIP OF WATER  ? buPROPion [facility] SR)  ? metoprolol succinate (TOPROL-XL) ? pantoprazole (PROTONIX) ? rosuvastatin (CRESTOR)  ? ? Take these medicines if needed:  ? acetaminophen (TYLENOL) ? albuterol (VENTOLIN HFA) 108 (90 Base)  ? EPINEPHrine (EPIPEN)  ? Polyvinyl Alcohol-Povidone (REFRESH OP) ? sodium chloride (OCEAN)  ? ? ?As of today, STOP taking any Aspirin (unless otherwise instructed by your surgeon) Aleve, Naproxen, Ibuprofen, Motrin, Advil, Goody's, BC's, all herbal medications, fish oil, and all vitamins. ?         ?           ?Do NOT Smoke (Tobacco/Vaping) for 24 hours prior to your procedure. ? ?If you use a CPAP at night, you may bring your mask/headgear for your overnight stay. ?  ?Contacts, glasses, piercing's, hearing aid's, dentures or partials may not be worn into surgery, please bring cases for these belongings.  ?  ?For patients admitted to the hospital, discharge time will be determined by your treatment team. ?  ?Patients discharged the day of surgery will not be allowed to drive home, and someone needs to stay with them for 24 hours. ? ?NO VISITORS WILL BE ALLOWED IN PRE-OP WHERE PATIENTS ARE PREPPED FOR SURGERY.  ONLY 1 SUPPORT PERSON MAY BE PRESENT IN THE WAITING ROOM WHILE YOU ARE IN SURGERY.  IF YOU ARE TO BE ADMITTED, ONCE YOU ARE IN YOUR ROOM YOU WILL BE ALLOWED TWO (2) VISITORS. (1) VISITOR MAY STAY OVERNIGHT BUT MUST ARRIVE TO THE ROOM BY 8pm.  Minor children may have two parents present.  Special consideration for safety and communication needs will be reviewed on a case by case basis. ? ? ?Special instructions:   ?[facility]- Preparing For Surgery ? ?Before surgery, you can play an important role. Because skin is not sterile, your skin needs to be as free of germs as possible. You can reduce the number of germs on your skin by washing with CHG (chlorahexidine gluconate) Soap before surgery.  CHG is an antiseptic cleaner which kills germs and bonds with the skin to continue killing germs even after washing.   ? ?Oral Hygiene is also important to reduce your risk of infection.  Remember - BRUSH YOUR TEETH THE MORNING OF SURGERY WITH YOUR REGULAR TOOTHPASTE ? ?Please do not use if you have an allergy to CHG or antibacterial soaps. If your skin becomes reddened/irritated stop using the CHG.  ?Do not shave (including legs and underarms) for at least 48 hours prior to first CHG shower. It is OK to shave your face. ? ?Please follow these instructions carefully. ?  ?Shower the NIGHT BEFORE SURGERY and the MORNING OF SURGERY ? ?If you chose to wash your hair, wash your hair first as usual with your normal shampoo. ? ?After you shampoo, rinse your hair and body thoroughly to remove the shampoo. ? ?Use CHG Soap as you would any other liquid soap. You can apply CHG directly to the skin and wash  gently with a scrungie or a clean washcloth.  ? ?Apply the CHG Soap to your body ONLY FROM THE NECK DOWN.  Do not use on open wounds or open sores. Avoid contact with your eyes, ears, mouth and genitals (private parts). Wash Face and genitals (private parts)  with your normal soap.  ? ?Wash thoroughly, paying special attention to the area where your surgery will be performed. ? ?Thoroughly rinse your body with warm water from the neck down. ? ?DO NOT shower/wash with your normal soap after using and rinsing off the CHG Soap. ? ?Pat yourself dry with a CLEAN TOWEL. ? ?Wear CLEAN PAJAMAS to bed the night before  surgery ? ?Place CLEAN SHEETS on your bed the night before your surgery ? ?DO NOT SLEEP WITH PETS. ? ? ?Day of Surgery: ?Take a shower with CHG soap. ?Do not wear jewelry or makeup ?Do not wear lotions, powders, perfumes/colognes, or deodorant. ?Do not shave 48 hours prior to surgery.  Men may shave face and neck. ?Do not bring valuables to the hospital.  ?[facility] is not responsible for any belongings or valuables. ?Do not wear nail polish, gel polish, artificial nails, or any other type of covering on natural nails (fingers and toes) ?If you have artificial nails or gel coating that need to be removed by a nail salon, please have this removed prior to surgery. Artificial nails or gel coating may interfere with anesthesia's ability to adequately monitor your vital signs. ?Wear Clean/Comfortable clothing the morning of surgery ?Do not apply any deodorants/lotions.   ?Remember to brush your teeth WITH YOUR REGULAR TOOTHPASTE. ?  ?Please read over the following fact sheets that you were given. ? ? ?3 days prior to your procedure or After your COVID test ? ?? You are not required to quarantine however you are required to wear a well-fitting mask when you are out and around people not in your household. If your mask becomes wet or soiled, replace with a new one. ? ?? Wash your hands often with soap and water for 20 seconds or clean your hands with an alcohol-based hand sanitizer that contains at least 60% alcohol. ? ?? Do not share personal items. ? ?? Notify your provider: ? ?o if you are in close contact with someone who has COVID ? ?o or if you develop a fever of 100.4 or greater, sneezing, cough, sore throat, shortness of breath or body aches.  ? ?

## 2021-12-04 ENCOUNTER — Encounter (HOSPITAL_COMMUNITY): Payer: Self-pay

## 2021-12-04 ENCOUNTER — Encounter (HOSPITAL_COMMUNITY)
Admission: RE | Admit: 2021-12-04 | Discharge: 2021-12-04 | Disposition: A | Discharge status: Home / Self Care | Payer: Medicare PPO | Source: Ambulatory Visit | Attending: Otolaryngology | Admitting: Otolaryngology

## 2021-12-04 ENCOUNTER — Other Ambulatory Visit: Payer: Self-pay

## 2021-12-04 VITALS — BP 116/81 | HR 75 | Temp 98.2°F | Resp 17 | Ht 65.0 in | Wt 141.0 lb

## 2021-12-04 DIAGNOSIS — Z01812 Encounter for preprocedural laboratory examination: Secondary | ICD-10-CM | POA: Insufficient documentation

## 2021-12-04 DIAGNOSIS — Z20822 Contact with and (suspected) exposure to covid-19: Secondary | ICD-10-CM | POA: Diagnosis not present

## 2021-12-04 DIAGNOSIS — Z01818 Encounter for other preprocedural examination: Secondary | ICD-10-CM

## 2021-12-04 LAB — SARS CORONAVIRUS 2 (TAT 6-24 HRS): SARS Coronavirus 2: NEGATIVE

## 2021-12-04 NOTE — Progress Notes (Signed)
PCP - Dr. Billey Chang ?Cardiologist - Buford Dresser, MD ? ?PPM/ICD - n/a ?Device Orders - n/a ?Rep Notified - n/a ? ?Chest x-ray -  ?EKG - 08/06/21 ?Stress Test - 12/29/16- [facility]  ?ECHO -  ?Cardiac Cath - denies ? ?Sleep Study - denies ?CPAP - n/a ? ?Fasting Blood Sugar - n/a ?Checks Blood Sugar _____ times a day- n/a ? ?Blood Thinner Instructions: n/a ?Aspirin Instructions: n/a ? ?ERAS Protcol - No. NPO ?PRE-SURGERY Ensure or G2- n/a ? ?COVID TEST- Pending. 12/04/21 ? ? ?Anesthesia review: Yes. ECHO requested from [facility].  ? ?Patient denies shortness of breath, fever, cough and chest pain at PAT appointment ? ? ?All instructions explained to the patient, with a verbal understanding of the material. Patient agrees to go over the instructions while at home for a better understanding. Patient also instructed to self quarantine after being tested for COVID-19. The opportunity to ask questions was provided. ? ? ?

## 2021-12-05 NOTE — Progress Notes (Signed)
Anesthesia Chart Review: ? Case: [id number] Date/Time: 12/08/21 0715  ? Procedure: THYROID LOBECTOMY (Right)  ? Anesthesia type: General  ? Pre-op diagnosis: Thyroid nodule; Thyroid cancer  ? Location: [facility] OR ROOM 09 / [place] [facility]  ? Surgeons: [name], MD  ? ?  ? ? ?DISCUSSION: Patient is a 71 year old female scheduled for the above procedure. 10/29/21 right thyroid nodule FNA: Malignant cells present (Bethesda category VI). ? ?History includes never smoker, HTN, GERD, arthritis, thyroid cancer, skin cancer (BCC). ? ?Medical clearance provided by PCP Dr. Jonni Sanger at 11/28/2021 visit.  Patient felt to be "Average risk for surgery". ? ?She saw cardiologist Dr. Harrell Gave on 08/06/2021 for CV risk assessment given family history of CAD.  She had a normal exercise nuclear stress test in April 2018. She is on metoprolol for palpitations; Crestor for HLD; losartan for HTN (at goal). "The 10-year ASCVD risk score (Arnett DK, et al., 2019) is: 13.3%. She recommended patient follow a heart healthy diet, do moderate walking exercises, and avoid tobacco products and excess alcohol.  1 year follow-up or sooner as needed recommended. ? ?12/04/21 presurgical COVID-19 test negative.  Anesthesia team to evaluate on the day of surgery. She had labs through her PCP on 11/28/21.  ? ? ?VS: BP 116/81   Pulse 75   Temp 36.8 ?C (Oral)   Resp 17   Ht '5\' 5"'$  (1.651 m)   Wt 64 kg   SpO2 99%   BMI 23.46 kg/m?  ? ? ?PROVIDERS: ?Leamon Arnt, MD is PCP ?Buford Dresser, MD is cardiologist ? ? ?LABS: Labs from 11/28/21 include: ?Lab Results  ?Component Value Date  ? WBC 7.2 11/28/2021  ? HGB 13.9 11/28/2021  ? HCT 41.2 11/28/2021  ? PLT 295.0 11/28/2021  ? GLUCOSE 106 (H) 11/28/2021  ? CHOL 204 (H) 11/28/2021  ? TRIG 169.0 (H) 11/28/2021  ? HDL 73.30 11/28/2021  ? LDLDIRECT 82.0 11/25/2020  ? [place] 97 11/28/2021  ? ALT 20 11/28/2021  ? AST 26 11/28/2021  ? NA 139 11/28/2021  ? K 4.5 11/28/2021  ? CL 101 11/28/2021  ? CREATININE 1.02 11/28/2021  ?  BUN 12 11/28/2021  ? CO2 28 11/28/2021  ? TSH 2.22 11/28/2021  ? INR CANCELED 11/28/2021  ? HGBA1C 5.6 11/29/2020  ? ? ? ?IMAGES: ?US Thyroid 04/14/21: ?IMPRESSION: ?Nodule 1 (TI-RADS 4) located in the mid right thyroid lobe meets ?criteria for FNA. ?- The above is in keeping with the ACR TI-RADS recommendations - J Am ?Coll Radiol 5009;38:182-993.  ? ? ?EKG: 08/06/21: NSR ? ? ?CV: ?Exercise Nuclear Stress Test 12/29/16 ([facility]): ?Impression: ?1.  Normal exercise nuclear study with no evidence of inducible ischemia. ?2.  Normal LV size, function and regional wall motion. ? ?PAT RN requested echocardiogram from [facility] as available. Only EKG received. There are 34 pages scanned under Media tab. I did not see any echocardiogram reports and mention of prior echo in office notes provided.  ? ? ?Past Medical History:  ?Diagnosis Date  ? ACE-inhibitor cough 11/25/2020  ? Allergy   ? seasonal  ? Arthritis   ? Basal cell carcinoma of skin 02/29/2012  ? basal cell carcinoma on her left melolabial fold approximately 30 years  ago  ? Diverticulitis   ? Essential hypertension 03/29/2019  ? GERD (gastroesophageal reflux disease) 03/29/2019  ? Chronic, normal EGD. Chronic PPI  ? Mild intermittent asthma without complication 71/69/6789  ? Postmenopausal atrophic vaginitis 03/13/2013  ? Primary insomnia 11/16/2017  ?  Seasonal allergic rhinitis due to pollen 03/29/2019  ? ? ?Past Surgical History:  ?Procedure Laterality Date  ? CHOLECYSTECTOMY    ? COLONOSCOPY    ? x2  ? NASAL SINUS SURGERY    ? UPPER GASTROINTESTINAL ENDOSCOPY    ? ? ?MEDICATIONS: ? acetaminophen (TYLENOL) 500 MG tablet  ? albuterol (VENTOLIN HFA) 108 (90 Base) MCG/ACT inhaler  ? buPROPion (WELLBUTRIN SR) 150 MG 12 hr tablet  ? cholestyramine (QUESTRAN) 4 g packet  ? EPINEPHrine (EPIPEN 2-PAK) 0.3 mg/0.3 mL IJ SOAJ injection  ? hydrocortisone (ANUSOL-HC) 25 MG suppository  ? Lactobacillus Rhamnosus, GG, (CULTURELLE) CAPS  ?  losartan (COZAAR) 50 MG tablet  ? metoprolol succinate (TOPROL-XL) 50 MG 24 hr tablet  ? pantoprazole (PROTONIX) 40 MG tablet  ? Polyvinyl Alcohol-Povidone (REFRESH OP)  ? rosuvastatin (CRESTOR) 10 MG tablet  ? sodium chloride (OCEAN) 0.65 % SOLN nasal spray  ? sucralfate (CARAFATE) 1 GM/10ML suspension  ? zolpidem (AMBIEN) 5 MG tablet  ? ?No current facility-administered medications for this encounter.  ? ? ?Myra Gianotti, PA-C ?Surgical Short Stay/Anesthesiology ?[facility] Phone [phone] ?[facility] Phone [phone] ?12/05/2021 11:05 AM ? ? ? ? ? ? ?

## 2021-12-05 NOTE — Anesthesia Preprocedure Evaluation (Addendum)
Anesthesia Evaluation  ?Patient identified by MRN, date of birth, ID band ?Patient awake ? ? ? ?Reviewed: ?Allergy & Precautions, NPO status , Patient's Chart, lab work & pertinent test results, reviewed documented beta blocker date and time  ? ?Airway ?Mallampati: I ? ?TM Distance: >3 FB ?Neck ROM: Full ? ? ? Dental ?no notable dental hx. ?(+) Teeth Intact, Caps, Dental Advisory Given ?  ?Pulmonary ?asthma ,  ?  ?Pulmonary exam normal ?breath sounds clear to auscultation ? ? ? ? ? ? Cardiovascular ?hypertension, Pt. on medications ?Normal cardiovascular exam ?Rhythm:Regular Rate:Normal ? ?EKG 08/06/21 ?NSR, Normal ? ?Stress Test 12/29/16 ?Normal Exercise Stress test with no inducible ischemia ?  ?Neuro/Psych ?PSYCHIATRIC DISORDERS Anxiety negative neurological ROS ?   ? GI/Hepatic ?Neg liver ROS, GERD  Medicated and Controlled,  ?Endo/Other  ?Thyroid Ca ?Hyperlipidemia ? Renal/GU ?negative Renal ROS  ?negative genitourinary ?  ?Musculoskeletal ? ?(+) Arthritis , Osteoarthritis,   ? Abdominal ?  ?Peds ? Hematology ?negative hematology ROS ?(+)   ?Anesthesia Other Findings ? ? Reproductive/Obstetrics ? ?  ? ? ? ? ? ? ? ? ? ? ? ? ? ?  ?  ? ? ? ? ? ?Anesthesia Physical ?Anesthesia Plan ? ?ASA: 2 ? ?Anesthesia Plan: General  ? ?Post-op Pain Management: Tylenol PO (pre-op)*, Gabapentin PO (pre-op)* and Precedex  ? ?Induction: Intravenous ? ?PONV Risk Score and Plan: 4 or greater and Treatment may vary due to age or medical condition, Ondansetron and Dexamethasone ? ?Airway Management Planned: Oral ETT ? ?Additional Equipment: None ? ?Intra-op Plan:  ? ?Post-operative Plan: Extubation in OR ? ?Informed Consent: I have reviewed the patients History and Physical, chart, labs and discussed the procedure including the risks, benefits and alternatives for the proposed anesthesia with the patient or authorized representative who has indicated his/her understanding and acceptance.  ? ? ? ?Dental  advisory given ? ?Plan Discussed with: CRNA and Anesthesiologist ? ?Anesthesia Plan Comments: (PAT note written 12/05/2021 by Myra Gianotti, PA-C. ?)  ? ? ? ? ?Anesthesia Quick Evaluation ? ?

## 2021-12-08 ENCOUNTER — Observation Stay (HOSPITAL_COMMUNITY)
Admission: RE | Admit: 2021-12-08 | Discharge: 2021-12-09 | Disposition: A | Discharge status: Home / Self Care | Payer: Medicare PPO | Attending: Otolaryngology | Admitting: Otolaryngology

## 2021-12-08 ENCOUNTER — Encounter (HOSPITAL_COMMUNITY): Payer: Self-pay | Admitting: Otolaryngology

## 2021-12-08 ENCOUNTER — Ambulatory Visit (HOSPITAL_BASED_OUTPATIENT_CLINIC_OR_DEPARTMENT_OTHER): Payer: Medicare PPO | Admitting: Anesthesiology

## 2021-12-08 ENCOUNTER — Encounter (HOSPITAL_COMMUNITY)
Admission: RE | Disposition: A | Discharge status: Home / Self Care | Payer: Self-pay | Source: Home / Self Care | Attending: Otolaryngology

## 2021-12-08 ENCOUNTER — Other Ambulatory Visit: Payer: Self-pay

## 2021-12-08 ENCOUNTER — Ambulatory Visit (HOSPITAL_COMMUNITY): Payer: Medicare PPO | Admitting: Vascular Surgery

## 2021-12-08 DIAGNOSIS — E041 Nontoxic single thyroid nodule: Secondary | ICD-10-CM | POA: Diagnosis not present

## 2021-12-08 DIAGNOSIS — J45909 Unspecified asthma, uncomplicated: Secondary | ICD-10-CM | POA: Insufficient documentation

## 2021-12-08 DIAGNOSIS — C73 Malignant neoplasm of thyroid gland: Secondary | ICD-10-CM | POA: Diagnosis present

## 2021-12-08 DIAGNOSIS — K219 Gastro-esophageal reflux disease without esophagitis: Secondary | ICD-10-CM | POA: Diagnosis not present

## 2021-12-08 DIAGNOSIS — E782 Mixed hyperlipidemia: Secondary | ICD-10-CM | POA: Diagnosis not present

## 2021-12-08 DIAGNOSIS — I10 Essential (primary) hypertension: Secondary | ICD-10-CM | POA: Insufficient documentation

## 2021-12-08 DIAGNOSIS — Z79899 Other long term (current) drug therapy: Secondary | ICD-10-CM | POA: Diagnosis not present

## 2021-12-08 DIAGNOSIS — F411 Generalized anxiety disorder: Secondary | ICD-10-CM | POA: Diagnosis not present

## 2021-12-08 DIAGNOSIS — D34 Benign neoplasm of thyroid gland: Principal | ICD-10-CM | POA: Insufficient documentation

## 2021-12-08 SURGERY — THYROIDECTOMY
Anesthesia: General | Laterality: Right

## 2021-12-08 MED ORDER — PANTOPRAZOLE SODIUM 40 MG PO TBEC: 40.0000 mg | DELAYED_RELEASE_TABLET | Freq: Two times a day (BID) | ORAL | Status: DC

## 2021-12-08 MED ORDER — ROCURONIUM BROMIDE 100 MG/10ML IV SOLN
INTRAVENOUS | Status: DC | PRN
  Administered 2021-12-08: 70 mg via INTRAVENOUS

## 2021-12-08 MED ORDER — HYDROCODONE-ACETAMINOPHEN 7.5-325 MG PO TABS: 1.0000 | ORAL_TABLET | Freq: Four times a day (QID) | ORAL | 0 refills | Status: DC | PRN

## 2021-12-08 MED ORDER — PROPOFOL 10 MG/ML IV BOLUS
INTRAVENOUS | Status: AC
  Filled 2021-12-08: qty 20

## 2021-12-08 MED ORDER — LIDOCAINE HCL URETHRAL/MUCOSAL 2 % EX GEL
CUTANEOUS | Status: DC | PRN
  Administered 2021-12-08: 100 via TOPICAL

## 2021-12-08 MED ORDER — LACTATED RINGERS IV SOLN: INTRAVENOUS | Status: DC

## 2021-12-08 MED ORDER — OXYCODONE HCL 5 MG PO TABS
ORAL_TABLET | ORAL | Status: AC
  Filled 2021-12-08: qty 1

## 2021-12-08 MED ORDER — CHOLESTYRAMINE 4 G PO PACK
2.0000 g | PACK | Freq: Two times a day (BID) | ORAL | Status: DC
  Filled 2021-12-08 (×2): qty 1

## 2021-12-08 MED ORDER — BUPROPION HCL ER (SR) 150 MG PO TB12
150.0000 mg | ORAL_TABLET | Freq: Two times a day (BID) | ORAL | Status: DC
  Administered 2021-12-08 – 2021-12-09 (×2): 150 mg via ORAL
  Filled 2021-12-08 (×2): qty 1

## 2021-12-08 MED ORDER — METOPROLOL SUCCINATE ER 25 MG PO TB24: 50.0000 mg | ORAL_TABLET | Freq: Every day | ORAL | Status: DC

## 2021-12-08 MED ORDER — RISAQUAD PO CAPS
1.0000 | ORAL_CAPSULE | Freq: Every day | ORAL | Status: DC
  Administered 2021-12-08: 1 via ORAL
  Filled 2021-12-08: qty 1

## 2021-12-08 MED ORDER — GABAPENTIN 300 MG PO CAPS
ORAL_CAPSULE | ORAL | Status: AC
  Administered 2021-12-08: 300 mg
  Filled 2021-12-08: qty 1

## 2021-12-08 MED ORDER — ROSUVASTATIN CALCIUM 5 MG PO TABS: 10.0000 mg | ORAL_TABLET | Freq: Every day | ORAL | Status: DC

## 2021-12-08 MED ORDER — ZOLPIDEM TARTRATE 5 MG PO TABS
5.0000 mg | ORAL_TABLET | Freq: Every evening | ORAL | Status: DC | PRN
  Administered 2021-12-08: 5 mg via ORAL
  Filled 2021-12-08: qty 1

## 2021-12-08 MED ORDER — ORAL CARE MOUTH RINSE: 15.0000 mL | Freq: Once | OROMUCOSAL | Status: DC

## 2021-12-08 MED ORDER — HYDROCORTISONE ACETATE 25 MG RE SUPP: 25.0000 mg | Freq: Every evening | RECTAL | Status: DC | PRN

## 2021-12-08 MED ORDER — OXYCODONE HCL 5 MG/5ML PO SOLN: 5.0000 mg | Freq: Once | ORAL | Status: AC | PRN

## 2021-12-08 MED ORDER — HYDROMORPHONE HCL 1 MG/ML IJ SOLN: 0.2500 mg | INTRAMUSCULAR | Status: DC | PRN

## 2021-12-08 MED ORDER — ONDANSETRON 4 MG PO TBDP: 4.0000 mg | ORAL_TABLET | Freq: Three times a day (TID) | ORAL | 0 refills | Status: DC | PRN

## 2021-12-08 MED ORDER — ONDANSETRON HCL 4 MG/2ML IJ SOLN
INTRAMUSCULAR | Status: DC | PRN
  Administered 2021-12-08: 4 mg via INTRAVENOUS

## 2021-12-08 MED ORDER — ONDANSETRON HCL 4 MG/2ML IJ SOLN: 4.0000 mg | Freq: Once | INTRAMUSCULAR | Status: DC | PRN

## 2021-12-08 MED ORDER — DEXAMETHASONE SODIUM PHOSPHATE 10 MG/ML IJ SOLN
INTRAMUSCULAR | Status: DC | PRN
  Administered 2021-12-08: 10 mg via INTRAVENOUS

## 2021-12-08 MED ORDER — CHLORHEXIDINE GLUCONATE 0.12 % MT SOLN: 15.0000 mL | Freq: Once | OROMUCOSAL | Status: DC

## 2021-12-08 MED ORDER — ACETAMINOPHEN 500 MG PO TABS
ORAL_TABLET | ORAL | Status: AC
  Administered 2021-12-08: 1000 mg
  Filled 2021-12-08: qty 2

## 2021-12-08 MED ORDER — SUGAMMADEX SODIUM 200 MG/2ML IV SOLN
INTRAVENOUS | Status: DC | PRN
  Administered 2021-12-08: 200 mg via INTRAVENOUS

## 2021-12-08 MED ORDER — EPHEDRINE SULFATE-NACL 50-0.9 MG/10ML-% IV SOSY
PREFILLED_SYRINGE | INTRAVENOUS | Status: DC | PRN
  Administered 2021-12-08: 10 mg via INTRAVENOUS

## 2021-12-08 MED ORDER — ONDANSETRON HCL 4 MG PO TABS: 4.0000 mg | ORAL_TABLET | ORAL | Status: DC | PRN

## 2021-12-08 MED ORDER — POLYVINYL ALCOHOL 1.4 % OP SOLN: 1.0000 [drp] | Freq: Four times a day (QID) | OPHTHALMIC | Status: DC | PRN

## 2021-12-08 MED ORDER — OXYCODONE HCL 5 MG PO TABS
5.0000 mg | ORAL_TABLET | Freq: Once | ORAL | Status: AC | PRN
  Administered 2021-12-08: 5 mg via ORAL

## 2021-12-08 MED ORDER — FENTANYL CITRATE (PF) 100 MCG/2ML IJ SOLN
INTRAMUSCULAR | Status: DC | PRN
Start: 2021-12-08 — End: 2021-12-08
  Administered 2021-12-08 (×2): 50 ug via INTRAVENOUS
  Administered 2021-12-08: 100 ug via INTRAVENOUS

## 2021-12-08 MED ORDER — ONDANSETRON HCL 4 MG/2ML IJ SOLN: 4.0000 mg | INTRAMUSCULAR | Status: DC | PRN

## 2021-12-08 MED ORDER — ACETAMINOPHEN 500 MG PO TABS
500.0000 mg | ORAL_TABLET | Freq: Four times a day (QID) | ORAL | Status: DC | PRN
  Administered 2021-12-08: 500 mg via ORAL
  Filled 2021-12-08: qty 1

## 2021-12-08 MED ORDER — CHLORHEXIDINE GLUCONATE 0.12 % MT SOLN
OROMUCOSAL | Status: AC
  Administered 2021-12-08: 15 mL
  Filled 2021-12-08: qty 15

## 2021-12-08 MED ORDER — SALINE SPRAY 0.65 % NA SOLN: 1.0000 | NASAL | Status: DC | PRN

## 2021-12-08 MED ORDER — ALBUTEROL SULFATE (2.5 MG/3ML) 0.083% IN NEBU: 2.5000 mg | INHALATION_SOLUTION | Freq: Four times a day (QID) | RESPIRATORY_TRACT | Status: DC | PRN

## 2021-12-08 MED ORDER — 0.9 % SODIUM CHLORIDE (POUR BTL) OPTIME
TOPICAL | Status: DC | PRN
  Administered 2021-12-08: 1000 mL

## 2021-12-08 MED ORDER — FENTANYL CITRATE (PF) 250 MCG/5ML IJ SOLN
INTRAMUSCULAR | Status: AC
  Filled 2021-12-08: qty 5

## 2021-12-08 MED ORDER — SUCRALFATE 1 GM/10ML PO SUSP
1.0000 g | Freq: Three times a day (TID) | ORAL | Status: DC
  Administered 2021-12-08 – 2021-12-09 (×2): 1 g via ORAL
  Filled 2021-12-08 (×2): qty 10

## 2021-12-08 MED ORDER — PROPOFOL 10 MG/ML IV BOLUS
INTRAVENOUS | Status: DC | PRN
  Administered 2021-12-08: 150 mg via INTRAVENOUS

## 2021-12-08 MED ORDER — EPINEPHRINE 0.3 MG/0.3ML IJ SOAJ
0.3000 mg | INTRAMUSCULAR | Status: DC | PRN
  Filled 2021-12-08: qty 0.6

## 2021-12-08 MED ORDER — LIDOCAINE-EPINEPHRINE 1 %-1:100000 IJ SOLN
INTRAMUSCULAR | Status: AC
  Filled 2021-12-08: qty 1

## 2021-12-08 MED ORDER — HYDROCODONE-ACETAMINOPHEN 5-325 MG PO TABS: 1.0000 | ORAL_TABLET | ORAL | Status: DC | PRN

## 2021-12-08 MED ORDER — LOSARTAN POTASSIUM 50 MG PO TABS
50.0000 mg | ORAL_TABLET | Freq: Every day | ORAL | Status: DC
  Administered 2021-12-08: 50 mg via ORAL
  Filled 2021-12-08: qty 1

## 2021-12-08 MED ORDER — DEXTROSE-NACL 5-0.9 % IV SOLN: INTRAVENOUS | Status: DC

## 2021-12-08 SURGICAL SUPPLY — 45 items
ADH SKN CLS APL DERMABOND .7 (GAUZE/BANDAGES/DRESSINGS) ×1
BAG COUNTER SPONGE SURGICOUNT (BAG) ×2 IMPLANT
BAG SPNG CNTER NS LX DISP (BAG) ×1
BLADE SURG 15 STRL LF DISP TIS (BLADE) IMPLANT
BLADE SURG 15 STRL SS (BLADE)
CANISTER SUCT 3000ML PPV (MISCELLANEOUS) ×2 IMPLANT
CLEANER TIP ELECTROSURG 2X2 (MISCELLANEOUS) ×2 IMPLANT
CNTNR URN SCR LID CUP LEK RST (MISCELLANEOUS) ×1 IMPLANT
CONT SPEC 4OZ STRL OR WHT (MISCELLANEOUS) ×2
CORD BIPOLAR FORCEPS 12FT (ELECTRODE) ×2 IMPLANT
COVER SURGICAL LIGHT HANDLE (MISCELLANEOUS) ×2 IMPLANT
DERMABOND ADVANCED (GAUZE/BANDAGES/DRESSINGS) ×1
DERMABOND ADVANCED .7 DNX12 (GAUZE/BANDAGES/DRESSINGS) ×1 IMPLANT
DRAIN HEMOVAC 7FR (DRAIN) IMPLANT
DRAIN JACKSON RD 7FR 3/32 (WOUND CARE) ×1 IMPLANT
DRAIN SNY 10 ROU (WOUND CARE) IMPLANT
DRAPE HALF SHEET 40X57 (DRAPES) IMPLANT
ELECT COATED BLADE 2.86 ST (ELECTRODE) ×2 IMPLANT
ELECT REM PT RETURN 9FT ADLT (ELECTROSURGICAL) ×2
ELECTRODE REM PT RTRN 9FT ADLT (ELECTROSURGICAL) ×1 IMPLANT
EVACUATOR SILICONE 100CC (DRAIN) ×3 IMPLANT
FORCEPS BIPOLAR SPETZLER 8 1.0 (NEUROSURGERY SUPPLIES) ×2 IMPLANT
GAUZE 4X4 16PLY ~~LOC~~+RFID DBL (SPONGE) IMPLANT
GLOVE SURG ENC MOIS LTX SZ6.5 (GLOVE) IMPLANT
GLOVE SURG LTX SZ7.5 (GLOVE) ×2 IMPLANT
GOWN STRL REUS W/ TWL LRG LVL3 (GOWN DISPOSABLE) ×2 IMPLANT
GOWN STRL REUS W/TWL LRG LVL3 (GOWN DISPOSABLE) ×4
KIT BASIN OR (CUSTOM PROCEDURE TRAY) ×2 IMPLANT
KIT TURNOVER KIT B (KITS) ×2 IMPLANT
NDL PRECISIONGLIDE 27X1.5 (NEEDLE) ×1 IMPLANT
NEEDLE PRECISIONGLIDE 27X1.5 (NEEDLE) ×2 IMPLANT
NS IRRIG 1000ML POUR BTL (IV SOLUTION) ×2 IMPLANT
PAD ARMBOARD 7.5X6 YLW CONV (MISCELLANEOUS) ×4 IMPLANT
PENCIL FOOT CONTROL (ELECTRODE) ×2 IMPLANT
SHEARS HARMONIC 9CM CVD (BLADE) ×2 IMPLANT
STAPLER VISISTAT 35W (STAPLE) ×2 IMPLANT
SUT CHROMIC 3 0 PS 2 (SUTURE) ×2 IMPLANT
SUT CHROMIC 4 0 P 3 18 (SUTURE) ×2 IMPLANT
SUT CHROMIC 4 0 PS 2 18 (SUTURE) IMPLANT
SUT ETHILON 3 0 PS 1 (SUTURE) ×2 IMPLANT
SUT SILK 3 0 REEL (SUTURE) ×2 IMPLANT
SUT SILK 4 0 PS 2 (SUTURE) ×1 IMPLANT
SUT SILK 4 0 REEL (SUTURE) ×2 IMPLANT
TOWEL GREEN STERILE FF (TOWEL DISPOSABLE) ×2 IMPLANT
TRAY ENT MC OR (CUSTOM PROCEDURE TRAY) ×2 IMPLANT

## 2021-12-08 NOTE — Anesthesia Postprocedure Evaluation (Signed)
Anesthesia Post Note ? ?Patient: Tina Kelley ? ?Procedure(s) Performed: THYROID LOBECTOMY (Right) ? ?  ? ?Patient location during evaluation: PACU ?Anesthesia Type: General ?Level of consciousness: awake and alert and oriented ?Pain management: pain level controlled ?Vital Signs Assessment: post-procedure vital signs reviewed and stable ?Respiratory status: spontaneous breathing, nonlabored ventilation, respiratory function stable and patient connected to nasal cannula oxygen ?Cardiovascular status: blood pressure returned to baseline and stable ?Postop Assessment: no apparent nausea or vomiting ?Anesthetic complications: no ? ? ?No notable events documented. ? ?Last Vitals:  ?Vitals:  ? 12/08/21 0925 12/08/21 0955  ?BP: (!) 155/89 (!) 156/88  ?Pulse: 70 71  ?Resp: (!) 9 14  ?Temp:    ?SpO2: 96% 97%  ?  ?Last Pain:  ?Vitals:  ? 12/08/21 0955  ?TempSrc:   ?PainSc: Asleep  ? ? ?  ?  ?  ?  ?  ?  ? ?Jah Alarid A. ? ? ? ? ?

## 2021-12-08 NOTE — Transfer of Care (Signed)
Immediate Anesthesia Transfer of Care Note ? ?Patient: Tina Kelley ? ?Procedure(s) Performed: THYROID LOBECTOMY (Right) ? ?Patient Location: PACU ? ?Anesthesia Type:General ? ?Level of Consciousness: awake, alert  and oriented ? ?Airway & Oxygen Therapy: Patient Spontanous Breathing ? ?Post-op Assessment: Report given to RN, Post -op Vital signs reviewed and stable and Patient moving all extremities X 4 ? ?Post vital signs: Reviewed and stable ? ?Last Vitals:  ?Vitals Value Taken Time  ?BP 149/99 12/08/21 0846  ?Temp 37.4 ?C 12/08/21 0846  ?Pulse 70 12/08/21 0846  ?Resp 9 12/08/21 0846  ?SpO2 95 % 12/08/21 0846  ?Vitals shown include unvalidated device data. ? ?Last Pain:  ?Vitals:  ? 12/08/21 0846  ?TempSrc: Tympanic  ?PainSc:   ?   ? ?Patients Stated Pain Goal: 3 (12/08/21 0840) ? ?Complications: No notable events documented. ?

## 2021-12-08 NOTE — Anesthesia Procedure Notes (Signed)
Procedure Name: Intubation ?Date/Time: 12/08/2021 7:45 AM ?Performed by: Sharee Pimple, CRNA ?Pre-anesthesia Checklist: Patient identified, Emergency Drugs available, Suction available, Patient being monitored and Timeout performed ?Patient Re-evaluated:Patient Re-evaluated prior to induction ?Oxygen Delivery Method: Circle system utilized ?Preoxygenation: Pre-oxygenation with 100% oxygen ?Induction Type: IV induction ?Ventilation: Mask ventilation without difficulty ?Laryngoscope Size: Mac and 3 ?Grade View: Grade I ?Tube type: Oral ?Tube size: 7.0 mm ?Airway Equipment and Method: Stylet ?Placement Confirmation: ETT inserted through vocal cords under direct vision, positive ETCO2, CO2 detector and breath sounds checked- equal and bilateral ?Secured at: 22 cm ?Tube secured with: Tape ?Dental Injury: Teeth and Oropharynx as per pre-operative assessment  ? ? ? ? ?

## 2021-12-08 NOTE — Op Note (Signed)
OPERATIVE REPORT ? ?DATE OF SURGERY: 12/08/2021 ? ?PATIENT:  Tina Kelley,  71 y.o. female ? ?PRE-OPERATIVE DIAGNOSIS:  Thyroid nodule; Thyroid cancer ? ?POST-OPERATIVE DIAGNOSIS:  Thyroid nodule; Thyroid cancer ? ?PROCEDURE:  Procedure(s): ?THYROID LOBECTOMY, right ? ?SURGEON:  Beckie Salts, MD ? ?ASSISTANTS: RNFA ? ?ANESTHESIA:   General  ? ?EBL: 40 ml ? ?DRAINS: 7 [place] round JP ? ?LOCAL MEDICATIONS USED:  None ? ?SPECIMEN: Right thyroid lobectomy ? ?COUNTS:  Correct ? ?PROCEDURE DETAILS: ?The patient was taken to the operating room and placed on the operating table in the supine position. A shoulder roll was placed beneath the shoulder blades and the neck was extended. The neck was prepped and draped in a standard fashion. A low collar transverse incision was outlined with a marking pen and was incised with electrocautery. Dissection was continued down through the platysma layer.  Self-retaining retractors were used throughout the case. ? ?The midline fascia was divided.  The strap muscles were off of the right thyroid lobe. ? ? ?Directed medially.  The superior vasculature was separately identified and ligated between clamps using either the harmonic dissector was as needed.  The putative superior parathyroid was identified and preserved with blood supply.  The recurrent nerve was identified entering into the larynx and was preserved.  The middle thyroid vein was ligated between clamps and divided.  The inferior vasculature was separately identified and ligated as well ?Fashion.  An inferior parathyroid was identified and preserved.  The gland was brought forward as ligament was divided.  Thyroid isthmus was dissected off of the trachea: The isthmus was divided using the harmonic dissector.  The nodule was felt within the right lobe and was surrounded by adequate normal thyroid tissue.  The lobe was sent for pathologic evaluation.  The wound was irrigated with saline. ? ?The drain was secured in place with a  silk suture. The midline fascia was reapproximated with interrupted chromic suture. The platysma layer was also reapproximated with interrupted chromic suture. A running subcuticular closure was accomplished. Dermabond was used on the skin. The drain was charged. The patient was awakened, extubated and transferred to recovery in stable condition. ? ? ?PATIENT DISPOSITION:  To PACU, stable ? ? ? ? ? ? ? ? ? ? ? ? ? ?

## 2021-12-08 NOTE — Interval H&P Note (Signed)
History and Physical Interval Note: ? ?12/08/2021 ?7:19 AM ? ?Tina Kelley  has presented today for surgery, with the diagnosis of Thyroid nodule; Thyroid cancer.  The various methods of treatment have been discussed with the patient and family. After consideration of risks, benefits and other options for treatment, the patient has consented to  Procedure(s): ?THYROID LOBECTOMY (Right) as a surgical intervention.  The patient's history has been reviewed, patient examined, no change in status, stable for surgery.  I have reviewed the patient's chart and labs.  Questions were answered to the patient's satisfaction.   ? ? ?Tina Kelley ? ? ?

## 2021-12-08 NOTE — Discharge Instructions (Signed)
You may shower and use soap and water. Do not use any creams, oils or ointment. ° °

## 2021-12-08 NOTE — Plan of Care (Signed)
  Problem: Education: Goal: Knowledge of General Education information will improve Description Including pain rating scale, medication(s)/side effects and non-pharmacologic comfort measures Outcome: Progressing   Problem: Health Behavior/Discharge Planning: Goal: Ability to manage health-related needs will improve Outcome: Progressing   

## 2021-12-09 ENCOUNTER — Encounter (HOSPITAL_COMMUNITY): Payer: Self-pay | Admitting: Otolaryngology

## 2021-12-09 DIAGNOSIS — D34 Benign neoplasm of thyroid gland: Secondary | ICD-10-CM | POA: Diagnosis not present

## 2021-12-09 DIAGNOSIS — Z79899 Other long term (current) drug therapy: Secondary | ICD-10-CM | POA: Diagnosis not present

## 2021-12-09 DIAGNOSIS — I10 Essential (primary) hypertension: Secondary | ICD-10-CM | POA: Diagnosis not present

## 2021-12-09 DIAGNOSIS — J45909 Unspecified asthma, uncomplicated: Secondary | ICD-10-CM | POA: Diagnosis not present

## 2021-12-09 LAB — SURGICAL PATHOLOGY

## 2021-12-09 NOTE — Progress Notes (Signed)
Patient discharged to home with instructions. 

## 2021-12-09 NOTE — Discharge Summary (Signed)
?Physician Discharge Summary  ?Patient ID: ?Tina Kelley ?MRN: [id number] ?DOB/AGE: [birth year] 72 y.o. ? ?Admit date: 12/08/2021 ?Discharge date: 12/09/2021 ? ?Admission Diagnoses: Thyroid carcinoma ? ?Discharge Diagnoses:  ?Principal Problem: ?  Thyroid ca [facility]) ? ? ?Discharged Condition: good ? ?Hospital Course: No complications ? ?Consults: none ? ?Significant Diagnostic Studies: none ? ?Treatments: surgery: Thyroid lobectomy ? ?Discharge Exam: ?Blood pressure (!) 143/82, pulse 75, temperature 98 ?F (36.7 ?C), temperature source Oral, resp. rate 17, height '5\' 5"'$  (1.651 m), weight 62.6 kg, SpO2 95 %. ?PHYSICAL EXAM: ?Voice normal.  Incision excellent. ? ?Disposition: Discharge disposition: 01-Home or Self Care ? ? ? ? ? ? ?Discharge Instructions   ? ? Diet - low sodium heart healthy   Complete by: As directed ?  ? Increase activity slowly   Complete by: As directed ?  ? No wound care   Complete by: As directed ?  ? ?  ? ?Allergies as of 12/09/2021   ? ?   Reactions  ? Aspirin Other (See Comments)  ? Asthma  ? Nsaids Shortness Of Breath  ? Penicillins Diarrhea  ? Told as a child  ? 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 Lisinopril Cough  ? ?  ? ?  ?Medication List  ?  ? ?TAKE these medications   ? ?acetaminophen 500 MG tablet ?Commonly known as: TYLENOL ?Take 500 mg by mouth every 6 (six) hours as needed for moderate pain. ?  ?albuterol 108 (90 Base) MCG/ACT inhaler ?Commonly known as: VENTOLIN HFA ?INHALE 2 PUFFS INTO THE LUNGS EVERY 6 HOURS AS NEEDED FOR WHEEZING OR SHORTNESS OF BREATH ?  ?buPROPion 150 MG 12 hr tablet ?Commonly known as: WELLBUTRIN SR ?TAKE ONE TABLET BY MOUTH TWICE A DAY ?  ?cholestyramine 4 g packet ?Commonly known as: QUESTRAN ?Take 0.5 packets (2 g total) by mouth 2 (two) times daily as needed. ?  ?Culturelle Caps ?Take 1 capsule by mouth daily. ?  ?EPINEPHrine 0.3 mg/0.3 mL Soaj injection ?Commonly known as: EpiPen 2-Pak ?Inject 0.3 mg into the muscle as needed for anaphylaxis. Use as  instructed. ?  ?HYDROcodone-acetaminophen 7.5-325 MG tablet ?Commonly known as: Norco ?Take 1 tablet by mouth every 6 (six) hours as needed for moderate pain. ?  ?hydrocortisone 25 MG suppository ?Commonly known as: ANUSOL-HC ?Place 1 suppository (25 mg total) rectally at bedtime as needed for hemorrhoids. ?  ?losartan 50 MG tablet ?Commonly known as: COZAAR ?TAKE ONE TABLET BY MOUTH DAILY ?  ?metoprolol succinate 50 MG 24 hr tablet ?Commonly known as: TOPROL-XL ?Take 1 tablet (50 mg total) by mouth daily. Take with or immediately following a meal. ?  ?ondansetron 4 MG disintegrating tablet ?Commonly known as: ZOFRAN-ODT ?Take 1 tablet (4 mg total) by mouth every 8 (eight) hours as needed for nausea or vomiting. ?  ?pantoprazole 40 MG tablet ?Commonly known as: PROTONIX ?TAKE 1 TABLET BY MOUTH TWO TIMES A DAY ?  ?REFRESH OP ?Place 1 drop into both eyes daily as needed (dry eyes). ?  ?rosuvastatin 10 MG tablet ?Commonly known as: CRESTOR ?Take 1 tablet (10 mg total) by mouth daily. ?  ?sodium chloride 0.65 % Soln nasal spray ?Commonly known as: OCEAN ?Place 1 spray into both nostrils as needed for congestion. ?  ?sucralfate 1 GM/10ML suspension ?Commonly known as: CARAFATE ?TAKE TEN MILLILITERS BY MOUTH FOUR TIMES A DAY WITH MEALS AND AT BEDTIME ?  ?zolpidem 5 MG tablet ?Commonly known as: AMBIEN ?TAKE ONE TABLET BY MOUTH EVERY NIGHT AT  BEDTIME AS NEEDED FOR SLEEP ?  ? ?  ? ? ? ?Signed: ?[name] ?12/09/2021, 8:46 AM ? ? ?

## 2021-12-09 NOTE — Addendum Note (Signed)
Addendum  created 12/09/21 1015 by Josephine Igo, CRNA  ? Charge Capture section accepted  ?  ?

## 2021-12-09 NOTE — Progress Notes (Signed)
Patient ID: Tina Kelley, female   DOB: 09/06/1951, 71 y.o.   MRN: [id number] ?Subjective: ?Doing great, no complaints. ? ?Objective: ?Vital signs in last 24 hours: ?Temp:  [97.3 ?F (36.3 ?C)-99.3 ?F (37.4 ?C)] 98 ?F (36.7 ?C) (03/14 0745) ?Pulse Rate:  [62-78] 75 (03/14 0745) ?Resp:  [8-22] 17 (03/14 0745) ?BP: (122-168)/(75-99) 143/82 (03/14 0745) ?SpO2:  [94 %-100 %] 95 % (03/14 0745) ?Weight change:  ?Last BM Date : 12/08/21 ? ?Intake/Output from previous day: ?03/13 0701 - 03/14 0700 ?In: 2776 [P.O.:720; I.V.:2056] ?Out: 16 [Drains:16] ?Intake/Output this shift: ?No intake/output data recorded. ? ?PHYSICAL EXAM: ?Incision looks excellent.  Drain removed.  Voice is normal. ? ?Lab Results: ?No results for input(s): WBC, HGB, HCT, PLT in the last 72 hours. ?BMET ?No results for input(s): NA, K, CL, CO2, GLUCOSE, BUN, CREATININE, CALCIUM in the last 72 hours. ? ?Studies/Results: ?No results found. ? ?Medications: I have reviewed the patient's current medications. ? ?Assessment/Plan: ?Stable postop day 1.  Discharge home today. ? LOS: 0 days  ? ? ? ? ?Tina Kelley ?12/09/2021, 8:44 AM ? ? ?

## 2021-12-25 DIAGNOSIS — H1013 Acute atopic conjunctivitis, bilateral: Secondary | ICD-10-CM | POA: Diagnosis not present

## 2022-02-01 ENCOUNTER — Other Ambulatory Visit: Payer: Self-pay | Admitting: Internal Medicine

## 2022-02-25 IMAGING — US US FNA BIOPSY THYROID 1ST LESION
1 series · 13 of 15 positions shown · non-contrast
Comparison: Prior US, mos recent 04/14/21

MEDICATIONS:
None

COMPLICATIONS:
None immediate.

INDICATION: Indeterminate thyroid nodule

EXAM:
ULTRASOUND GUIDED FINE NEEDLE ASPIRATION OF INDETERMINATE THYROID
NODULE
TECHNIQUE: Informed written consent was obtained from the patient after a
discussion of the risks, benefits and alternatives to treatment.
Questions regarding the procedure were encouraged and answered. A
timeout was performed prior to the initiation of the procedure.

[Series 1: us fna biopsy thyroid 1st lesion · 0.04mm/px · 15 acquisitions, 13 frames shown]
[im 1/15]
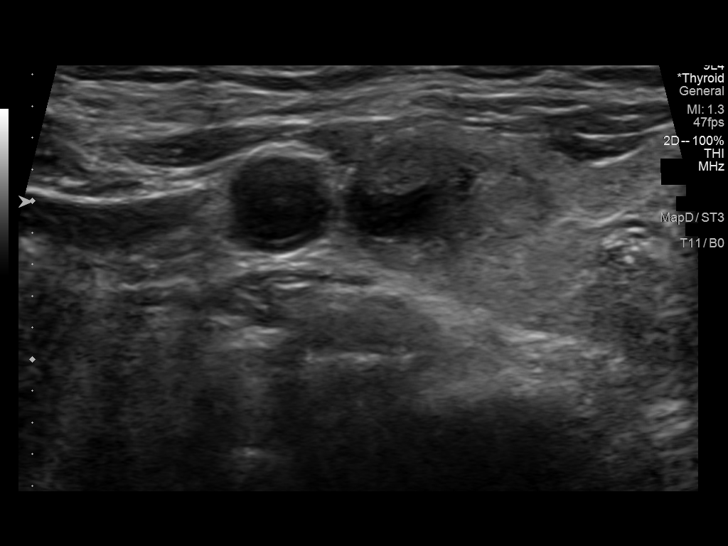
[im 2/15]
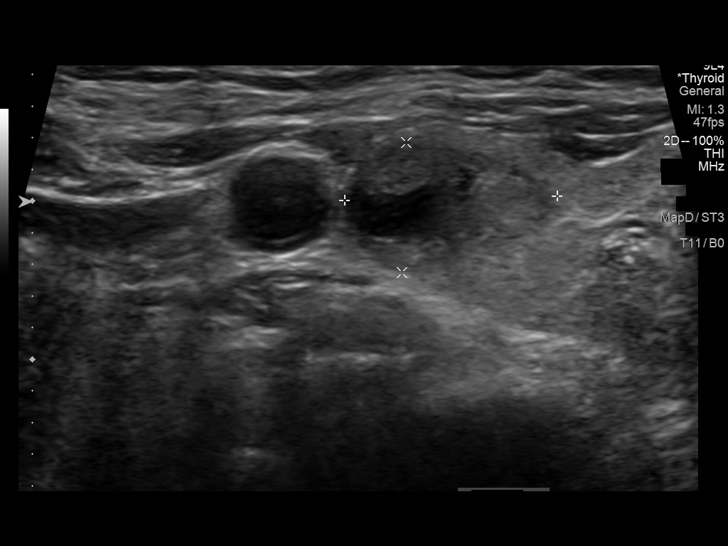
[im 3/15]
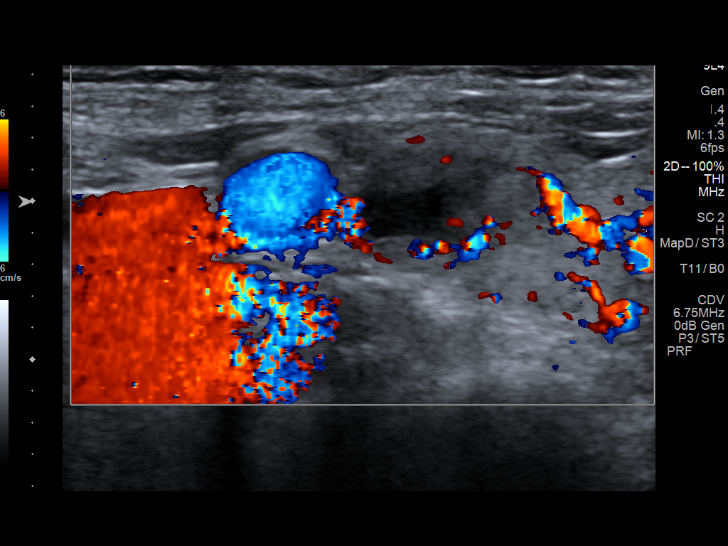
[im 5/15]
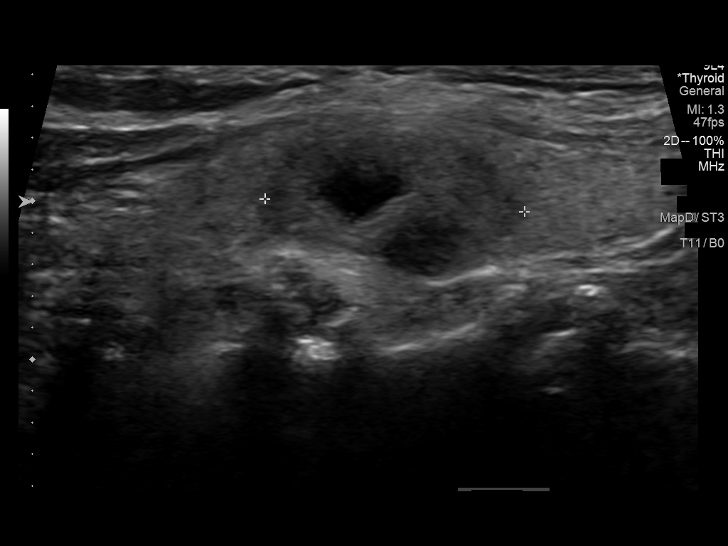
[im 6/15]
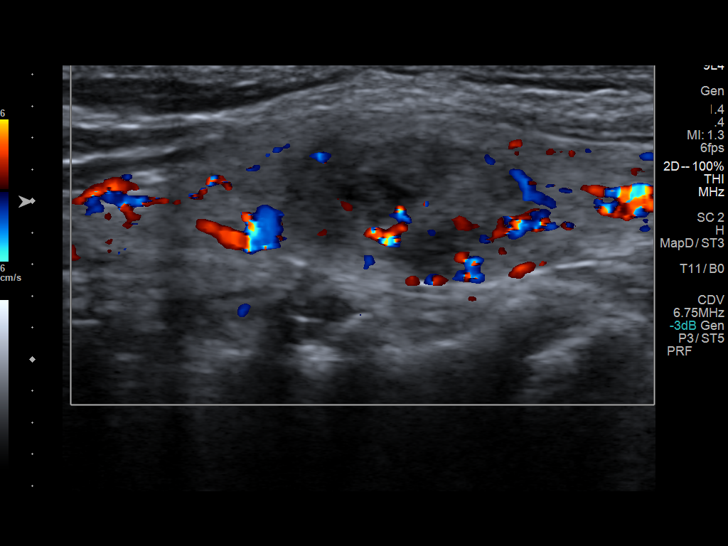
[im 7/15]
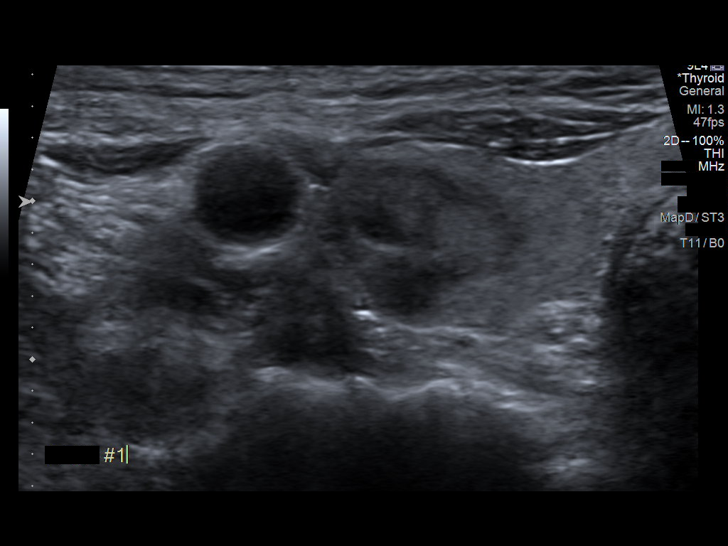
[im 8/15]
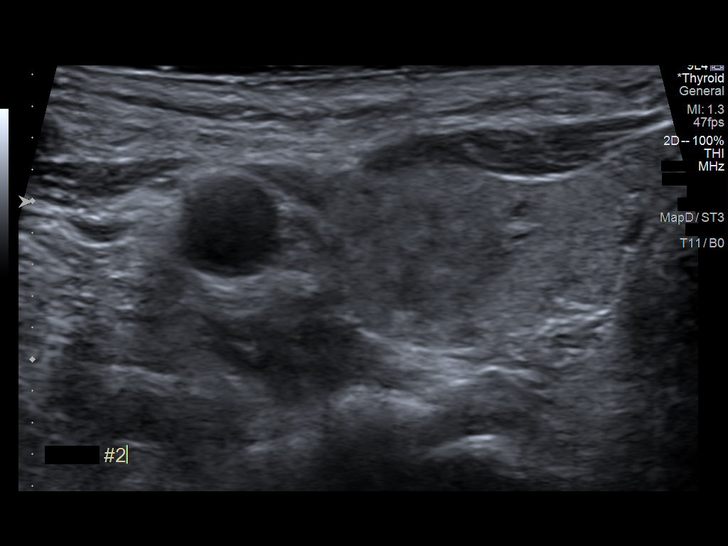
[im 9/15]
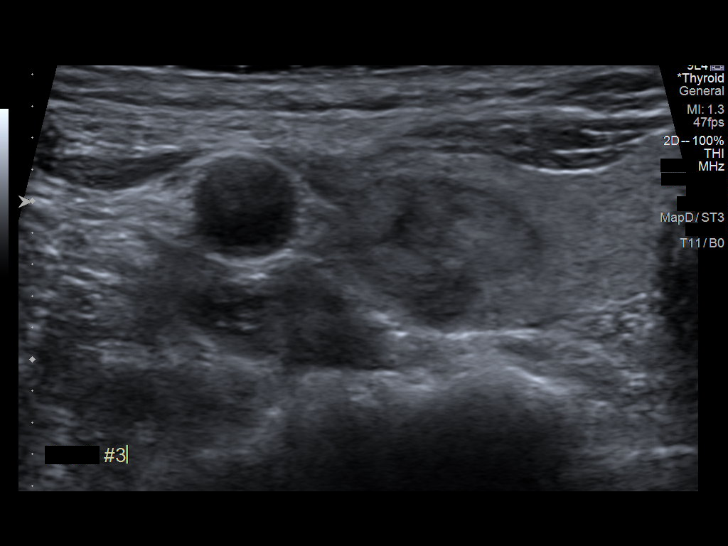
[im 10/15]
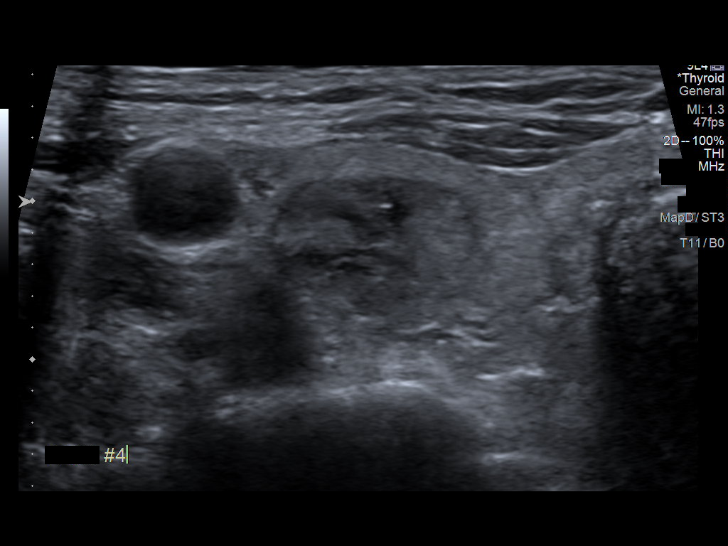
[im 11/15]
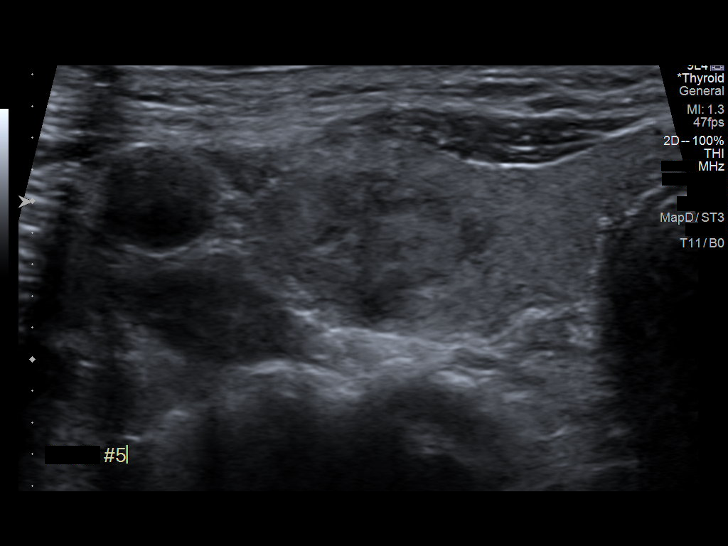
[im 13/15]
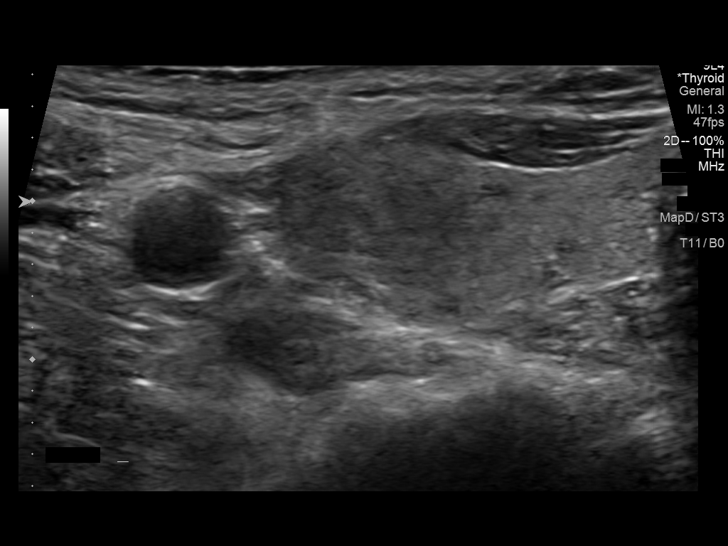
[im 14/15]
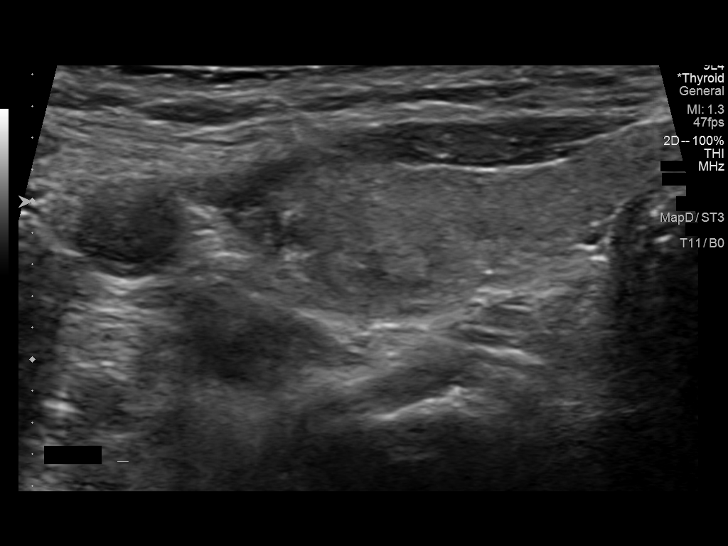
[im 15/15]
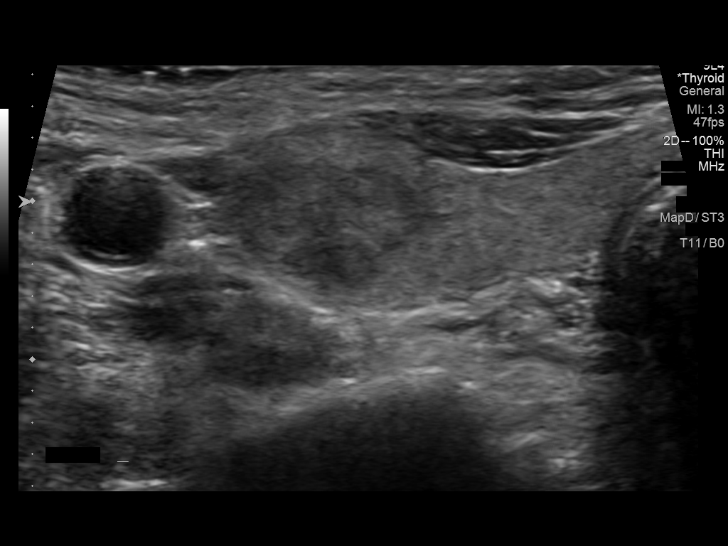

[13 of 15 positions shown; findings below may reference images not displayed]

Pre-procedural ultrasound scanning demonstrated unchanged size and
appearance of the indeterminate nodule within the right middle lobe

The procedure was planned. The neck was prepped in the usual sterile
fashion, and a sterile drape was applied covering the operative
field. A timeout was performed prior to the initiation of the
procedure. Local anesthesia was provided with 1% lidocaine.

Under direct ultrasound guidance, 5 FNA biopsies were performed of
the nodule with a 27 gauge needle. Multiple ultrasound images were
saved for procedural documentation purposes. The samples were
prepared and submitted to pathology. Two of these specimens were
reserved for Afirma testing.

Limited post procedural scanning was negative for hematoma or
additional complication. Dressings were placed. The patient
tolerated the above procedures procedure well without immediate
postprocedural complication.
FINDINGS: Nodule reference number based on prior diagnostic ultrasound: 1

Maximum size: 1.6 cm

Location: Right; Mid

ACR TI-RADS risk category: TR4 (4-6 points)

Reason for biopsy: meets ACR TI-RADS criteria

Ultrasound imaging confirms appropriate placement of the needles
within the thyroid nodule.
IMPRESSION: Technically successful ultrasound guided fine needle aspiration of
right middle lobe thyroid nodule

Performed and read by Billy, [place]

## 2022-03-18 ENCOUNTER — Other Ambulatory Visit: Payer: Self-pay | Admitting: Family Medicine

## 2022-03-26 DIAGNOSIS — H16223 Keratoconjunctivitis sicca, not specified as Sjogren's, bilateral: Secondary | ICD-10-CM | POA: Diagnosis not present

## 2022-03-26 DIAGNOSIS — H2513 Age-related nuclear cataract, bilateral: Secondary | ICD-10-CM | POA: Diagnosis not present

## 2022-03-30 ENCOUNTER — Encounter: Payer: Self-pay | Admitting: Family Medicine

## 2022-03-30 ENCOUNTER — Ambulatory Visit: Payer: Medicare PPO | Admitting: Family Medicine

## 2022-03-30 VITALS — BP 132/70 | HR 72 | Temp 98.0°F | Ht 65.0 in | Wt 135.6 lb

## 2022-03-30 DIAGNOSIS — F411 Generalized anxiety disorder: Secondary | ICD-10-CM

## 2022-03-30 DIAGNOSIS — C73 Malignant neoplasm of thyroid gland: Secondary | ICD-10-CM | POA: Diagnosis not present

## 2022-03-30 DIAGNOSIS — I10 Essential (primary) hypertension: Secondary | ICD-10-CM

## 2022-03-30 DIAGNOSIS — F4321 Adjustment disorder with depressed mood: Secondary | ICD-10-CM

## 2022-03-30 DIAGNOSIS — E890 Postprocedural hypothyroidism: Secondary | ICD-10-CM | POA: Diagnosis not present

## 2022-03-30 DIAGNOSIS — D34 Benign neoplasm of thyroid gland: Secondary | ICD-10-CM | POA: Diagnosis not present

## 2022-03-30 DIAGNOSIS — F5101 Primary insomnia: Secondary | ICD-10-CM

## 2022-03-30 LAB — COMPREHENSIVE METABOLIC PANEL
ALT: 27 U/L (ref 0–35)
AST: 32 U/L (ref 0–37)
Albumin: 4.5 g/dL (ref 3.5–5.2)
Alkaline Phosphatase: 70 U/L (ref 39–117)
BUN: 14 mg/dL (ref 6–23)
CO2: 22 mEq/L (ref 19–32)
Calcium: 9.9 mg/dL (ref 8.4–10.5)
Chloride: 101 mEq/L (ref 96–112)
Creatinine, Ser: 1.05 mg/dL (ref 0.40–1.20)
GFR: 53.1 mL/min — ABNORMAL LOW (ref >60.00)
Glucose, Bld: 115 mg/dL — ABNORMAL HIGH (ref 70–99)
Potassium: 4.5 mEq/L (ref 3.5–5.1)
Sodium: 138 mEq/L (ref 135–145)
Total Bilirubin: 0.7 mg/dL (ref 0.2–1.2)
Total Protein: 7.2 g/dL (ref 6.0–8.3)

## 2022-03-30 LAB — CBC WITH DIFFERENTIAL/PLATELET
Basophils Absolute: 0 10*3/uL (ref 0.0–0.1)
Basophils Relative: 0.6 % (ref 0.0–3.0)
Eosinophils Absolute: 0.1 10*3/uL (ref 0.0–0.7)
Eosinophils Relative: 1.6 % (ref 0.0–5.0)
HCT: 44.7 % (ref 36.0–46.0)
Hemoglobin: 14.8 g/dL (ref 12.0–15.0)
Lymphocytes Relative: 19.8 % (ref 12.0–46.0)
Lymphs Abs: 1.6 10*3/uL (ref 0.7–4.0)
MCHC: 33.2 g/dL (ref 30.0–36.0)
MCV: 97.7 fl (ref 78.0–100.0)
Monocytes Absolute: 0.6 10*3/uL (ref 0.1–1.0)
Monocytes Relative: 7.7 % (ref 3.0–12.0)
Neutro Abs: 5.6 10*3/uL (ref 1.4–7.7)
Neutrophils Relative %: 70.3 % (ref 43.0–77.0)
Platelets: 312 10*3/uL (ref 150.0–400.0)
RBC: 4.57 Mil/uL (ref 3.87–5.11)
RDW: 13.9 % (ref 11.5–15.5)
WBC: 8 10*3/uL (ref 4.0–10.5)

## 2022-03-30 LAB — TSH: TSH: 3.23 u[IU]/mL (ref 0.35–5.50)

## 2022-03-30 MED ORDER — ALPRAZOLAM 0.5 MG PO TABS: 0.5000 mg | ORAL_TABLET | Freq: Every day | ORAL | 0 refills | Status: DC | PRN

## 2022-03-30 MED ORDER — ZOLPIDEM TARTRATE 10 MG PO TABS: ORAL_TABLET | ORAL | 5 refills | Status: DC

## 2022-03-30 NOTE — Patient Instructions (Signed)
Please follow up as scheduled for your next visit with me: 06/02/2022   If you have any questions or concerns, please don't hesitate to send me a message via MyChart or call the office at [phone]. Thank you for visiting with [place] today! It's our pleasure caring for you.

## 2022-03-30 NOTE — Progress Notes (Signed)
Subjective  CC:  Chief Complaint  Patient presents with   low Blood pressure    Pt stated that she has been experiencing some high/low Bp that comes and goes. Reading 91/60    HPI: Tina Kelley is a 71 y.o. female who presents to the office today to address the problems listed above in the chief complaint. Hypertension f/u: Patient on Toprol-XL 50 daily and losartan 50 daily.  Blood pressure has been well controlled.  However, 3 to 4 days ago she had some low readings.  She admits she was feeling a little weaker than normal.  Since then, her blood pressure has normalized at home.  Today it is elevated in the office.  Unfortunately, her husband passed away few weeks ago.  She is grieving appropriately but per usual is not sleeping well, not eating or drinking well.  She does take her blood pressure medications.  She denies chest pain, palpitations, significant lightheadedness, lower extremity edema or blood loss. Grief: Appropriate.  Good support system.  Requesting Xanax for daytime anxiety.  She does carry diagnosis of generalized anxiety disorder and is on Wellbutrin. S/p partial thyroidectomy for presumed thyroid cancer in March.  Fortunately, thyroid pathology was negative for carcinoma and instead showed follicular adenoma.  Assessment  1. Essential hypertension   2. Thyroid cancer ([place])   3. Follicular adenoma of thyroid gland   4. Status post partial thyroidectomy   5. Grief reaction   6. Primary insomnia   7. GAD (generalized anxiety disorder)      Plan   Hypertension f/u: BP control is variable .  Suspect low blood pressure readings were due to poor nutritional intake and poor sleep.  Her weight is down.  Today elevated in the office likely due to stress.  We will check blood work.  Continue blood pressure medications and she will continue to monitor at home.  If blood pressures remain elevated she will let me know.  No medication changes today.  Recommend better hydration and  nutrition if possible. Recheck thyroid after partial thyroidectomy.  Fortunately negative pathology for cancer Grief reaction: Counseling done.  Xanax to be used intermittently if needed. Primary insomnia: Increase Ambien to 10 mg nightly.  Educated on not using Xanax  Education regarding management of these chronic disease states was given. Management strategies discussed on successive visits include dietary and exercise recommendations, goals of achieving and maintaining IBW, and lifestyle modifications aiming for adequate sleep and minimizing stressors.   Follow up: As scheduled for follow-up in September  Orders Placed This Encounter  Procedures   Comprehensive metabolic panel   CBC with Differential/Platelet   TSH   Meds ordered this encounter  Medications   zolpidem (AMBIEN) 10 MG tablet    Sig: TAKE ONE TABLET BY MOUTH EVERY NIGHT AT BEDTIME AS NEEDED FOR SLEEP    Dispense:  30 tablet    Refill:  5   ALPRAZolam (XANAX) 0.5 MG tablet    Sig: Take 1 tablet (0.5 mg total) by mouth daily as needed for anxiety.    Dispense:  30 tablet    Refill:  0      BP Readings from Last 3 Encounters:  03/30/22 132/70  12/09/21 (!) 143/82  12/04/21 116/81   Wt Readings from Last 3 Encounters:  03/30/22 135 lb 9.6 oz (61.5 kg)  12/08/21 138 lb (62.6 kg)  12/04/21 141 lb (64 kg)    Lab Results  Component Value Date   CHOL 204 (H) 11/28/2021  CHOL 181 11/25/2020   CHOL 198 11/24/2019   Lab Results  Component Value Date   HDL 73.30 11/28/2021   HDL 64.90 11/25/2020   HDL 68.00 11/24/2019   Lab Results  Component Value Date   LDLCALC 97 11/28/2021   LDLCALC 92 11/24/2019   LDLCALC 98 11/22/2018   Lab Results  Component Value Date   TRIG 169.0 (H) 11/28/2021   TRIG 250.0 (H) 11/25/2020   TRIG 190.0 (H) 11/24/2019   Lab Results  Component Value Date   CHOLHDL 3 11/28/2021   CHOLHDL 3 11/25/2020   CHOLHDL 3 11/24/2019   Lab Results  Component Value Date    LDLDIRECT 82.0 11/25/2020   Lab Results  Component Value Date   CREATININE 1.02 11/28/2021   BUN 12 11/28/2021   NA 139 11/28/2021   K 4.5 11/28/2021   CL 101 11/28/2021   CO2 28 11/28/2021    The 10-year ASCVD risk score (Arnett DK, et al., 2019) is: 16%   Values used to calculate the score:     Age: 74 years     Sex: Female     Is Non-Hispanic African American: No     Diabetic: No     Tobacco smoker: No     Systolic Blood Pressure: 154 mmHg     Is BP treated: Yes     HDL Cholesterol: 73.3 mg/dL     Total Cholesterol: 204 mg/dL  I reviewed the patients updated PMH, FH, and SocHx.    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    Priority: Medium    Primary localized osteoarthrosis of multiple sites 09/04/2019    Priority: Medium    Mild intermittent asthma without complication 00/86/7619    Priority: Medium    Gastroesophageal reflux disease 10/29/2015    Priority: Medium    Dyspareunia 03/13/2013    Priority: Medium    Basal cell carcinoma of skin 02/29/2012    Priority: Medium    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    Priority: Low   ACE-inhibitor cough 11/25/2020    Priority: Low   Chronic allergic rhinitis 09/04/2019    Priority: Low   Dry eye syndrome 03/29/2019    Priority: Low   Postmenopausal atrophic vaginitis 03/13/2013    Priority: Low   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    Allergies: Aspirin, Nsaids, Penicillins, Yellow hornet venom [hornet venom], and Lisinopril  Social History: Patient  reports that she has never smoked. She has never used smokeless tobacco. She reports current  alcohol use. She reports that she does not use drugs.  Current Meds  Medication Sig   acetaminophen (TYLENOL) 500 MG tablet Take 500 mg by mouth every 6 (six) hours as needed for moderate pain.   albuterol (VENTOLIN HFA) 108 (90 Base) MCG/ACT inhaler INHALE 2 PUFFS INTO THE LUNGS EVERY 6 HOURS AS NEEDED FOR WHEEZING OR SHORTNESS OF BREATH   buPROPion (WELLBUTRIN SR) 150 MG 12 hr tablet TAKE ONE TABLET BY MOUTH TWICE A DAY   cholestyramine (QUESTRAN) 4 g packet Take 0.5 packets (2 g total) by mouth 2 (two)  times daily as needed.   EPINEPHrine (EPIPEN 2-PAK) 0.3 mg/0.3 mL IJ SOAJ injection Inject 0.3 mg into the muscle as needed for anaphylaxis. Use as instructed.   hydrocortisone (ANUSOL-HC) 25 MG suppository Place 1 suppository (25 mg total) rectally at bedtime as needed for hemorrhoids.   Lactobacillus Rhamnosus, GG, (CULTURELLE) CAPS Take 1 capsule by mouth daily.   losartan (COZAAR) 50 MG tablet TAKE ONE TABLET BY MOUTH DAILY   metoprolol succinate (TOPROL-XL) 50 MG 24 hr tablet Take 1 tablet (50 mg total) by mouth daily. Take with or immediately following a meal.   pantoprazole (PROTONIX) 40 MG tablet TAKE ONE TABLET BY MOUTH TWICE A DAY   Polyvinyl Alcohol-Povidone (REFRESH OP) Place 1 drop into both eyes daily as needed (dry eyes).   rosuvastatin (CRESTOR) 10 MG tablet Take 1 tablet (10 mg total) by mouth daily.   sodium chloride (OCEAN) 0.65 % SOLN nasal spray Place 1 spray into both nostrils as needed for congestion.   sucralfate (CARAFATE) 1 GM/10ML suspension TAKE TEN MILLILITERS BY MOUTH FOUR TIMES A DAY WITH MEALS AND AT BEDTIME   [DISCONTINUED] HYDROcodone-acetaminophen (NORCO) 7.5-325 MG tablet Take 1 tablet by mouth every 6 (six) hours as needed for moderate pain.   [DISCONTINUED] zolpidem (AMBIEN) 5 MG tablet TAKE ONE TABLET BY MOUTH EVERY NIGHT AT BEDTIME AS NEEDED FOR SLEEP    Review of Systems: Cardiovascular: negative for chest pain, palpitations, leg swelling,  orthopnea Respiratory: negative for SOB, wheezing or persistent cough Gastrointestinal: negative for abdominal pain Genitourinary: negative for dysuria or gross hematuria  Objective  Vitals: BP 132/70 Comment: By consistent home readings  Pulse 72   Temp 98 F (36.7 C)   Ht '5\' 5"'$  (1.651 m)   Wt 135 lb 9.6 oz (61.5 kg)   SpO2 98%   BMI 22.57 kg/m  General: no acute distress  Psych:  Alert and oriented, normal mood and affect HEENT:  Normocephalic, atraumatic, supple neck  Cardiovascular:  RRR without murmur. no edema Respiratory:  Good breath sounds bilaterally, CTAB with normal respiratory effort Skin:  Warm, no rashes Neurologic:   Mental status is normal Commons side effects, risks, benefits, and alternatives for medications and treatment plan prescribed today were discussed, and the patient expressed understanding of the given instructions. Patient is instructed to call or message via MyChart if he/she has any questions or concerns regarding our treatment plan. No barriers to understanding were identified. We discussed Red Flag symptoms and signs in detail. Patient expressed understanding regarding what to do in case of urgent or emergency type symptoms.  Medication list was reconciled, printed and provided to the patient in AVS. Patient instructions and summary information was reviewed with the patient as documented in the AVS. This note was prepared with assistance of Dragon voice recognition software. Occasional wrong-word or sound-a-like substitutions may have occurred due to the inherent limitations of voice recognition software  This visit occurred during the SARS-CoV-2 public health emergency.  Safety protocols were in place, including screening questions prior to the visit, additional usage of staff PPE, and extensive cleaning of exam room while observing appropriate contact time as indicated for disinfecting solutions.

## 2022-04-06 DIAGNOSIS — Z6823 Body mass index (BMI) 23.0-23.9, adult: Secondary | ICD-10-CM | POA: Diagnosis not present

## 2022-04-06 DIAGNOSIS — Z01419 Encounter for gynecological examination (general) (routine) without abnormal findings: Secondary | ICD-10-CM | POA: Diagnosis not present

## 2022-04-21 DIAGNOSIS — L814 Other melanin hyperpigmentation: Secondary | ICD-10-CM | POA: Diagnosis not present

## 2022-04-21 DIAGNOSIS — Z9889 Other specified postprocedural states: Secondary | ICD-10-CM | POA: Diagnosis not present

## 2022-04-21 DIAGNOSIS — L249 Irritant contact dermatitis, unspecified cause: Secondary | ICD-10-CM | POA: Diagnosis not present

## 2022-04-21 DIAGNOSIS — L821 Other seborrheic keratosis: Secondary | ICD-10-CM | POA: Diagnosis not present

## 2022-04-21 DIAGNOSIS — D1801 Hemangioma of skin and subcutaneous tissue: Secondary | ICD-10-CM | POA: Diagnosis not present

## 2022-04-21 DIAGNOSIS — D229 Melanocytic nevi, unspecified: Secondary | ICD-10-CM | POA: Diagnosis not present

## 2022-04-21 DIAGNOSIS — L570 Actinic keratosis: Secondary | ICD-10-CM | POA: Diagnosis not present

## 2022-04-28 ENCOUNTER — Other Ambulatory Visit: Payer: Self-pay | Admitting: Family Medicine

## 2022-04-29 ENCOUNTER — Ambulatory Visit: Payer: Medicare PPO | Admitting: Family Medicine

## 2022-05-06 ENCOUNTER — Ambulatory Visit: Payer: Medicare PPO | Admitting: Family Medicine

## 2022-05-06 ENCOUNTER — Encounter: Payer: Self-pay | Admitting: Family Medicine

## 2022-05-06 VITALS — BP 120/60 | HR 81 | Temp 97.8°F | Ht 65.0 in | Wt 137.8 lb

## 2022-05-06 DIAGNOSIS — F4321 Adjustment disorder with depressed mood: Secondary | ICD-10-CM | POA: Diagnosis not present

## 2022-05-06 DIAGNOSIS — L570 Actinic keratosis: Secondary | ICD-10-CM

## 2022-05-06 MED ORDER — MUPIROCIN 2 % EX OINT: TOPICAL_OINTMENT | Freq: Two times a day (BID) | CUTANEOUS | 0 refills | Status: DC

## 2022-05-06 NOTE — Progress Notes (Signed)
Subjective  CC:  Chief Complaint  Patient presents with   Blister    Pt stated that she had some areas on her Rt let the the dermatologist froze off and now she has some dry spots that looks like they are not healing .   Depression    HPI: Tina Kelley is a 71 y.o. female who presents to the office today to address the problems listed above in the chief complaint. Reviewed derm notes: had cryotherapy done 7/25 for XBL:[id number], right arm, bilateral legs: had large reaction: large blistering in each spot. Now with persistent surrounding redness and scabbing that "won't heal". No drainage or pain. No systemic sxs. Wants to be sure not infected.  Grief: stable.    Assessment  1. Actinic keratosis   2. Grief reaction      Plan  AK s/p cryotherapy:  inflammatory reaction. Wound care discussed. Recommend bandages. Use mupirocin on left lower leg lesion due to crusting. Give more time. Can notify derm.  Grief counseling done.   Follow up: as scheduled  06/02/2022  No orders of the defined types were placed in this encounter.  Meds ordered this encounter  Medications   mupirocin ointment (BACTROBAN) 2 %    Sig: Apply topically 2 (two) times daily.    Dispense:  15 g    Refill:  0      I reviewed the patients updated PMH, FH, and SocHx.    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    Priority: Medium    Primary localized osteoarthrosis of multiple sites 09/04/2019    Priority: Medium    Mild intermittent asthma without complication 06/20/3006    Priority: Medium    Gastroesophageal reflux disease 10/29/2015    Priority: Medium    Dyspareunia  03/13/2013    Priority: Medium    Basal cell carcinoma of skin 02/29/2012    Priority: Medium    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    Priority: Low   ACE-inhibitor cough 11/25/2020    Priority: Low   Chronic allergic rhinitis 09/04/2019    Priority: Low   Dry eye syndrome 03/29/2019    Priority: Low   Postmenopausal atrophic vaginitis 03/13/2013    Priority: Low   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   albuterol (VENTOLIN HFA) 108 (90 Base) MCG/ACT inhaler INHALE 2 PUFFS INTO THE LUNGS EVERY 6 HOURS AS NEEDED FOR WHEEZING OR SHORTNESS OF BREATH   ALPRAZolam (XANAX) 0.5 MG tablet Take 1 tablet (0.5 mg total) by mouth daily as needed for anxiety.   buPROPion (WELLBUTRIN SR) 150 MG 12 hr tablet TAKE ONE TABLET BY MOUTH TWICE A DAY   cholestyramine (QUESTRAN) 4 g packet Take 0.5 packets (2 g total) by mouth 2 (two) times daily as needed.   EPINEPHrine (EPIPEN 2-PAK) 0.3 mg/0.3 mL IJ SOAJ injection Inject 0.3 mg into the muscle as needed for anaphylaxis. Use as instructed.   hydrocortisone (ANUSOL-HC)  25 MG suppository Place 1 suppository (25 mg total) rectally at bedtime as needed for hemorrhoids.   Lactobacillus Rhamnosus, GG, (CULTURELLE) CAPS Take 1 capsule by mouth daily.   losartan (COZAAR) 50 MG tablet TAKE ONE TABLET BY MOUTH DAILY   metoprolol succinate (TOPROL-XL) 50 MG 24 hr tablet Take 1 tablet (50 mg total) by mouth daily. Take with or immediately following a meal.   mupirocin ointment (BACTROBAN) 2 % Apply topically 2 (two) times daily.   pantoprazole (PROTONIX) 40 MG tablet TAKE ONE TABLET BY MOUTH TWICE A DAY   Polyvinyl Alcohol-Povidone (REFRESH OP) Place 1 drop into both eyes daily as needed (dry eyes).   rosuvastatin (CRESTOR) 10 MG tablet Take 1 tablet (10 mg total) by mouth daily.   sodium chloride  (OCEAN) 0.65 % SOLN nasal spray Place 1 spray into both nostrils as needed for congestion.   sucralfate (CARAFATE) 1 GM/10ML suspension TAKE TEN MILLILITERS BY MOUTH FOUR TIMES A DAY WITH MEALS AND AT BEDTIME   zolpidem (AMBIEN) 10 MG tablet TAKE ONE TABLET BY MOUTH EVERY NIGHT AT BEDTIME AS NEEDED FOR SLEEP    Allergies: Patient is allergic to aspirin, nsaids, penicillins, yellow hornet venom [hornet venom], and lisinopril. Family History: Patient family history includes Healthy in her son and son; Heart attack in her father; Heart disease in her father; High blood pressure in her brother; Lung cancer in her mother. Social History:  Patient  reports that she has never smoked. She has never used smokeless tobacco. She reports current alcohol use. She reports that she does not use drugs.  Review of Systems: Constitutional: Negative for fever malaise or anorexia Cardiovascular: negative for chest pain Respiratory: negative for SOB or persistent cough Gastrointestinal: negative for abdominal pain  Objective  Vitals: BP 120/60   Pulse 81   Temp 97.8 F (36.6 C)   Ht '5\' 5"'$  (1.651 m)   Wt 137 lb 12.8 oz (62.5 kg)   SpO2 96%   BMI 22.93 kg/m  General: no acute distress , A&Ox3 Skin:  Warm, no rashes Chest, right upper arm, right lower leg and left lower leg: scabbed lesions w/ surrounding erythema. No calor. Non tender. No fluctance    Commons side effects, risks, benefits, and alternatives for medications and treatment plan prescribed today were discussed, and the patient expressed understanding of the given instructions. Patient is instructed to call or message via MyChart if he/she has any questions or concerns regarding our treatment plan. No barriers to understanding were identified. We discussed Red Flag symptoms and signs in detail. Patient expressed understanding regarding what to do in case of urgent or emergency type symptoms.  Medication list was reconciled, printed and  provided to the patient in AVS. Patient instructions and summary information was reviewed with the patient as documented in the AVS. This note was prepared with assistance of Dragon voice recognition software. Occasional wrong-word or sound-a-like substitutions may have occurred due to the inherent limitations of voice recognition software  This visit occurred during the SARS-CoV-2 public health emergency.  Safety protocols were in place, including screening questions prior to the visit, additional usage of staff PPE, and extensive cleaning of exam room while observing appropriate contact time as indicated for disinfecting solutions.

## 2022-05-08 ENCOUNTER — Other Ambulatory Visit: Payer: Self-pay | Admitting: Internal Medicine

## 2022-05-18 ENCOUNTER — Encounter: Payer: Self-pay | Admitting: Family Medicine

## 2022-05-18 DIAGNOSIS — Z1231 Encounter for screening mammogram for malignant neoplasm of breast: Secondary | ICD-10-CM | POA: Diagnosis not present

## 2022-06-02 ENCOUNTER — Other Ambulatory Visit: Payer: Self-pay | Admitting: Family Medicine

## 2022-06-02 ENCOUNTER — Encounter: Payer: Self-pay | Admitting: Family Medicine

## 2022-06-02 ENCOUNTER — Ambulatory Visit: Payer: Medicare PPO | Admitting: Family Medicine

## 2022-06-02 VITALS — BP 110/70 | HR 70 | Temp 97.7°F | Ht 65.0 in | Wt 137.8 lb

## 2022-06-02 DIAGNOSIS — Z8249 Family history of ischemic heart disease and other diseases of the circulatory system: Secondary | ICD-10-CM

## 2022-06-02 DIAGNOSIS — I10 Essential (primary) hypertension: Secondary | ICD-10-CM | POA: Diagnosis not present

## 2022-06-02 NOTE — Progress Notes (Signed)
Subjective  CC:  Chief Complaint  Patient presents with   Hypertension    HPI: Tina Kelley is a 71 y.o. female who presents to the office today to address the problems listed above in the chief complaint. Hypertension f/u: control is mostly good. She monitors regularly at home but will have once/weekly symptomatic lows: 100/50 etc. On toprol xl 50 and lostartan 50 (she takes 1/2 q am and 1/2 q pm). No palpitations. No cp. However, bp is elevated in office today. At times, has had white coat response.  Assessment  1. Essential hypertension   2. Family history of premature CAD      Plan   Hypertension f/u: BP control is fairly well controlled. Elevated in office today but may be white coat response. Will bring in cuff with her to next OV to compare with our readings. But sounds accurate given her symptomatic lows. Will continue current meds but have low threshold to decrease dose of arb or stop it. Has f/u with cards in November so will see what bp is running then. Pt agrees.  Will return for HD flu vaccine.   Education regarding management of these chronic disease states was given. Management strategies discussed on successive visits include dietary and exercise recommendations, goals of achieving and maintaining IBW, and lifestyle modifications aiming for adequate sleep and minimizing stressors.   Follow up: 6 mo for cpe  No orders of the defined types were placed in this encounter.  No orders of the defined types were placed in this encounter.     BP Readings from Last 3 Encounters:  06/02/22 (!) 148/90  05/06/22 120/60  03/30/22 132/70   Wt Readings from Last 3 Encounters:  06/02/22 137 lb 12.8 oz (62.5 kg)  05/06/22 137 lb 12.8 oz (62.5 kg)  03/30/22 135 lb 9.6 oz (61.5 kg)    Lab Results  Component Value Date   CHOL 204 (H) 11/28/2021   CHOL 181 11/25/2020   CHOL 198 11/24/2019   Lab Results  Component Value Date   HDL 73.30 11/28/2021   HDL 64.90 11/25/2020    HDL 68.00 11/24/2019   Lab Results  Component Value Date   LDLCALC 97 11/28/2021   LDLCALC 92 11/24/2019   LDLCALC 98 11/22/2018   Lab Results  Component Value Date   TRIG 169.0 (H) 11/28/2021   TRIG 250.0 (H) 11/25/2020   TRIG 190.0 (H) 11/24/2019   Lab Results  Component Value Date   CHOLHDL 3 11/28/2021   CHOLHDL 3 11/25/2020   CHOLHDL 3 11/24/2019   Lab Results  Component Value Date   LDLDIRECT 82.0 11/25/2020   Lab Results  Component Value Date   CREATININE 1.05 03/30/2022   BUN 14 03/30/2022   NA 138 03/30/2022   K 4.5 03/30/2022   CL 101 03/30/2022   CO2 22 03/30/2022    The 10-year ASCVD risk score (Arnett DK, et al., 2019) is: 19.7%   Values used to calculate the score:     Age: 54 years     Sex: Female     Is Non-Hispanic African American: No     Diabetic: No     Tobacco smoker: No     Systolic Blood Pressure: 875 mmHg     Is BP treated: Yes     HDL Cholesterol: 73.3 mg/dL     Total Cholesterol: 204 mg/dL  I reviewed the patients updated PMH, FH, and SocHx.    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    Priority: Medium    Primary localized osteoarthrosis of multiple sites 09/04/2019    Priority: Medium    Mild intermittent asthma without complication 47/65/4650    Priority: Medium    Gastroesophageal reflux disease 10/29/2015    Priority: Medium    Dyspareunia 03/13/2013    Priority: Medium    Basal cell carcinoma of skin 02/29/2012    Priority: Medium    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 She has never used smokeless tobacco. She reports current alcohol use. She reports that she does not use drugs.  Current Meds  Medication Sig   acetaminophen (TYLENOL) 500 MG tablet Take 500 mg by mouth every 6 (six) hours as needed for moderate pain.   albuterol (VENTOLIN HFA) 108 (90 Base) MCG/ACT inhaler INHALE 2 PUFFS INTO THE LUNGS EVERY 6 HOURS AS NEEDED FOR WHEEZING OR SHORTNESS OF BREATH   ALPRAZolam (XANAX) 0.5 MG tablet Take 1 tablet (0.5 mg total) by mouth daily as needed for anxiety.   buPROPion (WELLBUTRIN SR) 150 MG 12 hr tablet TAKE ONE TABLET BY MOUTH TWICE A DAY   cholestyramine (QUESTRAN) 4 g packet Take 0.5 packets (2 g total) by mouth 2 (two) times daily as needed.   EPINEPHrine (EPIPEN 2-PAK) 0.3 mg/0.3 mL IJ SOAJ injection Inject 0.3 mg into the muscle as needed for anaphylaxis. Use as instructed.   hydrocortisone (ANUSOL-HC) 25 MG suppository Place 1 suppository (25 mg total) rectally at bedtime as needed for hemorrhoids.   Lactobacillus Rhamnosus, GG, (CULTURELLE) CAPS Take 1 capsule by mouth daily.   losartan (COZAAR) 50 MG tablet TAKE ONE TABLET BY MOUTH DAILY   metoprolol succinate (TOPROL-XL) 50 MG 24 hr tablet Take 1 tablet (50 mg total) by mouth daily. Take with or immediately following a meal.   mupirocin ointment (BACTROBAN) 2 % Apply topically 2 (two) times daily.   pantoprazole (PROTONIX) 40 MG tablet TAKE ONE TABLET BY MOUTH TWICE A DAY   Polyvinyl Alcohol-Povidone (REFRESH OP) Place 1 drop into both eyes daily as needed (dry eyes).   rosuvastatin (CRESTOR) 10 MG tablet Take 1 tablet (10 mg total) by mouth  daily.   sodium chloride (OCEAN) 0.65 % SOLN nasal spray Place 1 spray into both nostrils as needed for congestion.   sucralfate (CARAFATE) 1 GM/10ML suspension TAKE TEN MILLILITERS BY MOUTH FOUR TIMES A DAY WITH MEALS AND AT BEDTIME   zolpidem (AMBIEN) 10 MG tablet TAKE ONE TABLET BY MOUTH EVERY NIGHT AT BEDTIME AS NEEDED FOR SLEEP    Review of Systems: Cardiovascular: negative for chest pain, palpitations, leg swelling, orthopnea Respiratory: negative for SOB, wheezing or persistent cough  Gastrointestinal: negative for abdominal pain Genitourinary: negative for dysuria or gross hematuria  Objective  Vitals: BP (!) 148/90   Pulse 70   Temp 97.7 F (36.5 C)   Ht '5\' 5"'$  (1.651 m)   Wt 137 lb 12.8 oz (62.5 kg)   SpO2 98%   BMI 22.93 kg/m  General: no acute distress  Psych:  Alert and oriented, normal mood and affect HEENT:  Normocephalic, atraumatic, supple neck  Cardiovascular:  RRR without murmur. no edema Respiratory:  Good breath sounds bilaterally, CTAB with normal respiratory effort Neurologic:   Mental status is normal Commons side effects, risks, benefits, and alternatives for medications and treatment plan prescribed today were discussed, and the patient expressed understanding of the given instructions. Patient is instructed to call or message via MyChart if he/she has any questions or concerns regarding our treatment plan. No barriers to understanding were identified. We discussed Red Flag symptoms and signs in detail. Patient expressed understanding regarding what to do in case of urgent or emergency type symptoms.  Medication list was reconciled, printed and provided to the patient in AVS. Patient instructions and summary information was reviewed with the patient as documented in the AVS. This note was prepared with assistance of Dragon voice recognition software. Occasional wrong-word or sound-a-like substitutions may have occurred due to the inherent limitations of voice  recognition software  This visit occurred during the SARS-CoV-2 public health emergency.  Safety protocols were in place, including screening questions prior to the visit, additional usage of staff PPE, and extensive cleaning of exam room while observing appropriate contact time as indicated for disinfecting solutions.

## 2022-06-08 ENCOUNTER — Ambulatory Visit (HOSPITAL_BASED_OUTPATIENT_CLINIC_OR_DEPARTMENT_OTHER): Payer: Medicare PPO | Admitting: Family

## 2022-06-08 ENCOUNTER — Encounter (HOSPITAL_BASED_OUTPATIENT_CLINIC_OR_DEPARTMENT_OTHER): Payer: Self-pay | Admitting: Family

## 2022-06-08 VITALS — BP 135/73 | HR 69 | Ht 65.0 in | Wt 138.2 lb

## 2022-06-08 DIAGNOSIS — R002 Palpitations: Secondary | ICD-10-CM

## 2022-06-08 DIAGNOSIS — I10 Essential (primary) hypertension: Secondary | ICD-10-CM

## 2022-06-08 DIAGNOSIS — F4321 Adjustment disorder with depressed mood: Secondary | ICD-10-CM | POA: Diagnosis not present

## 2022-06-08 MED ORDER — LOSARTAN POTASSIUM 50 MG PO TABS: 25.0000 mg | ORAL_TABLET | Freq: Two times a day (BID) | ORAL | 3 refills | Status: DC

## 2022-06-08 NOTE — Patient Instructions (Addendum)
Medication Instructions:  Your physician has recommended you make the following change in your medication:   Change: Losartan '25mg'$  (half tablet) TWICE DAILY   *If you need a refill on your cardiac medications before your next appointment, please call your pharmacy*  Follow-Up: At [facility], you and your health needs are our priority.  As part of our continuing mission to provide you with exceptional heart care, we have created designated Provider Care Teams.  These Care Teams include your primary Cardiologist (physician) and Advanced Practice Providers (APPs -  Physician Assistants and Nurse Practitioners) who all work together to provide you with the care you need, when you need it.  We recommend signing up for the patient portal called "MyChart".  Sign up information is provided on this After Visit Summary.  MyChart is used to connect with patients for Virtual Visits (Telemedicine).  Patients are able to view lab/test results, encounter notes, upcoming appointments, etc.  Non-urgent messages can be sent to your provider as well.   To learn more about what you can do with MyChart, go to [email or web address].    Your next appointment:   FOLLOW UP AS SCHEDULED   Other Instructions If you decide you would like a referral to psychology simply call or send a MyChart message. Mental health is an important part of heart health. We refer to Dr. Elias Else. He is located at [facility] at [facility].   Important Information About Sugar

## 2022-06-08 NOTE — Progress Notes (Signed)
Office Visit    Patient Name: Tina Kelley Date of Encounter: 06/08/2022  PCP:  Leamon Arnt, [place]  Cardiologist:  Buford Dresser, MD  Advanced Practice Provider:  No care team member to display Electrophysiologist:  None      Chief Complaint    Tina Kelley is a 71 y.o. female presents today for labile blood pressure.    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 Chronic PPI   Mild intermittent asthma without complication 37/90/2409   Postmenopausal atrophic vaginitis 03/13/2013   Primary insomnia 11/16/2017   Seasonal allergic rhinitis due to pollen 03/29/2019   Past Surgical History:  Procedure Laterality Date   CHOLECYSTECTOMY     COLONOSCOPY     x2   NASAL SINUS SURGERY     THYROIDECTOMY Right 12/08/2021   Procedure: THYROID LOBECTOMY;  Surgeon: [name], MD;  Location: [facility];  Service: ENT;  Laterality: Right;   UPPER GASTROINTESTINAL ENDOSCOPY      Allergies  Allergies  Allergen Reactions   Aspirin Other (See Comments)    Asthma   Nsaids Shortness Of Breath   Penicillins Diarrhea    Told as a child   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 female with a hx of hypertension, GERD, asthma, arthritis, basal cell carcinoma of skin last seen 08/06/2021.  Family history notable for heart disease.  Establish November 2022 with Dr. Harrell Gave after referral due to palpitations and family history of heart disease.  Her palpitations were overall quiescent on metoprolol succinate 50 mg daily.   She was on rosuvastatin for hyperlipidemia and calcium score deferred as unlikely to change management and she was without anginal symptoms.  She presents today for follow-up.  She has not me that her husband passed away from lung cancer 2 months ago.  Offered my condolences.  Notes prior to his passing her blood pressure was often high but now she is getting low numbers.Checks BP at home with arm cuff. Has not been checked for accuracy but has 2 blood pressure cuffs at home that read similarly.   Bp at home most often 110s. Has had some low readings with SBP less than 90.  Notes poor p.o. intake since losing her husband..  Previously on losartan 25 mg twice daily though was changed to 50 mg daily and notes her blood pressure may have been more labile since that time.  Takes with her losartan and metoprolol in the morning. Some lightheadedness but no dizziness. Occasional mid sternal chest at rest which we discussed atypical for angina.  No exertional dyspnea.  No formal exercise routine.   EKGs/Labs/Other Studies Reviewed:   The following studies were reviewed today:  EKG:  No EKG today.   Recent Labs: 03/30/2022: ALT 27; BUN 14; Creatinine, Ser 1.05; Hemoglobin 14.8; Platelets 312.0; Potassium 4.5; Sodium 138; TSH 3.23  Recent Lipid Panel    Component Value Date/Time   CHOL 204 (H) 11/28/2021 0921   TRIG 169.0 (H)  11/28/2021 0921   HDL 73.30 11/28/2021 0921   CHOLHDL 3 11/28/2021 0921   VLDL 33.8 11/28/2021 0921   LDLCALC 97 11/28/2021 0921   LDLDIRECT 82.0 11/25/2020 0930   Home Medications   Current Meds  Medication Sig   acetaminophen (TYLENOL) 500 MG tablet Take 500 mg by mouth every 6 (six) hours as needed for moderate pain.   albuterol (VENTOLIN HFA) 108 (90 Base) MCG/ACT inhaler INHALE 2 PUFFS INTO THE LUNGS EVERY 6 HOURS AS NEEDED FOR WHEEZING OR SHORTNESS OF BREATH   ALPRAZolam (XANAX) 0.5 MG tablet Take 1 tablet (0.5 mg total) by mouth daily as needed for anxiety.   buPROPion  (WELLBUTRIN SR) 150 MG 12 hr tablet TAKE ONE TABLET BY MOUTH TWICE A DAY   cholestyramine (QUESTRAN) 4 g packet Take 0.5 packets (2 g total) by mouth 2 (two) times daily as needed.   EPINEPHrine (EPIPEN 2-PAK) 0.3 mg/0.3 mL IJ SOAJ injection Inject 0.3 mg into the muscle as needed for anaphylaxis. Use as instructed.   hydrocortisone (ANUSOL-HC) 25 MG suppository Place 1 suppository (25 mg total) rectally at bedtime as needed for hemorrhoids.   Lactobacillus Rhamnosus, GG, (CULTURELLE) CAPS Take 1 capsule by mouth daily.   losartan (COZAAR) 50 MG tablet TAKE ONE TABLET BY MOUTH DAILY   metoprolol succinate (TOPROL-XL) 50 MG 24 hr tablet Take 1 tablet (50 mg total) by mouth daily. Take with or immediately following a meal.   mupirocin ointment (BACTROBAN) 2 % Apply topically 2 (two) times daily.   Polyvinyl Alcohol-Povidone (REFRESH OP) Place 1 drop into both eyes daily as needed (dry eyes).   rosuvastatin (CRESTOR) 10 MG tablet Take 1 tablet (10 mg total) by mouth daily.   sodium chloride (OCEAN) 0.65 % SOLN nasal spray Place 1 spray into both nostrils as needed for congestion.   sucralfate (CARAFATE) 1 GM/10ML suspension TAKE TEN MILLILITERS BY MOUTH FOUR TIMES A DAY WITH MEALS AND AT BEDTIME   zolpidem (AMBIEN) 10 MG tablet TAKE ONE TABLET BY MOUTH EVERY NIGHT AT BEDTIME AS NEEDED FOR SLEEP     Review of Systems      All other systems reviewed and are otherwise negative except as noted above.  Physical Exam    VS:  BP 135/73   Pulse 69   Ht '5\' 5"'$  (1.651 m)   Wt 138 lb 3.2 oz (62.7 kg)   SpO2 96%   BMI 23.00 kg/m  , BMI Body mass index is 23 kg/m.  Wt Readings from Last 3 Encounters:  06/08/22 138 lb 3.2 oz (62.7 kg)  06/02/22 137 lb 12.8 oz (62.5 kg)  05/06/22 137 lb 12.8 oz (62.5 kg)    GEN: Well nourished, well developed, in no acute distress. HEENT: normal. Neck: Supple, no JVD, carotid bruits, or masses. Cardiac: RRR, no murmurs, rubs, or gallops. No clubbing, cyanosis,  edema.  Radials/PT 2+ and equal bilaterally.  Respiratory:  Respirations regular and unlabored, clear to auscultation bilaterally. GI: Soft, nontender, nondistended. MS: No deformity or atrophy. Skin: Warm and dry, no rash. Neuro:  Strength and sensation are intact. Psych: Normal affect.  Tearful during portions of exam when discussing her husband's passing.  Assessment & Plan    HTN - BP today in clinic slightly elevated 135/73.  Often getting hypotensive readings at home with SBP 110s. Notes lightheadedness only if SBP <100. No near syncope, syncope. BP has been labile since losing her husband two months ago. Notes poor PO intake and discussed this could contribute  to hypotension. Anticipate daily dosing of Losartan may not be lasting full 24 hours. Change Losartan to '25mg'$  BID. Anticipate Continue Toprol '50mg'$  QD. Monitor BP at home and bring cuff to next OV. She will contact us for persistent labile BP. Referred to PREP exercise program and referred to [facility] for 2 week free trial as she has goal to increase activity.   Grief - Lost her husband 2 months ago to lung cancer, condolences offered. Offered referral to psychology Dr. Elias Else which she prefers to think about.    Palpitations -quiescent on metoprolol succinate 50 mg daily.  Family history of heart disease / HLD- Stable with no anginal symptoms. No indication for ischemic evaluation.  Continue metoprolol, rosuvastatin. Heart healthy diet and regular cardiovascular exercise encouraged.           Disposition: Follow up in 2 month(s) with Buford Dresser, MD or APP.  Signed, Loel Dubonnet, NP 06/08/2022, 9:12 AM [place]

## 2022-06-10 ENCOUNTER — Telehealth: Payer: Self-pay | Admitting: *Deleted

## 2022-06-10 NOTE — Telephone Encounter (Signed)
Called patient in regards to PREP class referral. She is interested in participating at the [facility] and is willing to wait for availability in December 2023 class.

## 2022-06-16 ENCOUNTER — Ambulatory Visit (INDEPENDENT_AMBULATORY_CARE_PROVIDER_SITE_OTHER): Payer: Medicare PPO

## 2022-06-16 DIAGNOSIS — Z23 Encounter for immunization: Secondary | ICD-10-CM | POA: Diagnosis not present

## 2022-06-16 NOTE — Progress Notes (Signed)
Pt here for a Flu shot for Dr. Jonni Sanger.. Injection given in left deltoid. Pt tolerated well.

## 2022-07-01 ENCOUNTER — Ambulatory Visit: Payer: Medicare PPO | Admitting: Podiatry

## 2022-07-01 DIAGNOSIS — S91203A Unspecified open wound of unspecified great toe with damage to nail, initial encounter: Secondary | ICD-10-CM | POA: Diagnosis not present

## 2022-07-01 NOTE — Progress Notes (Signed)
   Chief Complaint  Patient presents with   Nail Problem    Patient states that left toe nail is coming off, patient dropped a chair leg on the great toe left foot.    HPI: 71 y.o. female presenting today for new complaint regarding an injury that was sustained about 3 months ago.  Patient states that she dropped a piece of furniture on her left great toenail.  She has no pain to the area but she is concerned because the toenail is now falling off.  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 Chronic PPI   Mild intermittent asthma without complication 07/68/0881   Postmenopausal atrophic vaginitis 03/13/2013   Primary insomnia 11/16/2017   Seasonal allergic rhinitis due to pollen 03/29/2019    Past Surgical History:  Procedure Laterality Date   CHOLECYSTECTOMY     COLONOSCOPY     x2   NASAL SINUS SURGERY     THYROIDECTOMY Right 12/08/2021   Procedure: THYROID LOBECTOMY;  Surgeon: [name], MD;  Location: [facility];  Service: ENT;  Laterality: Right;   UPPER GASTROINTESTINAL ENDOSCOPY      Allergies  Allergen Reactions   Aspirin Other (See Comments)    Asthma   Nsaids Shortness Of Breath   Penicillins Diarrhea    Told as a child   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  Dermatology: Loosely adhered nail plate noted to the left hallux with healthy underlying new nail growth along the proximal portion of the nail.  No erythema or edema or concern for infection.  There is no open wound  either.  Neurovascular status intact  Musculoskeletal Exam: No pedal deformities noted   Assessment: 1.  Traumatic loss of toenail left hallux nail plate   Plan of Care:  1. Patient evaluated.  2.  Light debridement of the loosely adhered nail plate was performed today.  It was removed without incident or bleeding.  Indication of healthy underlying new nail growth noted 3.  Recommend good supportive shoes and sneakers.  Advised against going barefoot 4.  Return to clinic as needed     Edrick Kins, DPM [facility]  Dr. Edrick Kins, DPM    [address], [place] [address]                Office [phone]  Fax [phone]

## 2022-07-15 ENCOUNTER — Ambulatory Visit: Payer: Medicare PPO | Admitting: Podiatry

## 2022-07-21 DIAGNOSIS — J309 Allergic rhinitis, unspecified: Secondary | ICD-10-CM | POA: Diagnosis not present

## 2022-07-28 DIAGNOSIS — J3489 Other specified disorders of nose and nasal sinuses: Secondary | ICD-10-CM | POA: Diagnosis not present

## 2022-07-28 DIAGNOSIS — J342 Deviated nasal septum: Secondary | ICD-10-CM | POA: Diagnosis not present

## 2022-07-28 DIAGNOSIS — J320 Chronic maxillary sinusitis: Secondary | ICD-10-CM | POA: Diagnosis not present

## 2022-07-31 ENCOUNTER — Ambulatory Visit: Payer: Medicare PPO | Admitting: Family Medicine

## 2022-08-02 ENCOUNTER — Other Ambulatory Visit: Payer: Self-pay | Admitting: Internal Medicine

## 2022-08-10 ENCOUNTER — Encounter (HOSPITAL_BASED_OUTPATIENT_CLINIC_OR_DEPARTMENT_OTHER): Payer: Self-pay | Admitting: Cardiology

## 2022-08-10 ENCOUNTER — Ambulatory Visit (HOSPITAL_BASED_OUTPATIENT_CLINIC_OR_DEPARTMENT_OTHER): Payer: Medicare PPO | Admitting: Cardiology

## 2022-08-10 VITALS — BP 136/84 | HR 60 | Ht 65.0 in | Wt 142.1 lb

## 2022-08-10 DIAGNOSIS — I10 Essential (primary) hypertension: Secondary | ICD-10-CM | POA: Diagnosis not present

## 2022-08-10 DIAGNOSIS — R002 Palpitations: Secondary | ICD-10-CM | POA: Diagnosis not present

## 2022-08-10 DIAGNOSIS — Z7189 Other specified counseling: Secondary | ICD-10-CM

## 2022-08-10 DIAGNOSIS — E785 Hyperlipidemia, unspecified: Secondary | ICD-10-CM | POA: Diagnosis not present

## 2022-08-10 NOTE — Patient Instructions (Signed)
Medication Instructions:  Your physician recommends that you continue on your current medications as directed. Please refer to the Current Medication list given to you today.   *If you need a refill on your cardiac medications before your next appointment, please call your pharmacy*  Lab Work: NONE  Testing/Procedures: NONE  Follow-Up: At [facility], you and your health needs are our priority.  As part of our continuing mission to provide you with exceptional heart care, we have created designated Provider Care Teams.  These Care Teams include your primary Cardiologist (physician) and Advanced Practice Providers (APPs -  Physician Assistants and Nurse Practitioners) who all work together to provide you with the care you need, when you need it.  We recommend signing up for the patient portal called "MyChart".  Sign up information is provided on this After Visit Summary.  MyChart is used to connect with patients for Virtual Visits (Telemedicine).  Patients are able to view lab/test results, encounter notes, upcoming appointments, etc.  Non-urgent messages can be sent to your provider as well.   To learn more about what you can do with MyChart, go to [email or web address].    Your next appointment:   12 month(s)  The format for your next appointment:   In Person  Provider:   Bridgette Christopher, MD        

## 2022-08-10 NOTE — Progress Notes (Signed)
Cardiology Office Note:    Date:  08/10/2022   ID:  Tina Kelley, DOB 06/05/1951, MRN [id number]  PCP:  Leamon Arnt, MD  Cardiologist:  Buford Dresser, MD  Referring MD: Leamon Arnt, MD   CC: Follow-up  History of Present Illness:    Tina Kelley is a 71 y.o. female with a hx of hypertension, GERD, asthma, arthritis, and basal cell carcinoma of skin, who is seen for follow-up today. She was initially seen 08/06/2021 as a new consult at the request of Leamon Arnt, MD to establish care. She is concerned about her cardiovascular risk.  Cardiovascular risk factors: Prior clinical ASCVD: None Comorbid conditions: Hypertension, borderline hyperlipidemia, Metabolic syndrome/Obesity: Highest adult weight 160 lbs. Chronic inflammatory conditions: Arthritis, chronic back pain Tobacco use history: None Family history: Father had 1st heart attack at 60 yo, 2nd heart attack, CABG, CHF, died at 72 yo. Paternal uncles died of heart attack and stroke. Paternal grandfather had a heart attack. Mother died of small-cell lung cancer. Maternal grandmother had pancreatic cancer.  Prior cardiac testing and/or incidental findings on other testing (ie coronary calcium): Normal Stress test 7-8 years ago, Normal Echo Exercise level: Previously walked a lot, not much formal exercise now. Stays active with work around the house. She is unable to lift as much as she used to, but attributes this to her back pain. Current diet: Enjoys pasta, sweets, and broccoli.   At her last appointment she reported occasional "flutters" that occur mostly when she is tired, stressed, or hungry. These flutters usually occurred 2-3 times a week while at rest. Metoprolol succinate 50 mg is known to help improve her palpitations. We discussed calcium score, but as she is already on a statin, this would not likely change management. She would like to wait and see what her next cholesterol numbers are. If they are elevated,  she wishes to re-discuss CAC score.   She followed up with Laurann Montana, NP on 06/08/2022 and was unfortunately grieving the loss of her husband due to lung cancer. Her at home blood pressures were mostly in the 063K systolic with some readings less than 90 systolic. BP was slightly elevated in clinic at 135/73. It wa felt her daily Losartan was not lasting a full 24 hours. Losartan was changed to 25 mg BID. Also referred to the PREP program.  Today, she complains of chronic issues with acid reflux and sinus issues. She is sometimes short of breath and feels as though there is something in her throat she cannot cough up.  It is difficult for her to confirm if she suffers from chest pain due to her acid reflux. Also she has a few palpitations, usually if she has not had anything to eat for a time.  In clinic today her blood pressure was elevated at 144/98 and improved to 136/84 on manual recheck. Last night her blood pressure was fine, and notes that her diastolic pressures are 16-01 on average. She is compliant with her regimen of 1/2 tablet of losartan in the morning and in the evening. She believes there has been some improvement in her blood pressure management. However, she continues to notice some labile blood pressures.   She denies any peripheral edema, lightheadedness, headaches, syncope, orthopnea, or PND.   Past Medical History:  Diagnosis Date   ACE-inhibitor cough 11/25/2020   Allergy    seasonal   Arthritis    Basal cell carcinoma of skin 02/29/2012   basal cell carcinoma on her  left melolabial fold approximately 30 years  ago   Diverticulitis    Essential hypertension 03/29/2019   GERD (gastroesophageal reflux disease) 03/29/2019   Chronic, normal EGD. Chronic PPI   Mild intermittent asthma without complication 46/96/2952   Postmenopausal atrophic vaginitis 03/13/2013   Primary insomnia 11/16/2017   Seasonal allergic rhinitis due to pollen 03/29/2019    Past Surgical  History:  Procedure Laterality Date   CHOLECYSTECTOMY     COLONOSCOPY     x2   NASAL SINUS SURGERY     THYROIDECTOMY Right 12/08/2021   Procedure: THYROID LOBECTOMY;  Surgeon: [name], MD;  Location: [place];  Service: ENT;  Laterality: Right;   UPPER GASTROINTESTINAL ENDOSCOPY      Current Medications: Current Outpatient Medications on File Prior to Visit  Medication Sig   acetaminophen (TYLENOL) 500 MG tablet Take 500 mg by mouth every 6 (six) hours as needed for moderate pain.   albuterol (VENTOLIN HFA) 108 (90 Base) MCG/ACT inhaler INHALE 2 PUFFS INTO THE LUNGS EVERY 6 HOURS AS NEEDED FOR WHEEZING OR SHORTNESS OF BREATH   ALPRAZolam (XANAX) 0.5 MG tablet Take 1 tablet (0.5 mg total) by mouth daily as needed for anxiety.   buPROPion (WELLBUTRIN SR) 150 MG 12 hr tablet TAKE ONE TABLET BY MOUTH TWICE A DAY   cholestyramine (QUESTRAN) 4 g packet Take 0.5 packets (2 g total) by mouth 2 (two) times daily as needed.   EPINEPHrine (EPIPEN 2-PAK) 0.3 mg/0.3 mL IJ SOAJ injection Inject 0.3 mg into the muscle as needed for anaphylaxis. Use as instructed.   hydrocortisone (ANUSOL-HC) 25 MG suppository Place 1 suppository (25 mg total) rectally at bedtime as needed for hemorrhoids.   Lactobacillus Rhamnosus, GG, (CULTURELLE) CAPS Take 1 capsule by mouth daily.   losartan (COZAAR) 50 MG tablet Take 0.5 tablets (25 mg total) by mouth in the morning and at bedtime.   metoprolol succinate (TOPROL-XL) 50 MG 24 hr tablet Take 1 tablet (50 mg total) by mouth daily. Take with or immediately following a meal.   mupirocin ointment (BACTROBAN) 2 % Apply topically 2 (two) times daily.   pantoprazole (PROTONIX) 40 MG tablet TAKE 1 TABLET BY MOUTH TWICE A DAY   Polyvinyl Alcohol-Povidone (REFRESH OP) Place 1 drop into both eyes daily as needed (dry eyes).   rosuvastatin (CRESTOR) 10 MG tablet Take 1 tablet (10 mg total) by mouth daily.   sodium chloride (OCEAN) 0.65 % SOLN nasal spray Place 1 spray into both  nostrils as needed for congestion.   sucralfate (CARAFATE) 1 GM/10ML suspension TAKE TEN MILLILITERS BY MOUTH FOUR TIMES A DAY WITH MEALS AND AT BEDTIME   zolpidem (AMBIEN) 10 MG tablet TAKE ONE TABLET BY MOUTH EVERY NIGHT AT BEDTIME AS NEEDED FOR SLEEP   No current facility-administered medications on file prior to visit.     Allergies:   Aspirin, Nsaids, Penicillins, Yellow hornet venom [hornet venom], and Lisinopril   Social History   Tobacco Use   Smoking status: Never   Smokeless tobacco: Never  Vaping Use   Vaping Use: Never used  Substance Use Topics   Alcohol use: Yes    Comment: a glass of wine a couple times per week   Drug use: Never    Family History: family history includes Healthy in her son and son; Heart attack in her father; Heart disease in her father; High blood pressure in her brother; Lung cancer in her mother. There is no history of Colon cancer, Esophageal cancer, Rectal cancer,  or Stomach cancer.  ROS:   Please see the history of present illness. (+) Acid reflux (+) Sinus congestion (+) Shortness of breath (+) Palpitations All other systems are reviewed and negative.   EKGs/Labs/Other Studies Reviewed:    The following studies were reviewed today:  Nuclear stress test 12/29/2016, [facility] protocol, 6 min 30 sec HR baseline 71, peak 153, 100% MPHR BP 116/66 baseline, 142/84 peak No ischemic ECG changes Normal perfusion at rest and stress, TID 0.84, EF 81% No regional wall motion abnormalities  EKG:  EKG is personally reviewed.   08/10/2022:  NSR at 60 bpm 08/06/2021: NSR at 65 bpm  Recent Labs: 03/30/2022: ALT 27; BUN 14; Creatinine, Ser 1.05; Hemoglobin 14.8; Platelets 312.0; Potassium 4.5; Sodium 138; TSH 3.23   Recent Lipid Panel    Component Value Date/Time   CHOL 204 (H) 11/28/2021 0921   TRIG 169.0 (H) 11/28/2021 0921   HDL 73.30 11/28/2021 0921   CHOLHDL 3 11/28/2021 0921   VLDL 33.8 11/28/2021 0921    LDLCALC 97 11/28/2021 0921   LDLDIRECT 82.0 11/25/2020 0930    Physical Exam:    VS:  BP 136/84 (BP Location: Left Arm, Patient Position: Sitting, Cuff Size: Normal)   Pulse 60   Ht '5\' 5"'$  (1.651 m)   Wt 142 lb 1.6 oz (64.5 kg)   BMI 23.65 kg/m     Wt Readings from Last 3 Encounters:  08/10/22 142 lb 1.6 oz (64.5 kg)  06/08/22 138 lb 3.2 oz (62.7 kg)  06/02/22 137 lb 12.8 oz (62.5 kg)    GEN: Well nourished, well developed in no acute distress HEENT: Normal, moist mucous membranes NECK: No JVD CARDIAC: regular rhythm, normal S1 and S2, no rubs or gallops. No murmur. VASCULAR: Radial and DP pulses 2+ bilaterally. No carotid bruits RESPIRATORY:  Clear to auscultation without rales, wheezing or rhonchi  ABDOMEN: Soft, non-tender, non-distended MUSCULOSKELETAL:  Ambulates independently SKIN: Warm and dry, no edema NEUROLOGIC:  Alert and oriented x 3. No focal neuro deficits noted. PSYCHIATRIC:  Normal affect    ASSESSMENT:    1. Essential hypertension   2. Heart palpitations   3. Hyperlipidemia, unspecified hyperlipidemia type   4. Counseling on health promotion and disease prevention   5. Cardiac risk counseling     PLAN:    Palpitations/fluttering -on metoprolol succinate 50 mg daily  Hyperlipidemia -on rosuvastatin -cholestyramine is for chronic diarrhea  Hypertension -at goal today -continue losartan 50 mg daily  History of premature CV disease -she feels her chest discomfort is atypical at this time, but we did discuss further testing if symptoms either become more pronounced or more frequent -reviewed red flag warning signs that need immediate medical attention   Cardiac risk counseling and prevention recommendations: -recommend heart healthy/Mediterranean diet, with whole grains, fruits, vegetable, fish, lean meats, nuts, and olive oil. Limit salt. -recommend moderate walking, 3-5 times/week for 30-50 minutes each session. Aim for at least 150 minutes.week.  Goal should be pace of 3 miles/hours, or walking 1.5 miles in 30 minutes -recommend avoidance of tobacco products. Avoid excess alcohol. -ASCVD risk score: The 10-year ASCVD risk score (Arnett DK, et al., 2019) is: 16.9%   Values used to calculate the score:     Age: 42 years     Sex: Female     Is Non-Hispanic African American: No     Diabetic: No     Tobacco smoker: No     Systolic Blood Pressure: 885 mmHg  Is BP treated: Yes     HDL Cholesterol: 73.3 mg/dL     Total Cholesterol: 204 mg/dL    Plan for follow up: 1 year or sooner as needed.  Buford Dresser, MD, PhD, [facility]    Medication Adjustments/Labs and Tests Ordered: Current medicines are reviewed at length with the patient today.  Concerns regarding medicines are outlined above.   Orders Placed This Encounter  Procedures   EKG 12-Lead   No orders of the defined types were placed in this encounter.  Patient Instructions  Medication Instructions:  Your physician recommends that you continue on your current medications as directed. Please refer to the Current Medication list given to you today.   *If you need a refill on your cardiac medications before your next appointment, please call your pharmacy*  Lab Work: NONE  Testing/Procedures: NONE  Follow-Up: At [facility], you and your health needs are our priority.  As part of our continuing mission to provide you with exceptional heart care, we have created designated Provider Care Teams.  These Care Teams include your primary Cardiologist (physician) and Advanced Practice Providers (APPs -  Physician Assistants and Nurse Practitioners) who all work together to provide you with the care you need, when you need it.  We recommend signing up for the patient portal called "MyChart".  Sign up information is provided on this After Visit Summary.  MyChart is used to connect with patients for Virtual Visits (Telemedicine).   Patients are able to view lab/test results, encounter notes, upcoming appointments, etc.  Non-urgent messages can be sent to your provider as well.   To learn more about what you can do with MyChart, go to [email or web address].    Your next appointment:   12 month(s)  The format for your next appointment:   In Person  Provider:   Buford Dresser, MD                   [facility],acting as a scribe for Buford Dresser, MD.,have documented all relevant documentation on the behalf of Buford Dresser, MD,as directed by  Buford Dresser, MD while in the presence of Buford Dresser, MD.  I, Buford Dresser, MD, have reviewed all documentation for this visit. The documentation on 08/10/22 for the exam, diagnosis, procedures, and orders are all accurate and complete.   Signed, Buford Dresser, MD PhD 08/10/2022     [place]

## 2022-08-12 NOTE — Progress Notes (Unsigned)
08/12/2022 Tina Kelley [id number] 1951/03/26   Chief Complaint:  History of Present Illness: Tina Kelley is a 71 year old female with a past medical history of hypertension, palpitations, asthma, thyroid cancer s/p thyroidectomy 11/2021, GERD, C. diff and diverticulitis. Past cholecystectomy and sinus surgery. She underwent an EGD 04/23/2021 due to having voice hoarseness and a globus sensation.      Latest Ref Rng & Units 03/30/2022    9:49 AM 11/28/2021    9:21 AM 11/25/2020    9:30 AM  CBC  WBC 4.0 - 10.5 K/uL 8.0  7.2  6.7   Hemoglobin 12.0 - 15.0 g/dL 14.8  13.9  14.4   Hematocrit 36.0 - 46.0 % 44.7  41.2  42.4   Platelets 150.0 - 400.0 K/uL 312.0  295.0  279.0        Latest Ref Rng & Units 03/30/2022    9:49 AM 11/28/2021    9:21 AM 11/25/2020    9:30 AM  CMP  Glucose 70 - 99 mg/dL 115  106  117   BUN 6 - 23 mg/dL '14  12  14   '$ Creatinine 0.40 - 1.20 mg/dL 1.05  1.02  0.94   Sodium 135 - 145 mEq/L 138  139  138   Potassium 3.5 - 5.1 mEq/L 4.5  4.5  4.0   Chloride 96 - 112 mEq/L 101  101  103   CO2 19 - 32 mEq/L '22  28  27   '$ Calcium 8.4 - 10.5 mg/dL 9.9  9.7  9.5   Total Protein 6.0 - 8.3 g/dL 7.2  6.8  6.8   Total Bilirubin 0.2 - 1.2 mg/dL 0.7  0.9  0.7   Alkaline Phos 39 - 117 U/L 70  64  68   AST 0 - 37 U/L 32  26  22   ALT 0 - 35 U/L '27  20  20      '$ EGD 04/23/2021: Normal esophagus. Biopsied. - Gastroesophageal flap valve classified as Hill Grade II (fold present, opens with respiration). Possible, small, sliding hiatal hernia. - Normal stomach. - Normal examined duodenum.  Colonoscopy 10/27/2019: The examined portion of the ileum was normal. - One 3 mm polyp in the cecum, removed with a cold snare. Resected and retrieved. - Mild diverticulosis in the recto-sigmoid colon and in the distal sigmoid colon. There was narrowing of the colon in association with the diverticular opening. - Small internal hemorrhoids. - 7 year colonoscopy    Current Medications,  Allergies, Past Medical History, Past Surgical History, Family History and Social History were reviewed in [employer] record.   Review of Systems:   Constitutional: Negative for fever, sweats, chills or weight loss.  Respiratory: Negative for shortness of breath.   Cardiovascular: Negative for chest pain, palpitations and leg swelling.  Gastrointestinal: See HPI.  Musculoskeletal: Negative for back pain or muscle aches.  Neurological: Negative for dizziness, headaches or paresthesias.    Physical Exam: There were no vitals taken for this visit. General: Well developed, w   ***female in no acute distress. Head: Normocephalic and atraumatic. Eyes: No scleral icterus. Conjunctiva pink . Ears: Normal auditory acuity. Mouth: Dentition intact. No ulcers or lesions.  Lungs: Clear throughout to auscultation. Heart: Regular rate and rhythm, no murmur. Abdomen: Soft, nontender and nondistended. No masses or hepatomegaly. Normal bowel sounds x 4 quadrants.  Rectal: *** Musculoskeletal: Symmetrical with no gross deformities. Extremities: No edema. Neurological: Alert oriented x 4. No focal  deficits.  Psychological: Alert and cooperative. Normal mood and affect  Assessment and Recommendations: ***

## 2022-08-13 ENCOUNTER — Encounter: Payer: Self-pay | Admitting: Nurse Practitioner

## 2022-08-13 ENCOUNTER — Ambulatory Visit: Payer: Medicare PPO | Admitting: Nurse Practitioner

## 2022-08-13 VITALS — BP 118/82 | HR 72 | Ht 65.0 in | Wt 141.1 lb

## 2022-08-13 DIAGNOSIS — R09A2 Foreign body sensation, throat: Secondary | ICD-10-CM | POA: Diagnosis not present

## 2022-08-13 MED ORDER — HYDROCORTISONE ACETATE 25 MG RE SUPP: 25.0000 mg | Freq: Every evening | RECTAL | 5 refills | Status: DC | PRN

## 2022-08-13 NOTE — Patient Instructions (Signed)
We have sent the following medications to your pharmacy for you to pick up at your convenience: Anusol suppositories  Thank you for trusting me with your gastrointestinal care!   Carl Best, CRNP

## 2022-08-16 NOTE — Progress Notes (Signed)
Addendum: Reviewed and agree with assessment and management plan. Given ongoing LPR type symptoms and less heartburn I think knowing whether or not she is having uncontrolled reflux would definitely help in management. She is currently on pantoprazole 40 mg twice daily and this should be taken before first and last meal of the day. 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  I would likely discontinue Carafate at this time.  If symptoms persist and reflux is controlled by pH study we could consider treatment for functional heartburn with either buspirone or amitriptyline. Hezzie Karim, Lajuan Lines, MD

## 2022-08-17 NOTE — Progress Notes (Signed)
Tina Kelley, pls contact patient with Dr. Vena Rua recommendations as noted in his addendum.   Please have her  stop Carafate (Sucralfate).  Schedule her for a 24hr pH impendence study on PPI therapy.  Please make sure she continues to take Pantoprazole 40 mg p.o. twice daily to be taken 30 minutes before breakfast and dinner.  Thank you

## 2022-08-18 ENCOUNTER — Encounter (HOSPITAL_BASED_OUTPATIENT_CLINIC_OR_DEPARTMENT_OTHER): Payer: Self-pay

## 2022-08-19 ENCOUNTER — Encounter (HOSPITAL_BASED_OUTPATIENT_CLINIC_OR_DEPARTMENT_OTHER): Payer: Self-pay | Admitting: Cardiology

## 2022-08-19 ENCOUNTER — Telehealth: Payer: Self-pay

## 2022-08-19 NOTE — Telephone Encounter (Signed)
Pt notified of Dr. Hilarie Fredrickson recommendations: stop Carafate (Sucralfate).  continue to take Pantoprazole 40 mg p.o. twice daily to be taken 30 minutes before breakfast and dinner.  Pt notified that she will be contacted next week with the details of the 24hr pH impendence study on PPI therapy that Dr. Hilarie Fredrickson is recommending: Pt verbalized understanding with all questions answered.

## 2022-08-19 NOTE — Telephone Encounter (Signed)
Routed Note  Author: Noralyn Pick, NP Service: Gastroenterology Author Type: Nurse Practitioner  Filed: 08/17/2022 12:34 PM Encounter Date: 08/13/2022 Status: Signed  Editor: Noralyn Pick, NP (Nurse Practitioner)   Remo Lipps, pls contact patient with Dr. Vena Rua recommendations as noted in his addendum.    Please have her  stop Carafate (Sucralfate).   Schedule her for a 24hr pH impendence study on PPI therapy.  Please make sure she continues to take Pantoprazole 40 mg p.o. twice daily to be taken 30 minutes before breakfast and dinner.   Thank you

## 2022-08-24 ENCOUNTER — Other Ambulatory Visit: Payer: Self-pay

## 2022-08-24 DIAGNOSIS — R490 Dysphonia: Secondary | ICD-10-CM

## 2022-08-24 DIAGNOSIS — R09A2 Foreign body sensation, throat: Secondary | ICD-10-CM

## 2022-08-24 DIAGNOSIS — K219 Gastro-esophageal reflux disease without esophagitis: Secondary | ICD-10-CM

## 2022-08-25 ENCOUNTER — Encounter (HOSPITAL_BASED_OUTPATIENT_CLINIC_OR_DEPARTMENT_OTHER): Payer: Self-pay

## 2022-08-26 NOTE — Telephone Encounter (Signed)
Pt stated that she had a lot going on right now and is concerned about scheduling the test. Pt stated that she is feeling better: Pt notified that this test is scheduled for April: Pt stated that we could leave it for now and she would call back in March is she decides to cancel. Pt was notified that I would be calling her back in several days with details about the test: Pt verbalized understanding with all questions answered.

## 2022-09-01 ENCOUNTER — Telehealth: Payer: Self-pay

## 2022-09-01 NOTE — Telephone Encounter (Signed)
Called to discuss PREP Class Schedule at Tina Kelley, she wants to attend 12/12 every T/Th 10-11:15; assessment visit scheduled for 12/6 at 11am.

## 2022-09-02 NOTE — Progress Notes (Signed)
[facility] PREP Evaluation  Patient Details  Name: Tina Kelley MRN: [id number] Date of Birth: 05/03/1951 Age: 71 y.o. PCP: Leamon Arnt, MD  Vitals:   09/02/22 1130  BP: 100/64  Pulse: 75  SpO2: 97%  Weight: 138 lb 6.4 oz (62.8 kg)     [facility] Eval - 09/02/22 1100       [facility] "PREP" Location   [facility] "PREP" Location [facility]      Referral    Referring Provider C. Walker    Reason for referral Hypertension;Inactivity    Program Start Date 09/08/22      Measurement   Waist Circumference 35 inches    Hip Circumference 41 inches    Body fat 39.4 percent      Information for Trainer   Goals --   Establish an exercise routine/program, both cardio and strength training   Current Exercise --   some walking   Orthopedic Concerns --   back pain/strains   Pertinent Medical History --   HTN, asthma, high cholesterol   Medications that affect exercise Beta blocker;Asthma inhaler      Timed Up and Go (TUGS)   Timed Up and Go Low risk <9 seconds      Mobility and Daily Activities   I find it easy to walk up or down two or more flights of stairs. 3    I have no trouble taking out the trash. 4    I do housework such as vacuuming and dusting on my own without difficulty. 4    I can easily lift a gallon of milk (8lbs). 4    I can easily walk a mile. 3    I have no trouble reaching into high cupboards or reaching down to pick up something from the floor. 4    I do not have trouble doing out-door work such as Armed forces logistics/support/administrative officer, raking leaves, or gardening. 1      Mobility and Daily Activities   I feel younger than my age. 3    I feel independent. 4    I feel energetic. 3    I live an active life.  2    I feel strong. 3    I feel healthy. 3    I feel active as other people my age. 3      How fit and strong are you.   Fit and Strong Total Score 44            Past Medical History:  Diagnosis Date   ACE-inhibitor cough 11/25/2020   Allergy    seasonal   Arthritis     Basal cell carcinoma of skin 02/29/2012   basal cell carcinoma on her left melolabial fold approximately 30 years  ago   Diverticulitis    Essential hypertension 03/29/2019   GERD (gastroesophageal reflux disease) 03/29/2019   Chronic, normal EGD. Chronic PPI   Mild intermittent asthma without complication 59/56/3875   Postmenopausal atrophic vaginitis 03/13/2013   Primary insomnia 11/16/2017   Seasonal allergic rhinitis due to pollen 03/29/2019   Past Surgical History:  Procedure Laterality Date   CHOLECYSTECTOMY     COLONOSCOPY     x2   NASAL SINUS SURGERY     THYROIDECTOMY Right 12/08/2021   Procedure: THYROID LOBECTOMY;  Surgeon: [name], MD;  Location: [place];  Service: ENT;  Laterality: Right;   UPPER GASTROINTESTINAL ENDOSCOPY     Social History   Tobacco Use  Smoking Status Never  Smokeless  Tobacco Never  To begin PREP class at [facility] 12/12, every T/Th 10-11:15  Tina Kelley 09/02/2022, 11:37 AM

## 2022-09-03 ENCOUNTER — Other Ambulatory Visit: Payer: Self-pay

## 2022-09-03 DIAGNOSIS — K219 Gastro-esophageal reflux disease without esophagitis: Secondary | ICD-10-CM

## 2022-09-03 DIAGNOSIS — R490 Dysphonia: Secondary | ICD-10-CM

## 2022-09-03 DIAGNOSIS — R09A2 Foreign body sensation, throat: Secondary | ICD-10-CM

## 2022-09-03 NOTE — Telephone Encounter (Signed)
Pt made aware of Dr. Hilarie Fredrickson recommendations: Pt was scheduled for 24 hr ph with Esophageal Manometry on 01/20/2023 at 8:30 AM at [facility]: Pt made aware: Case # [id number]:  Prep instructions were sent to pt via my chart. Pt made aware Ambulatory referral to GI placed in Epic: Pt verbalized understanding with all questions answered.

## 2022-09-12 ENCOUNTER — Other Ambulatory Visit: Payer: Self-pay | Admitting: Family Medicine

## 2022-09-15 NOTE — Progress Notes (Signed)
[facility] PREP Weekly Session  Patient Details  Name: Tina Kelley MRN: [id number] Date of Birth: July 07, 1951 Age: 71 y.o. PCP: Leamon Arnt, MD  Vitals:   09/15/22 1202  Weight: 138 lb (62.6 kg)     [facility] Weekly seesion - 09/15/22 1200       [facility] "PREP" Location   [facility] "PREP" Location [name]      Weekly Session   Topic Discussed Importance of resistance training;Other ways to be active   Work up to 150 min cardio/wk, strength training 2-3 times a week for 20-40 minutes; sitting no more than 30 minutes   Minutes exercised this week 50 minutes    Classes attended to date [place] 09/15/2022, 12:02 PM

## 2022-09-18 ENCOUNTER — Other Ambulatory Visit: Payer: Self-pay | Admitting: Family Medicine

## 2022-09-29 NOTE — Progress Notes (Signed)
[facility] PREP Weekly Session  Patient Details  Name: Tina Kelley MRN: [id number] Date of Birth: Aug 15, 1951 Age: 72 y.o. PCP: Leamon Arnt, MD  Vitals:   09/29/22 1512  Weight: 137 lb (62.1 kg)     [facility] Weekly seesion - 09/29/22 1500       [facility] "PREP" Location   [facility] "PREP" Location [place]      Weekly Session   Topic Discussed Health habits   Sugar demo, importance of water: goal is 1/2 body in oz/daily   Minutes exercised this week 150 minutes    Classes attended to date [place] 09/29/2022, 3:13 PM

## 2022-10-06 NOTE — Progress Notes (Signed)
[facility] PREP Weekly Session  Patient Details  Name: Tina Kelley MRN: [id number] Date of Birth: [birth year] Age: 72 y.o. PCP: Leamon Arnt, MD  Vitals:   10/06/22 1335  Weight: 137 lb (62.1 kg)     [facility] Weekly seesion - 10/06/22 1300       [facility] "PREP" Location   [facility] "PREP" Location [name]      Weekly Session   Topic Discussed Restaurant Eating   Salt demo; label review---importance of serviing size and ingredients listed.   Minutes exercised this week 95 minutes    Classes attended to date [place] 10/06/2022, 1:36 PM

## 2022-10-07 DIAGNOSIS — H1013 Acute atopic conjunctivitis, bilateral: Secondary | ICD-10-CM | POA: Diagnosis not present

## 2022-10-13 ENCOUNTER — Ambulatory Visit: Payer: Medicare PPO | Admitting: Family Medicine

## 2022-10-19 ENCOUNTER — Ambulatory Visit: Payer: Medicare PPO | Admitting: Family Medicine

## 2022-10-19 ENCOUNTER — Encounter: Payer: Self-pay | Admitting: Family Medicine

## 2022-10-19 VITALS — BP 110/80 | HR 67 | Temp 97.8°F | Ht 65.0 in | Wt 138.8 lb

## 2022-10-19 DIAGNOSIS — K9089 Other intestinal malabsorption: Secondary | ICD-10-CM

## 2022-10-19 DIAGNOSIS — J309 Allergic rhinitis, unspecified: Secondary | ICD-10-CM

## 2022-10-19 DIAGNOSIS — K2100 Gastro-esophageal reflux disease with esophagitis, without bleeding: Secondary | ICD-10-CM

## 2022-10-19 DIAGNOSIS — R09A2 Foreign body sensation, throat: Secondary | ICD-10-CM

## 2022-10-19 DIAGNOSIS — M546 Pain in thoracic spine: Secondary | ICD-10-CM

## 2022-10-19 MED ORDER — CHOLESTYRAMINE 4 G PO PACK: 2.0000 g | PACK | Freq: Two times a day (BID) | ORAL | 11 refills | Status: DC | PRN

## 2022-10-19 NOTE — Patient Instructions (Signed)
Please follow up as scheduled for your next visit with me: 12/02/2022   Start zyrtec or xyzal in stead of claritin and use your flonase for the next several weeks. We will add an antibiotic if you are not improving.   Use tylenol, heat and stretching for your back pain.   If you have any questions or concerns, please don't hesitate to send me a message via MyChart or call the office at [phone]. Thank you for visiting with [place] today! It's our pleasure caring for you.

## 2022-10-19 NOTE — Progress Notes (Signed)
Subjective  CC:  Chief Complaint  Patient presents with   Sinus Problem    Pt stated that she has been experiencing some drainage/flem x1 month    HPI: Tina Kelley is a 72 y.o. female who presents to the office today to address the problems listed above in the chief complaint. Complains of postnasal drainage, sneezing, allergic conjunctivitis diagnosed by eye doctor, cough and some sinus pressure.  She has seen ENT.  Back in November he went to use an antibiotic but she could not tolerate it.  Remember the name.  She has no fevers.  Some sinus pressure but that is chronic.  No cough or shortness of breath.  She is taking Claritin and using saline lavages.  She prefers to stay off antibiotics given history of C. difficile about 10 years ago. Globus sensation with history of GERD: Seeing GI.  I reviewed notes.  To schedule esophageal manometry in April.  She is on a PPI. She needs refill for her bile salt induced diarrhea medication.  That is stable. Complains of right-sided upper back pain that started 1 to 2 weeks ago after she was taking care of her grandchildren.  She was lifting.  No low back pain.  No neurologic symptoms.  She is intolerant to NSAIDs and is taking Tylenol.  No radicular symptoms  Assessment  1. Chronic allergic rhinitis   2. Bile salt-induced diarrhea   3. Globus sensation   4. Gastroesophageal reflux disease with esophagitis without hemorrhage   5. Acute right-sided thoracic back pain      Plan  Chronic allergies plus minus sinusitis: Respiratory changes clear mostly.  She did have a CT scan back in November showing swelling of the sinus cavities.  She could not complete the antibiotics.  She prefers to not use antibiotics unless needed.  Will treat for allergies: Changed to Zyrtec or Xyzal and add Flonase.  If not proving, will add antibiotic. Refilled Questran for bile salt diarrhea Globus sensation GERD: She will follow-up with GI.  I reviewed recent notes and  reports of EGD.  Continue PPI.  Carafate was stopped Right-sided thoracic back pain, likely strained muscle.  Heat, Tylenol.  Patient defers muscle relaxer.  Stretching and if not proving let me know.  Follow up: For complete physical in March 11/09/2022  No orders of the defined types were placed in this encounter.  Meds ordered this encounter  Medications   cholestyramine (QUESTRAN) 4 g packet    Sig: Take 0.5 packets (2 g total) by mouth 2 (two) times daily as needed.    Dispense:  60 each    Refill:  11      I reviewed the patients updated PMH, FH, and SocHx.    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    Priority: Medium    Primary localized osteoarthrosis of multiple sites 09/04/2019    Priority: Medium    Mild intermittent asthma without complication 31/54/0086    Priority: Medium    Gastroesophageal reflux disease 10/29/2015    Priority: Medium    Dyspareunia 03/13/2013    Priority: Medium  Basal cell carcinoma of skin 02/29/2012    Priority: Medium    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    Priority: Low   ACE-inhibitor cough 11/25/2020    Priority: Low   Chronic allergic rhinitis 09/04/2019    Priority: Low   Dry eye syndrome 03/29/2019    Priority: Low   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   albuterol (VENTOLIN HFA) 108 (90 Base) MCG/ACT inhaler INHALE 2 PUFFS  INTO THE LUNGS EVERY 6 HOURS AS NEEDED FOR WHEEZING OR SHORTNESS OF BREATH   ALPRAZolam (XANAX) 0.5 MG tablet Take 1 tablet (0.5 mg total) by mouth daily as needed for anxiety.   buPROPion (WELLBUTRIN SR) 150 MG 12 hr tablet TAKE ONE TABLET BY MOUTH TWICE A DAY   EPINEPHrine (EPIPEN 2-PAK) 0.3 mg/0.3 mL IJ SOAJ injection Inject 0.3 mg into the muscle as needed for anaphylaxis. Use as instructed.   hydrocortisone (ANUSOL-HC) 25 MG suppository Place 1 suppository (25 mg total) rectally at bedtime as needed for hemorrhoids.   Lactobacillus Rhamnosus, GG, (CULTURELLE) CAPS Take 1 capsule by mouth daily.   losartan (COZAAR) 50 MG tablet Take 0.5 tablets (25 mg total) by mouth in the morning and at bedtime.   metoprolol succinate (TOPROL-XL) 50 MG 24 hr tablet Take 1 tablet (50 mg total) by mouth daily. Take with or immediately following a meal.   mupirocin ointment (BACTROBAN) 2 % Apply topically 2 (two) times daily.   pantoprazole (PROTONIX) 40 MG tablet TAKE 1 TABLET BY MOUTH TWICE A DAY   Polyvinyl Alcohol-Povidone (REFRESH OP) Place 1 drop into both eyes daily as needed (dry eyes).   rosuvastatin (CRESTOR) 10 MG tablet Take 1 tablet (10 mg total) by mouth daily.   sodium chloride (OCEAN) 0.65 % SOLN nasal spray Place 1 spray into both nostrils as needed for congestion.   sucralfate (CARAFATE) 1 GM/10ML suspension TAKE TEN MILLILITERS BY MOUTH FOUR TIMES A DAY WITH MEALS AND AT BEDTIME   zolpidem (AMBIEN) 10 MG tablet TAKE ONE TABLET BY MOUTH EVERY NIGHT AT BEDTIME AS NEEDED FOR SLEEP   [DISCONTINUED] cholestyramine (QUESTRAN) 4 g packet Take 0.5 packets (2 g total) by mouth 2 (two) times daily as needed.    Allergies: Patient is allergic to aspirin, nsaids, penicillins, yellow hornet venom [hornet venom], and lisinopril. Family History: Patient family history includes Healthy in her son and son; Heart attack in her father; Heart disease in her father; High blood pressure in her brother; Lung  cancer in her mother. Social History:  Patient  reports that she has never smoked. She has never used smokeless tobacco. She reports current alcohol use. She reports that she does not use drugs.  Review of Systems: Constitutional: Negative for fever malaise or anorexia Cardiovascular: negative for chest pain Respiratory: negative for SOB or persistent cough Gastrointestinal: negative for abdominal pain  Objective  Vitals: BP 110/80   Pulse 67   Temp 97.8 F (36.6 C)   Ht '5\' 5"'$  (1.651 m)   Wt 138 lb 12.8 oz (63 kg)   SpO2 98%   BMI 23.10 kg/m  General: no acute distress , A&Ox3 HEENT: PEERL, conjunctiva normal, neck is supple, nasal mucosa is erythematous and boggy with some drainage  present, minimal sinus tenderness bilaterally Cardiovascular:  RRR without murmur or gallop.  Respiratory:  Good breath sounds bilaterally, CTAB with normal respiratory effort Skin:  Warm, no rashes  Commons side effects, risks, benefits, and alternatives for medications and treatment plan prescribed today were discussed, and the patient expressed understanding of the given instructions. Patient is instructed to call or message via MyChart if he/she has any questions or concerns regarding our treatment plan. No barriers to understanding were identified. We discussed Red Flag symptoms and signs in detail. Patient expressed understanding regarding what to do in case of urgent or emergency type symptoms.  Medication list was reconciled, printed and provided to the patient in AVS. Patient instructions and summary information was reviewed with the patient as documented in the AVS. This note was prepared with assistance of Dragon voice recognition software. Occasional wrong-word or sound-a-like substitutions may have occurred due to the inherent limitations of voice recognition software

## 2022-10-20 NOTE — Progress Notes (Signed)
[facility] PREP Weekly Session  Patient Details  Name: Tina Kelley MRN: [id number] Date of Birth: [birth year] Age: 72 y.o. PCP: Leamon Arnt, MD  Vitals:   10/20/22 1115  Weight: 139 lb 1.6 oz (63.1 kg)     [facility] Weekly seesion - 10/20/22 1100       [facility] "PREP" Location   [facility] "PREP" Location [name]      Weekly Session   Topic Discussed Expectations and non-scale victories   Staying positive; halfway thru program, review/revisit/restate goals   Minutes exercised this week 210 minutes    Classes attended to date [place] 10/20/2022, 11:16 AM

## 2022-10-27 NOTE — Progress Notes (Signed)
[facility] PREP Weekly Session  Patient Details  Name: Tina Kelley MRN: [id number] Date of Birth: September 13, 1951 Age: 72 y.o. PCP: Leamon Arnt, MD  Vitals:   10/27/22 1132  Weight: 137 lb 8 oz (62.4 kg)     [facility] Weekly seesion - 10/27/22 1100       [facility] "PREP" Location   [facility] "PREP" Location [name]      Weekly Session   Minutes exercised this week 85 minutes    Classes attended to date [place] 10/27/2022, 11:32 AM

## 2022-11-01 ENCOUNTER — Other Ambulatory Visit: Payer: Self-pay | Admitting: Internal Medicine

## 2022-11-03 NOTE — Progress Notes (Signed)
[facility] PREP Weekly Session  Patient Details  Name: [name] MRN: [id number] Date of Birth: June 01, 1951 Age: 72 y.o. PCP: Leamon Arnt, MD  Vitals:   11/03/22 1139  Weight: 137 lb 6.4 oz (62.3 kg)     [facility] Weekly seesion - 11/03/22 1100       [facility] "PREP" Location   [facility] "PREP" Location [name]      Weekly Session   Topic Discussed Finding support   review of labels from foods brought in by PREP participants   Minutes exercised this week 145 minutes    Classes attended to date [place] 11/03/2022, 11:44 AM

## 2022-11-09 ENCOUNTER — Ambulatory Visit (INDEPENDENT_AMBULATORY_CARE_PROVIDER_SITE_OTHER): Payer: Medicare PPO

## 2022-11-09 DIAGNOSIS — Z0000 Encounter for general adult medical examination without abnormal findings: Secondary | ICD-10-CM

## 2022-11-09 NOTE — Patient Instructions (Signed)
Tina Kelley , Thank you for taking time to come for your Medicare Wellness Visit. I appreciate your ongoing commitment to your health goals. Please review the following plan we discussed and let me know if I can assist you in the future.   These are the goals we discussed:  Goals      Patient Stated     Maintain health     Patient Stated     None at this time     Patient Stated     [facility]         This is a list of the screening recommended for you and due dates:  Health Maintenance  Topic Date Due   DTaP/Tdap/Td vaccine (2 - Td or Tdap) 01/07/2021   COVID-19 Vaccine (6 - 2023-24 season) 05/29/2022   Mammogram  05/19/2023   DEXA scan (bone density measurement)  06/10/2023   Medicare Annual Wellness Visit  11/10/2023   Colon Cancer Screening  10/26/2026   Pneumonia Vaccine  Completed   Flu Shot  Completed   Hepatitis C Screening: USPSTF Recommendation to screen - Ages 18-79 yo.  Completed   Zoster (Shingles) Vaccine  Completed   HPV Vaccine  Aged Out    Advanced directives: copies in chart   Conditions/risks identified: [facility]  Next appointment: Follow up in one year for your annual wellness visit    Preventive Care 65 Years and Older, Female Preventive care refers to lifestyle choices and visits with your health care provider that can promote health and wellness. What does preventive care include? A yearly physical exam. This is also called an annual well check. Dental exams once or twice a year. Routine eye exams. Ask your health care provider how often you should have your eyes checked. Personal lifestyle choices, including: Daily care of your teeth and gums. Regular physical activity. Eating a healthy diet. Avoiding tobacco and drug use. Limiting alcohol use. Practicing safe sex. Taking low-dose aspirin every day. Taking vitamin and mineral supplements as recommended by your health care provider. What happens during an annual  well check? The services and screenings done by your health care provider during your annual well check will depend on your age, overall health, lifestyle risk factors, and family history of disease. Counseling  Your health care provider may ask you questions about your: Alcohol use. Tobacco use. Drug use. Emotional well-being. Home and relationship well-being. Sexual activity. Eating habits. History of falls. Memory and ability to understand (cognition). Work and work Statistician. Reproductive health. Screening  You may have the following tests or measurements: Height, weight, and BMI. Blood pressure. Lipid and cholesterol levels. These may be checked every 5 years, or more frequently if you are over 56 years old. Skin check. Lung cancer screening. You may have this screening every year starting at age 45 if you have a 30-pack-year history of smoking and currently smoke or have quit within the past 15 years. Fecal occult blood test (FOBT) of the stool. You may have this test every year starting at age 28. Flexible sigmoidoscopy or colonoscopy. You may have a sigmoidoscopy every 5 years or a colonoscopy every 10 years starting at age 56. Hepatitis C blood test. Hepatitis B blood test. Sexually transmitted disease (STD) testing. Diabetes screening. This is done by checking your blood sugar (glucose) after you have not eaten for a while (fasting). You may have this done every 1-3 years. Bone density scan. This is done to screen  for osteoporosis. You may have this done starting at age 5. Mammogram. This may be done every 1-2 years. Talk to your health care provider about how often you should have regular mammograms. Talk with your health care provider about your test results, treatment options, and if necessary, the need for more tests. Vaccines  Your health care provider may recommend certain vaccines, such as: Influenza vaccine. This is recommended every year. Tetanus, diphtheria,  and acellular pertussis (Tdap, Td) vaccine. You may need a Td booster every 10 years. Zoster vaccine. You may need this after age 15. Pneumococcal 13-valent conjugate (PCV13) vaccine. One dose is recommended after age 44. Pneumococcal polysaccharide (PPSV23) vaccine. One dose is recommended after age 9. Talk to your health care provider about which screenings and vaccines you need and how often you need them. This information is not intended to replace advice given to you by your health care provider. Make sure you discuss any questions you have with your health care provider. Document Released: 10/11/2015 Document Revised: 06/03/2016 Document Reviewed: 07/16/2015 Elsevier Interactive Patient Education  2017 [place] Prevention in the Home Falls can cause injuries. They can happen to people of all ages. There are many things you can do to make your home safe and to help prevent falls. What can I do on the outside of my home? Regularly fix the edges of walkways and driveways and fix any cracks. Remove anything that might make you trip as you walk through a door, such as a raised step or threshold. Trim any bushes or trees on the path to your home. Use bright outdoor lighting. Clear any walking paths of anything that might make someone trip, such as rocks or tools. Regularly check to see if handrails are loose or broken. Make sure that both sides of any steps have handrails. Any raised decks and porches should have guardrails on the edges. Have any leaves, snow, or ice cleared regularly. Use sand or salt on walking paths during winter. Clean up any spills in your garage right away. This includes oil or grease spills. What can I do in the bathroom? Use night lights. Install grab bars by the toilet and in the tub and shower. Do not use towel bars as grab bars. Use non-skid mats or decals in the tub or shower. If you need to sit down in the shower, use a plastic, non-slip  stool. Keep the floor dry. Clean up any water that spills on the floor as soon as it happens. Remove soap buildup in the tub or shower regularly. Attach bath mats securely with double-sided non-slip rug tape. Do not have throw rugs and other things on the floor that can make you trip. What can I do in the bedroom? Use night lights. Make sure that you have a light by your bed that is easy to reach. Do not use any sheets or blankets that are too big for your bed. They should not hang down onto the floor. Have a firm chair that has side arms. You can use this for support while you get dressed. Do not have throw rugs and other things on the floor that can make you trip. What can I do in the kitchen? Clean up any spills right away. Avoid walking on wet floors. Keep items that you use a lot in easy-to-reach places. If you need to reach something above you, use a strong step stool that has a grab bar. Keep electrical cords out of the way. Do  not use floor polish or wax that makes floors slippery. If you must use wax, use non-skid floor wax. Do not have throw rugs and other things on the floor that can make you trip. What can I do with my stairs? Do not leave any items on the stairs. Make sure that there are handrails on both sides of the stairs and use them. Fix handrails that are broken or loose. Make sure that handrails are as long as the stairways. Check any carpeting to make sure that it is firmly attached to the stairs. Fix any carpet that is loose or worn. Avoid having throw rugs at the top or bottom of the stairs. If you do have throw rugs, attach them to the floor with carpet tape. Make sure that you have a light switch at the top of the stairs and the bottom of the stairs. If you do not have them, ask someone to add them for you. What else can I do to help prevent falls? Wear shoes that: Do not have high heels. Have rubber bottoms. Are comfortable and fit you well. Are closed at the  toe. Do not wear sandals. If you use a stepladder: Make sure that it is fully opened. Do not climb a closed stepladder. Make sure that both sides of the stepladder are locked into place. Ask someone to hold it for you, if possible. Clearly mark and make sure that you can see: Any grab bars or handrails. First and last steps. Where the edge of each step is. Use tools that help you move around (mobility aids) if they are needed. These include: Canes. Walkers. Scooters. Crutches. Turn on the lights when you go into a dark area. Replace any light bulbs as soon as they burn out. Set up your furniture so you have a clear path. Avoid moving your furniture around. If any of your floors are uneven, fix them. If there are any pets around you, be aware of where they are. Review your medicines with your doctor. Some medicines can make you feel dizzy. This can increase your chance of falling. Ask your doctor what other things that you can do to help prevent falls. This information is not intended to replace advice given to you by your health care provider. Make sure you discuss any questions you have with your health care provider. Document Released: 07/11/2009 Document Revised: 02/20/2016 Document Reviewed: 10/19/2014 Elsevier Interactive Patient Education  2017 [employer].

## 2022-11-09 NOTE — Progress Notes (Signed)
I connected with  Tina Kelley on 11/09/22 by a audio enabled telemedicine application and verified that I am speaking with the correct person using two identifiers.  Patient Location: Home  Provider Location: Office/Clinic  I discussed the limitations of evaluation and management by telemedicine. The patient expressed understanding and agreed to proceed.  Patient Medicare AWV questionnaire was completed by the patient on 11/05/22; I have confirmed that all information answered by patient is correct and no changes since this date.          Subjective:   Tina Kelley is a 72 y.o. female who presents for Medicare Annual (Subsequent) preventive examination.  Review of Systems     Cardiac Risk Factors include: advanced age (>70mn, >>49women);dyslipidemia;hypertension     Objective:    There were no vitals filed for this visit. There is no height or weight on file to calculate BMI.     11/09/2022    8:39 AM 12/08/2021    2:25 PM 12/04/2021    9:55 AM 10/27/2021    8:45 AM 09/12/2020    3:24 PM 06/12/2020    2:33 PM 06/10/2020    9:41 AM  Advanced Directives  Does Patient Have a Medical Advance Directive? Yes Yes Yes Yes Yes No No  Type of AParamedicof [place] will Healthcare Power of [place] will Healthcare Power of [place] will    Does patient want to make changes to medical advance directive? No - Patient declined No - Patient declined No - Patient declined      Copy of HHartfordin Chart? Yes - validated most recent copy scanned in chart (See row information) No - copy requested No - copy requested Yes - validated most recent copy scanned in chart (See row information) No - copy requested    Would patient like information on creating a medical advance directive?      No - Patient declined No - Patient declined    Current Medications (verified) Outpatient  Encounter Medications as of 11/09/2022  Medication Sig   acetaminophen (TYLENOL) 500 MG tablet Take 500 mg by mouth every 6 (six) hours as needed for moderate pain.   albuterol (VENTOLIN HFA) 108 (90 Base) MCG/ACT inhaler INHALE 2 PUFFS INTO THE LUNGS EVERY 6 HOURS AS NEEDED FOR WHEEZING OR SHORTNESS OF BREATH   ALPRAZolam (XANAX) 0.5 MG tablet Take 1 tablet (0.5 mg total) by mouth daily as needed for anxiety.   buPROPion (WELLBUTRIN SR) 150 MG 12 hr tablet TAKE ONE TABLET BY MOUTH TWICE A DAY   cholestyramine (QUESTRAN) 4 g packet Take 0.5 packets (2 g total) by mouth 2 (two) times daily as needed.   EPINEPHrine (EPIPEN 2-PAK) 0.3 mg/0.3 mL IJ SOAJ injection Inject 0.3 mg into the muscle as needed for anaphylaxis. Use as instructed.   hydrocortisone (ANUSOL-HC) 25 MG suppository Place 1 suppository (25 mg total) rectally at bedtime as needed for hemorrhoids.   Lactobacillus Rhamnosus, GG, (CULTURELLE) CAPS Take 1 capsule by mouth daily.   losartan (COZAAR) 50 MG tablet Take 0.5 tablets (25 mg total) by mouth in the morning and at bedtime.   metoprolol succinate (TOPROL-XL) 50 MG 24 hr tablet Take 1 tablet (50 mg total) by mouth daily. Take with or immediately following a meal.   mupirocin ointment (BACTROBAN) 2 % Apply topically 2 (two) times daily.   pantoprazole (PROTONIX) 40 MG tablet TAKE 1 TABLET BY MOUTH TWICE A  DAY   Polyvinyl Alcohol-Povidone (REFRESH OP) Place 1 drop into both eyes daily as needed (dry eyes).   RESTASIS 0.05 % ophthalmic emulsion    rosuvastatin (CRESTOR) 10 MG tablet Take 1 tablet (10 mg total) by mouth daily.   sodium chloride (OCEAN) 0.65 % SOLN nasal spray Place 1 spray into both nostrils as needed for congestion.   zolpidem (AMBIEN) 10 MG tablet TAKE ONE TABLET BY MOUTH EVERY NIGHT AT BEDTIME AS NEEDED FOR SLEEP   [DISCONTINUED] sucralfate (CARAFATE) 1 GM/10ML suspension TAKE TEN MILLILITERS BY MOUTH FOUR TIMES A DAY WITH MEALS AND AT BEDTIME   No  facility-administered encounter medications on file as of 11/09/2022.    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 Chronic PPI   Mild intermittent asthma without complication 123XX123   Postmenopausal atrophic vaginitis 03/13/2013   Primary insomnia 11/16/2017   Seasonal allergic rhinitis due to pollen 03/29/2019   Past Surgical History:  Procedure Laterality Date   CHOLECYSTECTOMY     COLONOSCOPY     x2   NASAL SINUS SURGERY     THYROIDECTOMY Right 12/08/2021   Procedure: THYROID LOBECTOMY;  Surgeon: [name], MD;  Location: [facility];  Service: ENT;  Laterality: Right;   UPPER GASTROINTESTINAL ENDOSCOPY     Family History  Problem Relation Age of Onset   Lung cancer Mother    Heart disease Father    Heart attack Father    High blood pressure Brother    Healthy Son    Healthy Son    Colon cancer Neg Hx    Esophageal cancer Neg Hx    Rectal cancer Neg Hx    Stomach cancer Neg Hx    Pancreatic cancer Neg Hx    Social History   Socioeconomic History   Marital status: Widowed    Spouse name: Not on file   Number of children: 2   Years of education: Not on file   Highest education level: Not on file  Occupational History   Occupation: retired Pharmacist, hospital  Tobacco Use   Smoking status: Never   Smokeless tobacco: Never  Vaping Use   Vaping Use: Never used  Substance and Sexual Activity   Alcohol use: Yes    Comment: a glass of wine a couple times per week   Drug use: Never   Sexual activity: Yes    Birth  control/protection: Post-menopausal  Other Topics Concern   Not on file  Social History Narrative   Not on file   Social Determinants of Health   Financial Resource Strain: Low Risk  (10/27/2021)   Overall Financial Resource Strain (CARDIA)    Difficulty of Paying Living Expenses: Not hard at all  Food Insecurity: No Food Insecurity (11/05/2022)   Hunger Vital Sign    Worried About Running Out of Food in the Last Year: Never true    [place] in the Last Year: Never true  Transportation Needs: No Transportation Needs (11/05/2022)   PRAPARE - Hydrologist (Medical): No  Lack of Transportation (Non-Medical): No  Physical Activity: Insufficiently Active (11/05/2022)   Exercise Vital Sign    Days of Exercise per Week: 4 days    Minutes of Exercise per Session: 30 min  Stress: Stress Concern Present (11/09/2022)   [place]    Feeling of Stress : Rather much  Social Connections: Moderately Integrated (11/05/2022)   Social Connection and Isolation Panel [NHANES]    Frequency of Communication with Friends and Family: More than three times a week    Frequency of Social Gatherings with Friends and Family: Twice a week    Attends Religious Services: 1 to 4 times per year    Active Member of [employer] or Organizations: Yes    Attends Archivist Meetings: More than 4 times per year    Marital Status: Widowed    Tobacco Counseling Counseling given: Not Answered   Clinical Intake:  Pre-visit preparation completed: Yes  Pain : No/denies pain     BMI - recorded: 22.86 Nutritional Status: BMI 25 -29 Overweight Nutritional Risks: None Diabetes: No  How often do you need to have someone help you when you read instructions, pamphlets, or other written materials from your doctor or pharmacy?: 1 - Never  Diabetic?no  Interpreter Needed?: No  Information entered by :: Charlott Rakes,  LPN   Activities of Daily Living    11/05/2022    8:50 AM 12/08/2021    2:25 PM  In your present state of health, do you have any difficulty performing the following activities:  Hearing? 0 0  Vision? 0 0  Difficulty concentrating or making decisions? 0 0  Walking or climbing stairs? 0 0  Dressing or bathing? 0 0  Doing errands, shopping? 0 0  Preparing Food and eating ? N   Using the Toilet? N   In the past six months, have you accidently leaked urine? N   Do you have problems with loss of bowel control? N   Managing your Medications? N   Managing your Finances? N   Housekeeping or managing your Housekeeping? N     Patient Care Team: Leamon Arnt, MD as PCP - General (Family Medicine) Buford Dresser, MD as PCP - Cardiology (Cardiology) Lenon Oms, MD as Consulting Physician (Obstetrics and Gynecology) Randye Lobo, Londell Moh, FNP as Consulting Physician (Dermatology) Trula Slade, DPM as Consulting Physician (Podiatry) Pyrtle, Lajuan Lines, MD as Consulting Physician (Gastroenterology) [name], MD as Consulting Physician (Otolaryngology)  Indicate any recent Medical Services you may have received from other than [facility] providers in the past year (date may be approximate).     Assessment:   This is a routine wellness examination for [place].  Hearing/Vision screen Hearing Screening - Comments:: Pt stated slight loss  Vision Screening - Comments:: Pt follows up with Dr Laban Emperor for annual eye exams   Dietary issues and exercise activities discussed: Current Exercise Habits: Home exercise routine, Type of exercise: Other - see comments, Time (Minutes): 60, Frequency (Times/Week): 3, Weekly Exercise (Minutes/Week): 180   Goals Addressed             This Visit's Progress    Patient Stated       [facility]        Depression Screen    11/09/2022    8:37 AM 10/19/2022   11:18 AM 05/06/2022    2:23 PM 11/28/2021    9:09 AM 10/27/2021    8:44 AM  11/25/2020    8:57 AM 09/12/2020    3:23 PM  PHQ 2/9 Scores  PHQ - 2 Score 0 0 0 1 0 0 0  PHQ- 9 Score    3       Fall Risk    11/05/2022    8:50 AM 10/19/2022   11:18 AM 05/06/2022    2:22 PM 03/30/2022    9:20 AM 10/27/2021    8:46 AM  Fall Risk   Falls in the past year? 0 0 0 0 0  Number falls in past yr: 0 0 0 0 0  Injury with Fall? 0 0 0 0 0  Risk for fall due to :   No Fall Risks No Fall Risks Impaired vision  Follow up  Falls evaluation completed Falls evaluation completed Falls evaluation completed Falls prevention discussed    FALL RISK PREVENTION PERTAINING TO THE HOME:  Any stairs in or around the home? Yes  If so, are there any without handrails? No  Home free of loose throw rugs in walkways, pet beds, electrical cords, etc? Yes  Adequate lighting in your home to reduce risk of falls? Yes   ASSISTIVE DEVICES UTILIZED TO PREVENT FALLS:  Life alert? No  Use of a cane, walker or w/c? No  Grab bars in the bathroom? Yes  Shower chair or bench in shower? Yes  Elevated toilet seat or a handicapped toilet? Yes   TIMED UP AND GO:  Was the test performed? No .   Cognitive Function:    08/07/2019    2:31 PM  MMSE - Mini Mental State Exam  Orientation to time 5  Orientation to Place 5  Registration 3  Attention/ Calculation 5  Recall 3  Language- name 2 objects 2  Language- repeat 1  Language- follow 3 step command 3  Language- read & follow direction 1  Write a sentence 1  Copy design 1  Total score 30        11/09/2022    8:40 AM 10/27/2021    8:47 AM 09/12/2020    3:28 PM  6CIT Screen  What Year? 0 points 0 points 0 points  What month? 0 points 0 points 0 points  What time? 0 points 0 points   Count back from 20 0 points 0 points 0 points  Months in reverse 0 points 0 points 0 points  Repeat phrase 0 points 0 points 0 points  Total Score 0 points 0 points     Immunizations Immunization History  Administered Date(s) Administered   Fluad Quad(high  Dose 65+) 06/19/2019, 06/11/2020, 06/16/2021, 06/16/2022   Influenza Split 08/30/2012, 08/18/2013, 07/18/2014   Influenza, High Dose Seasonal PF 06/19/2019, 06/11/2020   Influenza, Seasonal, Injecte, Preservative Fre 07/16/2011   Influenza,inj,quad, With Preservative 07/01/2015, 06/30/2016, 06/28/2018   Moderna Covid-19 Vaccine Bivalent Booster 57yr & up 07/24/2021   Moderna Sars-Covid-2 Vaccination 10/28/2019, 11/30/2019, 08/05/2020, 02/06/2021   Pneumococcal Conjugate-13 10/24/2014   Pneumococcal Polysaccharide-23 10/29/2015   Tdap 01/08/2011   Zoster Recombinat (Shingrix) 12/03/2020, 04/10/2021    TDAP status: Due, Education has been provided regarding the importance of this vaccine. Advised may receive this vaccine at local pharmacy or Health Dept. Aware to provide a copy of the vaccination record if obtained from local pharmacy or Health Dept. Verbalized acceptance and understanding.  Flu Vaccine status: Up to date  Pneumococcal vaccine status: Up to date  Covid-19 vaccine status: Completed vaccines  Qualifies for Shingles Vaccine? Yes   Zostavax completed Yes  Shingrix Completed?: Yes  Screening Tests Health Maintenance  Topic Date Due   DTaP/Tdap/Td (2 - Td or Tdap) 01/07/2021   COVID-19 Vaccine (6 - 2023-24 season) 05/29/2022   MAMMOGRAM  05/19/2023   DEXA SCAN  06/10/2023   Medicare Annual Wellness (AWV)  11/10/2023   COLONOSCOPY (Pts 45-33yr Insurance coverage will need to be confirmed)  10/26/2026   Pneumonia Vaccine 72 Years old  Completed   INFLUENZA VACCINE  Completed   Hepatitis C Screening  Completed   Zoster Vaccines- Shingrix  Completed   HPV VACCINES  Aged Out    Health Maintenance  Health Maintenance Due  Topic Date Due   DTaP/Tdap/Td (2 - Td or Tdap) 01/07/2021   COVID-19 Vaccine (6 - 2023-24 season) 05/29/2022    Colorectal cancer screening: Type of screening: Colonoscopy. Completed 10/27/19. Repeat every 7 years  Mammogram status: Completed  05/18/22. Repeat every year  Bone Density status: Completed 06/09/21. Results reflect: Bone density results: NORMAL. Repeat every 2 years.   Additional Screening:  Hepatitis C Screening:  Completed 01/08/11  Vision Screening: Recommended annual ophthalmology exams for early detection of glaucoma and other disorders of the eye. Is the patient up to date with their annual eye exam?  Yes  Who is the provider or what is the name of the office in which the patient attends annual eye exams? Dr HLaban Emperor If pt is not established with a provider, would they like to be referred to a provider to establish care? No .   Dental Screening: Recommended annual dental exams for proper oral hygiene  Community Resource Referral / Chronic Care Management: CRR required this visit?  No   CCM required this visit?  No      Plan:     I have personally reviewed and noted the following in the patient's chart:   Medical and social history Use of alcohol, tobacco or illicit drugs  Current medications and supplements including opioid prescriptions. Patient is not currently taking opioid prescriptions. Functional ability and status Nutritional status Physical activity Advanced directives List of other physicians Hospitalizations, surgeries, and ER visits in previous 12 months Vitals Screenings to include cognitive, depression, and falls Referrals and appointments  In addition, I have reviewed and discussed with patient certain preventive protocols, quality metrics, and best practice recommendations. A written personalized care plan for preventive services as well as general preventive health recommendations were provided to patient.     TWillette Brace LPN   [id number]  Nurse Notes: none

## 2022-11-09 NOTE — Progress Notes (Unsigned)
    Tina Kelley D.Tina Kelley Phone: [phone]   Assessment and Plan:     There are no diagnoses linked to this encounter.  ***   Pertinent previous records reviewed include ***   Follow Up: ***     Subjective:   I, Tina Kelley, am serving as a Education administrator for Doctor Glennon Mac  Chief Complaint: back pain   HPI:   11/10/2022 Patient is a 72 year old female complaining of back pain. Patient states  Relevant Historical Information: ***  Additional pertinent review of systems negative.   Current Outpatient Medications:    acetaminophen (TYLENOL) 500 MG tablet, Take 500 mg by mouth every 6 (six) hours as needed for moderate pain., Disp: , Rfl:    albuterol (VENTOLIN HFA) 108 (90 Base) MCG/ACT inhaler, INHALE 2 PUFFS INTO THE LUNGS EVERY 6 HOURS AS NEEDED FOR WHEEZING OR SHORTNESS OF BREATH, Disp: 18 g, Rfl: 1   ALPRAZolam (XANAX) 0.5 MG tablet, Take 1 tablet (0.5 mg total) by mouth daily as needed for anxiety., Disp: 30 tablet, Rfl: 0   buPROPion (WELLBUTRIN SR) 150 MG 12 hr tablet, TAKE ONE TABLET BY MOUTH TWICE A DAY, Disp: 180 tablet, Rfl: 3   cholestyramine (QUESTRAN) 4 g packet, Take 0.5 packets (2 g total) by mouth 2 (two) times daily as needed., Disp: 60 each, Rfl: 11   EPINEPHrine (EPIPEN 2-PAK) 0.3 mg/0.3 mL IJ SOAJ injection, Inject 0.3 mg into the muscle as needed for anaphylaxis. Use as instructed., Disp: 1 each, Rfl: 1   hydrocortisone (ANUSOL-HC) 25 MG suppository, Place 1 suppository (25 mg total) rectally at bedtime as needed for hemorrhoids., Disp: 12 suppository, Rfl: 5   Lactobacillus Rhamnosus, GG, (CULTURELLE) CAPS, Take 1 capsule by mouth daily., Disp: , Rfl:    losartan (COZAAR) 50 MG tablet, Take 0.5 tablets (25 mg total) by mouth in the morning and at bedtime., Disp: 90 tablet, Rfl: 3   metoprolol succinate (TOPROL-XL) 50 MG 24 hr tablet, Take 1 tablet (50 mg total) by mouth  daily. Take with or immediately following a meal., Disp: 90 tablet, Rfl: 3   mupirocin ointment (BACTROBAN) 2 %, Apply topically 2 (two) times daily., Disp: 15 g, Rfl: 0   pantoprazole (PROTONIX) 40 MG tablet, TAKE 1 TABLET BY MOUTH TWICE A DAY, Disp: 180 tablet, Rfl: 0   Polyvinyl Alcohol-Povidone (REFRESH OP), Place 1 drop into both eyes daily as needed (dry eyes)., Disp: , Rfl:    RESTASIS 0.05 % ophthalmic emulsion, , Disp: , Rfl:    rosuvastatin (CRESTOR) 10 MG tablet, Take 1 tablet (10 mg total) by mouth daily., Disp: 90 tablet, Rfl: 3   sodium chloride (OCEAN) 0.65 % SOLN nasal spray, Place 1 spray into both nostrils as needed for congestion., Disp: , Rfl:    zolpidem (AMBIEN) 10 MG tablet, TAKE ONE TABLET BY MOUTH EVERY NIGHT AT BEDTIME AS NEEDED FOR SLEEP, Disp: 30 tablet, Rfl: 5   Objective:     There were no vitals filed for this visit.    There is no height or weight on file to calculate BMI.    Physical Exam:    ***   Electronically signed by:  Tina Kelley D.[facility] 4:41 PM 11/09/22

## 2022-11-10 ENCOUNTER — Ambulatory Visit: Payer: Medicare PPO | Admitting: Sports Medicine

## 2022-11-10 ENCOUNTER — Ambulatory Visit (INDEPENDENT_AMBULATORY_CARE_PROVIDER_SITE_OTHER): Payer: Medicare PPO

## 2022-11-10 VITALS — BP 122/84 | Ht 65.0 in | Wt 140.0 lb

## 2022-11-10 DIAGNOSIS — M542 Cervicalgia: Secondary | ICD-10-CM | POA: Diagnosis not present

## 2022-11-10 DIAGNOSIS — S29012A Strain of muscle and tendon of back wall of thorax, initial encounter: Secondary | ICD-10-CM

## 2022-11-10 DIAGNOSIS — M546 Pain in thoracic spine: Secondary | ICD-10-CM

## 2022-11-10 DIAGNOSIS — G8929 Other chronic pain: Secondary | ICD-10-CM | POA: Diagnosis not present

## 2022-11-10 DIAGNOSIS — M40204 Unspecified kyphosis, thoracic region: Secondary | ICD-10-CM | POA: Diagnosis not present

## 2022-11-10 MED ORDER — METHYLPREDNISOLONE 4 MG PO TBPK: ORAL_TABLET | ORAL | 0 refills | Status: DC

## 2022-11-10 NOTE — Progress Notes (Signed)
[facility] PREP Weekly Session  Patient Details  Name: Tina Kelley MRN: [id number] Date of Birth: 06-21-1951 Age: 72 y.o. PCP: Leamon Arnt, MD  Vitals:   11/10/22 1133  Weight: 137 lb (62.1 kg)     [facility] Weekly seesion - 11/10/22 1100       [facility] "PREP" Location   [facility] "PREP" Location [name]      Weekly Session   Topic Discussed Calorie breakdown   Carbs (simple vs. complex), fats, proteins; quality of the calorie   Minutes exercised this week 220 minutes    Classes attended to date [place] 11/10/2022, 11:34 AM

## 2022-11-10 NOTE — Patient Instructions (Addendum)
Good to see you  Thoracic HEP  Prednisone dos pak  PT referral  4 week follow up

## 2022-11-12 ENCOUNTER — Encounter: Payer: Self-pay | Admitting: Sports Medicine

## 2022-11-12 ENCOUNTER — Other Ambulatory Visit: Payer: Self-pay | Admitting: Sports Medicine

## 2022-11-12 DIAGNOSIS — G8929 Other chronic pain: Secondary | ICD-10-CM

## 2022-11-12 DIAGNOSIS — M542 Cervicalgia: Secondary | ICD-10-CM

## 2022-11-12 DIAGNOSIS — S29012A Strain of muscle and tendon of back wall of thorax, initial encounter: Secondary | ICD-10-CM

## 2022-11-13 ENCOUNTER — Ambulatory Visit: Payer: Medicare PPO | Admitting: Podiatry

## 2022-11-17 NOTE — Progress Notes (Signed)
[facility] PREP Weekly Session  Patient Details  Name: Tina Kelley MRN: [id number] Date of Birth: 22-May-1951 Age: 72 y.o. PCP: Leamon Arnt, MD  Vitals:   11/17/22 1127  Weight: 138 lb (62.6 kg)     [facility] Weekly seesion - 11/17/22 1100       [facility] "PREP" Location   [facility] "PREP" Location [name]      Weekly Session   Topic Discussed Hitting roadblocks   Review of goals and activity plan for next 90 days after program is over; asked to complete program survery and bring both to final assessment vist next Thursday   Minutes exercised this week 190 minutes    Classes attended to date [place] 11/17/2022, 11:29 AM

## 2022-11-23 ENCOUNTER — Ambulatory Visit: Payer: Medicare PPO | Admitting: Podiatry

## 2022-11-23 ENCOUNTER — Encounter: Payer: Self-pay | Admitting: Podiatry

## 2022-11-23 DIAGNOSIS — L600 Ingrowing nail: Secondary | ICD-10-CM

## 2022-11-23 NOTE — Progress Notes (Signed)
Chief Complaint  Patient presents with   Toe Pain    Hallux left - medial border, lost nail in October 2023, now that its growing back its become very sore    HPI: 72 y.o. female presenting today for follow-up evaluation after total temporary nail avulsion to the left hallux nail plate secondary to traumatic injury.  Patient states that the nail plate is growing out nicely however she does have some pain and tenderness to the medial aspect of the nail plate along the medial nail fold.  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 Chronic PPI   Mild intermittent asthma without complication 123XX123   Postmenopausal atrophic vaginitis 03/13/2013   Primary insomnia 11/16/2017   Seasonal allergic rhinitis due to pollen 03/29/2019    Past Surgical History:  Procedure Laterality Date   CHOLECYSTECTOMY     COLONOSCOPY     x2   NASAL SINUS SURGERY     THYROIDECTOMY Right 12/08/2021   Procedure: THYROID LOBECTOMY;  Surgeon: [name], MD;  Location: [place];  Service: ENT;  Laterality: Right;   UPPER GASTROINTESTINAL ENDOSCOPY      Allergies  Allergen Reactions   Aspirin Other (See Comments)    Asthma   Nsaids Shortness Of Breath   Penicillins Diarrhea    Told as a child   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  Dermatology: Skin is warm, dry and supple bilateral lower extremities. Negative for open lesions or macerations.  There is some slight  curvature of the medial nail fold of the left hallux nail plate which appears to be slightly protruding into the medial nail fold.  There is no purulence or indication of infection.  There is some slight tenderness and sensitivity with palpation however  Vascular: Palpable pedal pulses bilaterally. Capillary refill within normal limits.  Negative for any significant edema or erythema  Neurological: Light touch and protective threshold grossly intact  Musculoskeletal Exam: No pedal deformities noted  Assessment: 1.  Mild ingrown toenail medial border left hallux nail plate -Today we discussed different treatment options both invasive which would include partial nail matricectomy versus simple debridement of the offending border of the nail plate.  For now we are going to pursue simple conservative treatment.  The offending border of the nail plate was debrided using a pair of nail nippers without incident or bleeding.  The patient actually felt significant relief with the simple debridement. -Recommend antibiotic ointment and tissue massage daily to massage the tissue away from the nail plate -Return to clinic as needed      Edrick Kins, DPM [facility]  Dr. Edrick Kins, DPM    [address]                                        [place], [place]  [place] [phone]  Fax [phone]

## 2022-11-24 NOTE — Progress Notes (Signed)
[facility] PREP Weekly Session  Patient Details  Name: Tina Kelley MRN: [id number] Date of Birth: 1951/02/04 Age: 72 y.o. PCP: Tina Arnt, MD  Vitals:   11/24/22 1153  Weight: 138 lb 1.6 oz (62.6 kg)     [facility] Weekly seesion - 11/24/22 1100       [facility] "PREP" Location   [facility] "PREP" Location Tina Kelley      Weekly Session   Topic Discussed Other   How fit and strong survey completed; fit testing completed: final assessment visits scheduled for Thursday; review of goal and activity plan and PREP survey to bring on Thursday; able to practice yoga with fruit bowl meditation '@end'$  of class   Minutes exercised this week 230 minutes    Classes attended to date [name] 11/24/2022, 11:54 AM

## 2022-11-26 NOTE — Progress Notes (Signed)
[facility] PREP Evaluation  Patient Details  Name: Tina Kelley MRN: [id number] Date of Birth: May 10, 1951 Age: 72 y.o. PCP: Leamon Arnt, MD  Vitals:   11/26/22 0949  BP: 120/70  Pulse: 77  SpO2: 98%  Weight: 138 lb 1.6 oz (62.6 kg)     [facility] Eval - 11/26/22 0900       [facility] "PREP" Location   [facility] "PRE[facility]" Location [name]      Referral    Referring Provider Walker    Program Start Date 09/08/22    Program End Date 11/26/22      Measurement   Waist Circumference 35 inches    Waist Circumference End Program 35 inches    Hip Circumference 41 inches    Hip Circumference End Program 40 inches    Body fat 17.5 percent      Mobility and Daily Activities   I find it easy to walk up or down two or more flights of stairs. 2    I have no trouble taking out the trash. 4    I do housework such as vacuuming and dusting on my own without difficulty. 4    I can easily lift a gallon of milk (8lbs). 4    I can easily walk a mile. 3    I have no trouble reaching into high cupboards or reaching down to pick up something from the floor. 2    I do not have trouble doing out-door work such as Armed forces logistics/support/administrative officer, raking leaves, or gardening. 1      Mobility and Daily Activities   I feel younger than my age. 2    I feel independent. 4    I feel energetic. 2    I live an active life.  2    I feel strong. 2    I feel healthy. 3    I feel active as other people my age. 2      How fit and strong are you.   Fit and Strong Total Score 37            Past Medical History:  Diagnosis Date   ACE-inhibitor cough 11/25/2020   Allergy    seasonal   Arthritis    Basal cell carcinoma of skin 02/29/2012   basal cell carcinoma on her left melolabial fold approximately 30 years  ago   Diverticulitis    Essential hypertension 03/29/2019   GERD (gastroesophageal reflux disease) 03/29/2019   Chronic, normal EGD. Chronic PPI   Mild intermittent asthma without complication [id number]    Postmenopausal atrophic vaginitis 03/13/2013   Primary insomnia 11/16/2017   Seasonal allergic rhinitis due to pollen 03/29/2019   Past Surgical History:  Procedure Laterality Date   CHOLECYSTECTOMY     COLONOSCOPY     x2   NASAL SINUS SURGERY     THYROIDECTOMY Right 12/08/2021   Procedure: THYROID LOBECTOMY;  Surgeon: [name], MD;  Location: [place];  Service: ENT;  Laterality: Right;   UPPER GASTROINTESTINAL ENDOSCOPY     Social History   Tobacco Use  Smoking Status Never  Smokeless Tobacco Never  Inches lost: 1 Wt. Loss: .3lbs Workout sessions completed: 9 Education sessions completed: 9  Bennett Ram B [place] Hijazi 11/26/2022, 9:52 AM

## 2022-12-02 ENCOUNTER — Ambulatory Visit (INDEPENDENT_AMBULATORY_CARE_PROVIDER_SITE_OTHER): Payer: Medicare PPO | Admitting: Family Medicine

## 2022-12-02 ENCOUNTER — Encounter: Payer: Self-pay | Admitting: Family Medicine

## 2022-12-02 VITALS — BP 120/78 | HR 70 | Temp 97.8°F | Ht 65.0 in | Wt 138.2 lb

## 2022-12-02 DIAGNOSIS — K9089 Other intestinal malabsorption: Secondary | ICD-10-CM | POA: Diagnosis not present

## 2022-12-02 DIAGNOSIS — E782 Mixed hyperlipidemia: Secondary | ICD-10-CM

## 2022-12-02 DIAGNOSIS — Z0000 Encounter for general adult medical examination without abnormal findings: Secondary | ICD-10-CM

## 2022-12-02 DIAGNOSIS — M47812 Spondylosis without myelopathy or radiculopathy, cervical region: Secondary | ICD-10-CM

## 2022-12-02 DIAGNOSIS — F5101 Primary insomnia: Secondary | ICD-10-CM | POA: Diagnosis not present

## 2022-12-02 DIAGNOSIS — M546 Pain in thoracic spine: Secondary | ICD-10-CM | POA: Diagnosis not present

## 2022-12-02 DIAGNOSIS — I10 Essential (primary) hypertension: Secondary | ICD-10-CM

## 2022-12-02 DIAGNOSIS — F411 Generalized anxiety disorder: Secondary | ICD-10-CM | POA: Diagnosis not present

## 2022-12-02 LAB — CBC WITH DIFFERENTIAL/PLATELET
Basophils Absolute: 0.1 10*3/uL (ref 0.0–0.1)
Basophils Relative: 0.6 % (ref 0.0–3.0)
Eosinophils Absolute: 0.1 10*3/uL (ref 0.0–0.7)
Eosinophils Relative: 1.2 % (ref 0.0–5.0)
HCT: 43.8 % (ref 36.0–46.0)
Hemoglobin: 14.8 g/dL (ref 12.0–15.0)
Lymphocytes Relative: 16.8 % (ref 12.0–46.0)
Lymphs Abs: 1.7 10*3/uL (ref 0.7–4.0)
MCHC: 33.8 g/dL (ref 30.0–36.0)
MCV: 96.1 fl (ref 78.0–100.0)
Monocytes Absolute: 0.6 10*3/uL (ref 0.1–1.0)
Monocytes Relative: 6.2 % (ref 3.0–12.0)
Neutro Abs: 7.7 10*3/uL (ref 1.4–7.7)
Neutrophils Relative %: 75.2 % (ref 43.0–77.0)
Platelets: 361 10*3/uL (ref 150.0–400.0)
RBC: 4.55 Mil/uL (ref 3.87–5.11)
RDW: 13.2 % (ref 11.5–15.5)
WBC: 10.2 10*3/uL (ref 4.0–10.5)

## 2022-12-02 LAB — COMPREHENSIVE METABOLIC PANEL
ALT: 23 U/L (ref 0–35)
AST: 29 U/L (ref 0–37)
Albumin: 4 g/dL (ref 3.5–5.2)
Alkaline Phosphatase: 73 U/L (ref 39–117)
BUN: 17 mg/dL (ref 6–23)
CO2: 28 mEq/L (ref 19–32)
Calcium: 9.5 mg/dL (ref 8.4–10.5)
Chloride: 101 mEq/L (ref 96–112)
Creatinine, Ser: 1.05 mg/dL (ref 0.40–1.20)
GFR: 52.85 mL/min — ABNORMAL LOW (ref >60.00)
Glucose, Bld: 107 mg/dL — ABNORMAL HIGH (ref 70–99)
Potassium: 4.6 mEq/L (ref 3.5–5.1)
Sodium: 140 mEq/L (ref 135–145)
Total Bilirubin: 0.5 mg/dL (ref 0.2–1.2)
Total Protein: 6.7 g/dL (ref 6.0–8.3)

## 2022-12-02 LAB — LIPID PANEL
Cholesterol: 224 mg/dL — ABNORMAL HIGH (ref 0–200)
HDL: 85.2 mg/dL (ref >39.00)
NonHDL: 138.75
Total CHOL/HDL Ratio: 3
Triglycerides: 201 mg/dL — ABNORMAL HIGH (ref 0.0–149.0)
VLDL: 40.2 mg/dL — ABNORMAL HIGH (ref 0.0–40.0)

## 2022-12-02 LAB — LDL CHOLESTEROL, DIRECT: Direct LDL: 108 mg/dL

## 2022-12-02 MED ORDER — ROSUVASTATIN CALCIUM 10 MG PO TABS: 10.0000 mg | ORAL_TABLET | Freq: Every day | ORAL | 3 refills | Status: DC

## 2022-12-02 NOTE — Progress Notes (Signed)
Subjective  Chief Complaint  Patient presents with   Annual Exam    Pt here for Annual exam and is currently fasting     HPI: Tina Kelley is a 72 y.o. female who presents to [place] at [place] today for a Female Wellness Visit. She also has the concerns and/or needs as listed above in the chief complaint. These will be addressed in addition to the Health Maintenance Visit.   Wellness Visit: annual visit with health maintenance review and exam without Pap  HM: screens are current. Sees gyn. Dexa due with mammo later this year. Reports osteopenia only. Chronic disease f/u and/or acute problem visit: (deemed necessary to be done in addition to the wellness visit): Globus sensation and gerd: esophageal manometry scheduled for April. Minimally improved. On protonix Thoracic back pain and neck pain: had neck xrays by ortho: 'lots of arthritis' and right thoracic back pain is muscular and secondary. Tylenol prn.  Sleep is controlled with ambien 10 w/o side effects. On crestor for HLD and fasting today for recheck. No side effects GAD is stable.  HTN is controlled. Feeling well. Taking medications w/o adverse effects. No symptoms of CHF, angina; no palpitations, sob, cp or lower extremity edema. Compliant with meds.    Assessment  1. Annual physical exam   2. Mixed hyperlipidemia   3. GAD (generalized anxiety disorder)   4. Primary insomnia   5. Essential hypertension   6. Bile salt-induced diarrhea   7. Acute right-sided thoracic back pain   8. Spondylosis of cervical 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 myelopathy or radiculopathy      Plan  Female Wellness Visit: Age appropriate Health Maintenance and Prevention measures were discussed with patient. Included topics are cancer screening recommendations, ways to keep healthy (see AVS) including dietary and exercise recommendations, regular eye and dental care, use of seat belts, and avoidance of moderate alcohol use and tobacco use.   BMI: discussed patient's BMI and encouraged positive lifestyle modifications to help get to or maintain a target BMI. HM needs and immunizations were addressed and ordered. See below for orders. See HM and immunization section for updates. Routine labs and screening tests ordered including cmp, cbc and lipids where appropriate. Discussed recommendations regarding Vit D and calcium supplementation (see AVS)  Chronic disease management visit and/or acute problem visit: HLD for recheck lipids and lfts fasting on crestor 10  HTN controlled on toprol xl 50 and losartan 50 daily. Check renal function and electrolytes. Back pain: refer for PT. Consider flexeril. Tylenol for neck pain.  Insomnia/gad and grief: all stable. Ambien 10 nighlty. Wellbutrin 150 bid for gad. Good self care discussed Diarrhea stable on meds  Follow up: 6 mo for htn  Orders Placed This Encounter  Procedures   CBC with Differential/Platelet   Comprehensive metabolic panel   Lipid panel   Ambulatory referral to Physical Therapy   Meds ordered this encounter  Medications   rosuvastatin (CRESTOR) 10 MG tablet    Sig: Take 1 tablet (10 mg total) by mouth daily.    Dispense:  90 tablet    Refill:  3      Body mass index is 23 kg/m. Wt Readings from Last 3 Encounters:  12/02/22 138 lb 3.2 oz (62.7 kg)  11/26/22 138 lb 1.6 oz (62.6 kg)  11/24/22 138 lb 1.6 oz (62.6 kg)     Patient Active Problem List   Diagnosis Date Noted   Essential hypertension 03/29/2019    Priority: High  Family history of premature CAD 03/29/2019    Priority: High   GAD (generalized anxiety disorder) 04/13/2018    Priority: High   Mixed hyperlipidemia 04/13/2018    Priority: High   Primary insomnia 11/16/2017    Priority: High   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    Priority: Medium     On questran 3x/week    Primary localized osteoarthrosis of multiple sites 09/04/2019    Priority: Medium    Mild intermittent asthma  without complication [id number]    Priority: Medium    Gastroesophageal reflux disease 10/29/2015    Priority: Medium     Chronic, normal EGD but biopsy shows esophagitis. Chronic PPI Esophageal manometry scheduled for April 2024 pyrtle    Dyspareunia 03/13/2013    Priority: Medium    Basal cell carcinoma of skin 02/29/2012    Priority: Medium     basal cell carcinoma on her left melolabial fold approximately 30 years  ago    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    Priority: Low    By bx by gyn 2022    ACE-inhibitor cough 11/25/2020    Priority: Low   Chronic allergic rhinitis 09/04/2019    Priority: Low   Dry eye syndrome 03/29/2019    Priority: Low   Postmenopausal atrophic vaginitis 03/13/2013    Priority: Low   Spondylosis of cervical 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 myelopathy or radiculopathy 12/02/2022   Globus sensation [id number]   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   Health Maintenance  Topic Date Due   DTaP/Tdap/Td (2 - Td or Tdap) 01/07/2021   COVID-19 Vaccine (6 - 2023-24 season) 12/18/2022 (Originally 05/29/2022)   MAMMOGRAM  05/19/2023   DEXA SCAN  06/10/2023   Medicare Annual Wellness (AWV)  11/10/2023   COLONOSCOPY (Pts 45-71yr Insurance coverage will need to be confirmed)  10/26/2026   Pneumonia Vaccine 72 Years old  Completed   INFLUENZA VACCINE  Completed   Hepatitis C Screening  Completed   Zoster Vaccines- Shingrix  Completed   HPV VACCINES  Aged Out   Immunization History  Administered Date(s) Administered   Fluad Quad(high Dose 65+) 06/19/2019, 06/11/2020, 06/16/2021, 06/16/2022   Influenza Split 08/30/2012, 08/18/2013, 07/18/2014   Influenza, High Dose Seasonal PF 06/19/2019, 06/11/2020   Influenza, Seasonal, Injecte, Preservative Fre 07/16/2011   Influenza,inj,quad, With Preservative 07/01/2015, 06/30/2016, 06/28/2018   Moderna Covid-19 Vaccine Bivalent Booster 146yr& up 07/24/2021   Moderna Sars-Covid-2 Vaccination  10/28/2019, 11/30/2019, 08/05/2020, 02/06/2021   Pneumococcal Conjugate-13 10/24/2014   Pneumococcal Polysaccharide-23 10/29/2015   Tdap 01/08/2011   Zoster Recombinat (Shingrix) 12/03/2020, 04/10/2021   We updated and reviewed the patient's past history in detail and it is documented below. Allergies: Patient is allergic to aspirin, nsaids, penicillins, yellow hornet venom [hornet venom], lisinopril, and clindamycin/lincomycin. Past Medical History Patient  has a past medical history of ACE-inhibitor cough (11/25/2020), Allergy, Arthritis, Basal cell carcinoma of skin (02/29/2012), Diverticulitis, Essential hypertension (03/29/2019), GERD (gastroesophageal reflux disease) (03/29/2019), Mild intermittent asthma without complication ([id number] Postmenopausal atrophic vaginitis (03/13/2013), Primary insomnia (11/16/2017), and Seasonal allergic rhinitis due to pollen (03/29/2019). Past Surgical History Patient  has a past surgical history that includes Colonoscopy; Cholecystectomy; Nasal sinus surgery; Upper gastrointestinal endoscopy; and Thyroidectomy (Right, 12/08/2021). Family History: Patient family history includes Healthy in her son and son; Heart attack in her father; Heart disease in her father; High blood pressure in her brother; Lung cancer in her mother. Social History:  Patient  reports that she has never smoked. She has  never used smokeless tobacco. She reports current alcohol use. She reports that she does not use drugs.  Review of Systems: Constitutional: negative for fever or malaise Ophthalmic: negative for photophobia, double vision or loss of vision Cardiovascular: negative for chest pain, dyspnea on exertion, or new LE swelling Respiratory: negative for SOB or persistent cough Gastrointestinal: negative for abdominal pain, change in bowel habits or melena Genitourinary: negative for dysuria or gross hematuria, no abnormal uterine bleeding or disharge Musculoskeletal:  negative for new gait disturbance or muscular weakness Integumentary: negative for new or persistent rashes, no breast lumps Neurological: negative for TIA or stroke symptoms Psychiatric: negative for SI or delusions Allergic/Immunologic: negative for hives  Patient Care Team    Relationship Specialty Notifications Start End  Leamon Arnt, MD PCP - General Family Medicine  03/29/19   Buford Dresser, MD PCP - Cardiology Cardiology  08/10/22   Lenon Oms, MD Consulting Physician Obstetrics and Gynecology  03/29/19   Randye Lobo, Londell Moh, [place] Physician Dermatology  08/07/19   Trula Slade, DPM Consulting Physician Podiatry  08/07/19   Pyrtle, Lajuan Lines, MD Consulting Physician Gastroenterology  01/23/21   [name], MD Consulting Physician Otolaryngology  11/28/21     Objective  Vitals: BP 120/78   Pulse 70   Temp 97.8 F (36.6 C)   Ht '5\' 5"'$  (1.651 m)   Wt 138 lb 3.2 oz (62.7 kg)   SpO2 99%   BMI 23.00 kg/m  General:  Well developed, well nourished, no acute distress  Psych:  Alert and orientedx3,normal mood and affect HEENT:  Normocephalic, atraumatic, non-icteric sclera,  supple neck without adenopathy, mass or thyromegaly Cardiovascular:  Normal S1, S2, RRR without gallop, rub or murmur Respiratory:  Good breath sounds bilaterally, CTAB with normal respiratory effort Gastrointestinal: normal bowel sounds, soft, non-tender, no noted masses. No HSM MSK: no deformities, contusions. Joints are without erythema or swelling.  Skin:  Warm, no rashes or suspicious lesions noted Neurologic:    Mental status is normal. Gross motor and sensory exams are normal. Normal gait. No tremor   Commons side effects, risks, benefits, and alternatives for medications and treatment plan prescribed today were discussed, and the patient expressed understanding of the given instructions. Patient is instructed to call or message via MyChart if he/she has any questions or concerns  regarding our treatment plan. No barriers to understanding were identified. We discussed Red Flag symptoms and signs in detail. Patient expressed understanding regarding what to do in case of urgent or emergency type symptoms.  Medication list was reconciled, printed and provided to the patient in AVS. Patient instructions and summary information was reviewed with the patient as documented in the AVS. This note was prepared with assistance of Dragon voice recognition software. Occasional wrong-word or sound-a-like substitutions may have occurred due to the inherent limitations of voice recognition software

## 2022-12-02 NOTE — Patient Instructions (Signed)

## 2022-12-07 MED ORDER — ROSUVASTATIN CALCIUM 10 MG PO TABS: 20.0000 mg | ORAL_TABLET | Freq: Every day | ORAL | 3 refills | Status: DC

## 2022-12-07 NOTE — Addendum Note (Signed)
Addended by: Billey Chang on: 12/07/2022 11:38 AM   Modules accepted: Orders

## 2022-12-15 ENCOUNTER — Ambulatory Visit: Payer: Medicare PPO | Admitting: Sports Medicine

## 2022-12-22 ENCOUNTER — Ambulatory Visit: Payer: Medicare PPO | Admitting: Physical Therapy

## 2022-12-22 DIAGNOSIS — M25551 Pain in right hip: Secondary | ICD-10-CM | POA: Diagnosis not present

## 2022-12-22 DIAGNOSIS — M25552 Pain in left hip: Secondary | ICD-10-CM | POA: Diagnosis not present

## 2022-12-22 DIAGNOSIS — M542 Cervicalgia: Secondary | ICD-10-CM

## 2022-12-22 DIAGNOSIS — M546 Pain in thoracic spine: Secondary | ICD-10-CM | POA: Diagnosis not present

## 2022-12-22 NOTE — Therapy (Signed)
OUTPATIENT PHYSICAL THERAPY THORACOLUMBAR EVALUATION   Patient Name: Tina Kelley MRN: [id number] DOB:06-21-1951, 72 y.o., female Today's Date: 12/22/2022  END OF SESSION:   Past Medical History:  Diagnosis Date   ACE-inhibitor cough 11/25/2020   Allergy    seasonal   Arthritis    Basal cell carcinoma of skin 02/29/2012   basal cell carcinoma on her left melolabial fold approximately 30 years  ago   Diverticulitis    Essential hypertension 03/29/2019   GERD (gastroesophageal reflux disease) 03/29/2019   Chronic, normal EGD. Chronic PPI   Mild intermittent asthma without complication [id number]   Postmenopausal atrophic vaginitis 03/13/2013   Primary insomnia 11/16/2017   Seasonal allergic rhinitis due to pollen 03/29/2019   Past Surgical History:  Procedure Laterality Date   CHOLECYSTECTOMY     COLONOSCOPY     x2   NASAL SINUS SURGERY     THYROIDECTOMY Right 12/08/2021   Procedure: THYROID LOBECTOMY;  Surgeon: [name], MD;  Location: [place];  Service: ENT;  Laterality: Right;   UPPER GASTROINTESTINAL ENDOSCOPY     Patient Active Problem List   Diagnosis Date Noted   Spondylosis of cervical 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 myelopathy or radiculopathy 12/02/2022   Globus sensation [id number]   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   ACE-inhibitor cough 11/25/2020   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   Primary localized osteoarthrosis of multiple sites 09/04/2019   Chronic allergic rhinitis 09/04/2019   Essential hypertension 03/29/2019   Mild intermittent asthma without complication [id number]   Dry eye syndrome 03/29/2019   Family history of premature CAD 03/29/2019   GAD (generalized anxiety disorder) 04/13/2018   Mixed hyperlipidemia 04/13/2018   Primary insomnia 11/16/2017   Gastroesophageal reflux disease 10/29/2015   Postmenopausal atrophic vaginitis 03/13/2013   Dyspareunia 03/13/2013   Basal cell  carcinoma of skin 02/29/2012    PCP: ***  REFERRING PROVIDER: ***  REFERRING DIAG: ***  Rationale for Evaluation and Treatment: {HABREHAB:27488}  THERAPY DIAG:  No diagnosis found.  ONSET DATE: ***  SUBJECTIVE:                                                                                                                                                                                           SUBJECTIVE STATEMENT: Husband died, can't take advil or Nsaids, tylenol does not help much. Did have prednisone, about 1 month ago, did not help overall.  Arthritis in neck, some pain Pain in back/shoulder blades, most bothersome. Better with some activity.  Also having pain in bil gr trochanters, sore , with getting up. More with standing  Walking some,   Seeing Glennon Mac   PERTINENT HISTORY:    PAIN:  Are you having pain? Yes: NPRS scale: 6/10 Pain location: thoracic  Pain description: *** Aggravating factors: Deep breath,  Relieving factors: movement   Are you having pain? Yes: NPRS scale: 5/10 Pain location: neck Pain description: *** Aggravating factors: *** Relieving factors: ***  Are you having pain? Yes: NPRS scale: 5/10 Pain location: bil hips Pain description: *** Aggravating factors: *** Relieving factors: ***  PRECAUTIONS: {Therapy precautions:24002}  WEIGHT BEARING RESTRICTIONS: {Yes ***/No:24003}  FALLS:  Has patient fallen in last 6 months? {fallsyesno:27318}  LIVING ENVIRONMENT: Lives with: {OPRC lives with:25569::"lives with their family"} Lives in: {Lives in:25570} Stairs: {opstairs:27293} Has following equipment at home: {Assistive devices:23999}  OCCUPATION: ***  PLOF: Independent  PATIENT GOALS: ***  NEXT MD VISIT: ***  OBJECTIVE:   DIAGNOSTIC FINDINGS:  ***  PATIENT SURVEYS:    SCREENING FOR RED FLAGS: Bowel or bladder incontinence: {Yes/No:304960894} Spinal tumors: {Yes/No:304960894} Cauda equina syndrome:  {Yes/No:304960894} Compression fracture: {Yes/No:304960894} Abdominal aneurysm: {Yes/No:304960894}  COGNITION: Overall cognitive status: {cognition:24006}     SENSATION: {sensation:27233}  MUSCLE LENGTH: Hamstrings: Right *** deg; Left *** deg Thomas test: Right *** deg; Left *** deg  POSTURE: {posture:25561}  PALPATION: Central t-spine/mid.. And L, into Lateral ribs with breathing Tender in R rhomboid Tight in bil UT s,  Tender bil gr trock.   LUMBAR ROM:  Neck: Flex, Rot: mild limitation:  Ext: mild limitaiton and pain  Shoulder: WNL   AROM eval  Flexion   Extension   Right lateral flexion   Left lateral flexion   Right rotation   Left rotation    (Blank rows = not tested)  LOWER EXTREMITY ROM:     Active  Right eval Left eval  Hip flexion    Hip extension    Hip abduction    Hip adduction    Hip internal rotation    Hip external rotation    Knee flexion    Knee extension    Ankle dorsiflexion    Ankle plantarflexion    Ankle inversion    Ankle eversion     (Blank rows = not tested)  LOWER EXTREMITY MMT:    MMT Right eval Left eval  Hip flexion    Hip extension    Hip abduction    Hip adduction    Hip internal rotation    Hip external rotation    Knee flexion    Knee extension    Ankle dorsiflexion    Ankle plantarflexion    Ankle inversion    Ankle eversion     (Blank rows = not tested)  LUMBAR SPECIAL TESTS:  {lumbar special test:25242}  FUNCTIONAL TESTS:  {Functional tests:24029}  GAIT: Distance walked: *** Assistive device utilized: {Assistive devices:23999} Level of assistance: {Levels of assistance:24026} Comments: ***  TODAY'S TREATMENT:  DATE: ***    PATIENT EDUCATION:  Education details: *** Person educated: {Person educated:25204} Education method: {Education Method:25205} Education  comprehension: {Education Comprehension:25206}  HOME EXERCISE PROGRAM: ***  ASSESSMENT:  CLINICAL IMPRESSION: Patient is a *** y.o. *** who was seen today for physical therapy evaluation and treatment for ***.   OBJECTIVE IMPAIRMENTS: {opptimpairments:25111}.   ACTIVITY LIMITATIONS: {activitylimitations:27494}  PARTICIPATION LIMITATIONS: {participationrestrictions:25113}  PERSONAL FACTORS: {Personal factors:25162} are also affecting patient's functional outcome.   REHAB POTENTIAL: {rehabpotential:25112}  CLINICAL DECISION MAKING: {clinical decision making:25114}  EVALUATION COMPLEXITY: {Evaluation complexity:25115}   GOALS: Goals reviewed with patient? {yes/no:20286}  SHORT TERM GOALS: Target date: ***  *** Baseline: Goal status: {GOALSTATUS:25110}  2.  *** Baseline:  Goal status: {GOALSTATUS:25110}  3.  *** Baseline:  Goal status: {GOALSTATUS:25110}  4.  *** Baseline:  Goal status: {GOALSTATUS:25110}  5.  *** Baseline:  Goal status: {GOALSTATUS:25110}  6.  *** Baseline:  Goal status: {GOALSTATUS:25110}  LONG TERM GOALS: Target date: ***  *** Baseline:  Goal status: {GOALSTATUS:25110}  2.  *** Baseline:  Goal status: {GOALSTATUS:25110}  3.  *** Baseline:  Goal status: {GOALSTATUS:25110}  4.  *** Baseline:  Goal status: {GOALSTATUS:25110}  5.  *** Baseline:  Goal status: {GOALSTATUS:25110}  6.  *** Baseline:  Goal status: {GOALSTATUS:25110}  PLAN:  PT FREQUENCY: {rehab frequency:25116}  PT DURATION: {rehab duration:25117}  PLANNED INTERVENTIONS: {rehab planned interventions:25118::"Therapeutic exercises","Therapeutic activity","Neuromuscular re-education","Balance training","Gait training","Patient/Family education","Self Care","Joint mobilization"}.  PLAN FOR NEXT SESSION: ***   Lyndee Hensen, PT 12/22/2022, 9:36 AM

## 2022-12-23 ENCOUNTER — Encounter: Payer: Self-pay | Admitting: Physical Therapy

## 2022-12-24 ENCOUNTER — Encounter: Payer: Self-pay | Admitting: Family Medicine

## 2022-12-24 ENCOUNTER — Ambulatory Visit: Payer: Medicare PPO | Admitting: Physical Therapy

## 2022-12-24 ENCOUNTER — Encounter: Payer: Self-pay | Admitting: Physical Therapy

## 2022-12-24 ENCOUNTER — Other Ambulatory Visit: Payer: Self-pay

## 2022-12-24 DIAGNOSIS — M546 Pain in thoracic spine: Secondary | ICD-10-CM

## 2022-12-24 DIAGNOSIS — M25551 Pain in right hip: Secondary | ICD-10-CM | POA: Diagnosis not present

## 2022-12-24 DIAGNOSIS — M542 Cervicalgia: Secondary | ICD-10-CM

## 2022-12-24 DIAGNOSIS — M25552 Pain in left hip: Secondary | ICD-10-CM | POA: Diagnosis not present

## 2022-12-24 MED ORDER — ROSUVASTATIN CALCIUM 10 MG PO TABS: 20.0000 mg | ORAL_TABLET | Freq: Every day | ORAL | 3 refills | Status: DC

## 2022-12-24 NOTE — Therapy (Signed)
OUTPATIENT PHYSICAL THERAPY THORACOLUMBAR TREATMENT   Patient Name: Yuridiana Appleton MRN: [id number] DOB:11/11/1950, 72 y.o., female Today's Date: 12/24/2022  END OF SESSION:  PT End of Session - 12/24/22 1351     Visit Number 2    Number of Visits 16    Date for PT Re-Evaluation 02/16/23    Authorization Type [employer]    PT Start Time 1352    PT Stop Time 1430    PT Time Calculation (min) 38 min    Activity Tolerance Patient tolerated treatment well    Behavior During Therapy [facility] for tasks assessed/performed             Past Medical History:  Diagnosis Date   ACE-inhibitor cough 11/25/2020   Allergy    seasonal   Arthritis    Basal cell carcinoma of skin 02/29/2012   basal cell carcinoma on her left melolabial fold approximately 30 years  ago   Diverticulitis    Essential hypertension 03/29/2019   GERD (gastroesophageal reflux disease) 03/29/2019   Chronic, normal EGD. Chronic PPI   Mild intermittent asthma without complication 123XX123   Postmenopausal atrophic vaginitis 03/13/2013   Primary insomnia 11/16/2017   Seasonal allergic rhinitis due to pollen 03/29/2019   Past Surgical History:  Procedure Laterality Date   CHOLECYSTECTOMY     COLONOSCOPY     x2   NASAL SINUS SURGERY     THYROIDECTOMY Right 12/08/2021   Procedure: THYROID LOBECTOMY;  Surgeon: [name], MD;  Location: [facility];  Service: ENT;  Laterality: Right;   UPPER GASTROINTESTINAL ENDOSCOPY     Patient Active Problem List   Diagnosis Date Noted   Spondylosis of cervical 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 myelopathy or radiculopathy 12/02/2022   Globus sensation [id number]   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   ACE-inhibitor cough 11/25/2020   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   Primary localized osteoarthrosis of multiple sites 09/04/2019   Chronic allergic rhinitis 09/04/2019   Essential hypertension 03/29/2019   Mild intermittent asthma  without complication 123XX123   Dry eye syndrome 03/29/2019   Family history of premature CAD 03/29/2019   GAD (generalized anxiety disorder) 04/13/2018   Mixed hyperlipidemia 04/13/2018   Primary insomnia 11/16/2017   Gastroesophageal reflux disease 10/29/2015   Postmenopausal atrophic vaginitis 03/13/2013   Dyspareunia 03/13/2013   Basal cell carcinoma of skin 02/29/2012    PCP: Billey Chang   REFERRING PROVIDER: Benito Mccreedy  REFERRING DIAG: thoracic pain, neck pain, rhomboid strain,   Rationale for Evaluation and Treatment: Rehabilitation  THERAPY DIAG:  Cervicalgia  Pain in thoracic spine  Bilateral hip pain  ONSET DATE:   SUBJECTIVE:  SUBJECTIVE STATEMENT: Pt with no new complaints. States less stiffness in neck. Has been trying to sleep on her back.   Eval: Pt has had ongoing pain in thoracic/shoulder blade area for a long time. She was seen in PT in previous years, reporting some improvement at that time. She has been caring for her sick husband, increased lifting, etc, husband now has passed away. She did take round of prednisone about 1 month ago, did not have lasting effects. She is unable to take Nsaids, tylenol does not help much.  States Arthritis in neck, some pain, stiffness. Most bothersome pain is in back/shoulder blades, reports some improvement with activity.  She is Also having pain in bil gr trochanters, sore with getting up,  More with standing  She is Walking some for exercise, but limited due to pain.    PERTINENT HISTORY: diverticulitis, thyroid, HTN, OA,    PAIN:  Are you having pain? Yes: NPRS scale: 6/10 Pain location: thoracic  Pain description: reaching, carrying, lifting  Aggravating factors: Deep breath,  Relieving factors: movement   Are you having  pain? Yes: NPRS scale: 5/10 Pain location: neck Pain description: stiff, tight Aggravating factors: movement  Relieving factors: none stated   Are you having pain? Yes: NPRS scale: 5/10 Pain location: bil hips Pain description: sore Aggravating factors: increased activity  Relieving factors: none stated   PRECAUTIONS: None  WEIGHT BEARING RESTRICTIONS: No  FALLS:  Has patient fallen in last 6 months? No   PLOF: Independent  PATIENT GOALS:  decreased pain in t-spine, hips,   NEXT MD VISIT:   OBJECTIVE:   DIAGNOSTIC FINDINGS:    PATIENT SURVEYS:   COGNITION: Overall cognitive status: Within functional limits for tasks assessed     SENSATION:  POSTURE:   PALPATION: Central t-spine/mid.. And L, into Lateral ribs with breathing Tender in R rhomboid Tight in bil UT s,  Tender bil gr troch. Mild tightness and tenderness in bil cervical paraspinals,   ROM:  Neck: Flex, Rot: mild limitation:  Ext: mild limitaiton and pain  Shoulder: WNL Hips: WFL, mild limitation for ER, IR bil  Thoracic: mild limitation for all motions   LOWER EXTREMITY MMT:    MMT Right eval Left eval  Hip flexion 4 4  Hip extension    Hip abduction 4- 4-  Hip adduction    Hip internal rotation    Hip external rotation 4 4  Knee flexion 5 5  Knee extension 5 5  Ankle dorsiflexion    Ankle plantarflexion    Ankle inversion    Ankle eversion     (Blank rows = not tested)  LUMBAR SPECIAL TESTS:   FUNCTIONAL TESTS:    TODAY'S TREATMENT:                                                                                                                              DATE:   12/24/22:  Therapeutic Exercise: Aerobic: Supine:  SLR 2 x 10 bil;  S/L:  hip abd: 2x 10 bil;  Seated:  thoracic rotation x 10; Cervical rotation with t rotation x 5 bil; Neck flexion ROM x 10; posterior pelvic tilts x 15;  Standing:  Stretches: Seated HSS 20 sec x 3 bil; LTR x 10; Seated side bending x 3 bil;   Childs pose seated with hands on chair x 3 l, r, center.  Neuromuscular Re-education: Manual Therapy: TPR to R levator and rhomoid/tennis ball.  Self Care:   12/22/22:  Ther ex: see below for HEP  PATIENT EDUCATION:  Education details: Updated and reviewed HEP Person educated: Patient Education method: Explanation, Demonstration, Tactile cues, Verbal cues, and Handouts Education comprehension: verbalized understanding, returned demonstration, verbal cues required, tactile cues required, and needs further education   HOME EXERCISE PROGRAM: Access Code: [email or web address]  ASSESSMENT:  CLINICAL IMPRESSION: Pt with good tolerance for activities today. She has minimal pain during exercises. Reviewed and updated HEP, will focus more on t-spine pain next visit with manual as needed for TPR.   Eval: Patient presents with primary complaint of increased pain in thoracic region. She has stiffness in t-spine, with increased muscle tension in scapular region. She has decreased ability for lift, carry, and IADls with UEs due to pain. She also has mild muscle tension, stiffness, and pain in neck, consistent with OA. She has bil hip pain in gr trochanters, and will benefit from strengthening for improving stability and ability for walking, stairs, and functional actvitiy. Pt to benefit from skilled PT to improve deficits and pain.   OBJECTIVE IMPAIRMENTS: decreased activity tolerance, decreased balance, decreased knowledge of use of DME, decreased mobility, difficulty walking, decreased ROM, decreased strength, increased muscle spasms, impaired flexibility, impaired UE functional use, improper body mechanics, and pain.   ACTIVITY LIMITATIONS: carrying, lifting, bending, standing, squatting, stairs, reach over head, hygiene/grooming, locomotion level, and caring for others  PARTICIPATION LIMITATIONS: meal prep, cleaning, laundry, shopping, and community activity  PERSONAL FACTORS:  none  are also affecting  patient's functional outcome.   REHAB POTENTIAL: Good  CLINICAL DECISION MAKING: Stable/uncomplicated  EVALUATION COMPLEXITY: Low   GOALS: Goals reviewed with patient? Yes  SHORT TERM GOALS: Target date: 01/05/2023   Pt to be independent with initial HEP  Goal status: INITIAL  2.  Pt to demo improved cervical flex and rot ROM to be WFL/pain free.   Goal status: INITIAL     LONG TERM GOALS: Target date: 02/16/2023  Pt to be independent with final HEP  Goal status: INITIAL  2.  Pt to report decreased pain in t-spine to 0-2/10 with activity, IADLS, and lifting.   Goal status: INITIAL  3.  Pt to report decreased pain in bil hips to 0-2/10 with standing, walking and activity for at least 30 min.   Goal status: INITIAL  4.  Pt to demo improved strength of hips to at least 4+/5 to improve stability and pain.    Goal status: INITIAL    PLAN:  PT FREQUENCY: 1-2x/week  PT DURATION: 8 weeks  PLANNED INTERVENTIONS: Therapeutic exercises, Therapeutic activity, Neuromuscular re-education, Patient/Family education, Self Care, Joint mobilization, Joint manipulation, Stair training, Orthotic/Fit training, DME instructions, Aquatic Therapy, Dry Needling, Electrical stimulation, Cryotherapy, Moist heat, Taping, Ultrasound, Ionotophoresis 4mg /ml Dexamethasone, Manual therapy,  Vasopneumatic device, Traction, Spinal manipulation, Spinal mobilization,Balance training, Gait training,   PLAN FOR NEXT SESSION:    Lyndee Hensen, PT, DPT 1:51 PM  12/24/22

## 2022-12-29 ENCOUNTER — Encounter: Payer: Self-pay | Admitting: Physical Therapy

## 2022-12-29 ENCOUNTER — Ambulatory Visit (INDEPENDENT_AMBULATORY_CARE_PROVIDER_SITE_OTHER): Payer: Medicare PPO | Admitting: Physical Therapy

## 2022-12-29 DIAGNOSIS — M546 Pain in thoracic spine: Secondary | ICD-10-CM | POA: Diagnosis not present

## 2022-12-29 DIAGNOSIS — M25552 Pain in left hip: Secondary | ICD-10-CM

## 2022-12-29 DIAGNOSIS — M25551 Pain in right hip: Secondary | ICD-10-CM | POA: Diagnosis not present

## 2022-12-29 DIAGNOSIS — M542 Cervicalgia: Secondary | ICD-10-CM

## 2022-12-29 NOTE — Therapy (Signed)
OUTPATIENT PHYSICAL THERAPY THORACOLUMBAR TREATMENT   Patient Name: Tina Kelley MRN: [id number] DOB:Mar 20, 1951, 72 y.o., female Today's Date: 12/29/2022  END OF SESSION:  PT End of Session - 12/29/22 1358     Visit Number 3    Number of Visits 16    Date for PT Re-Evaluation 02/16/23    Authorization Type [employer]    PT Start Time 1306    PT Stop Time 1345    PT Time Calculation (min) 39 min    Activity Tolerance Patient tolerated treatment well    Behavior During Therapy [facility] for tasks assessed/performed              Past Medical History:  Diagnosis Date   ACE-inhibitor cough 11/25/2020   Allergy    seasonal   Arthritis    Basal cell carcinoma of skin 02/29/2012   basal cell carcinoma on her left melolabial fold approximately 30 years  ago   Diverticulitis    Essential hypertension 03/29/2019   GERD (gastroesophageal reflux disease) 03/29/2019   Chronic, normal EGD. Chronic PPI   Mild intermittent asthma without complication 123XX123   Postmenopausal atrophic vaginitis 03/13/2013   Primary insomnia 11/16/2017   Seasonal allergic rhinitis due to pollen 03/29/2019   Past Surgical History:  Procedure Laterality Date   CHOLECYSTECTOMY     COLONOSCOPY     x2   NASAL SINUS SURGERY     THYROIDECTOMY Right 12/08/2021   Procedure: THYROID LOBECTOMY;  Surgeon: [name], MD;  Location: [facility] OR;  Service: ENT;  Laterality: Right;   UPPER GASTROINTESTINAL ENDOSCOPY     Patient Active Problem List   Diagnosis Date Noted   Spondylosis of cervical 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 myelopathy or radiculopathy 12/02/2022   Globus sensation [id number]   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   ACE-inhibitor cough 11/25/2020   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   Primary localized osteoarthrosis of multiple sites 09/04/2019   Chronic allergic rhinitis 09/04/2019   Essential hypertension 03/29/2019   Mild intermittent asthma  without complication 123XX123   Dry eye syndrome 03/29/2019   Family history of premature CAD 03/29/2019   GAD (generalized anxiety disorder) 04/13/2018   Mixed hyperlipidemia 04/13/2018   Primary insomnia 11/16/2017   Gastroesophageal reflux disease 10/29/2015   Postmenopausal atrophic vaginitis 03/13/2013   Dyspareunia 03/13/2013   Basal cell carcinoma of skin 02/29/2012    PCP: Billey Chang   REFERRING PROVIDER: Benito Mccreedy  REFERRING DIAG: thoracic pain, neck pain, rhomboid strain,   Rationale for Evaluation and Treatment: Rehabilitation  THERAPY DIAG:  Cervicalgia  Pain in thoracic spine  Bilateral hip pain  ONSET DATE:   SUBJECTIVE:  SUBJECTIVE STATEMENT: Pt did a lot on Sunday, cooking, etc, and had minimal pain in t-spine. Has been doing HEP.    Eval: Pt has had ongoing pain in thoracic/shoulder blade area for a long time. She was seen in PT in previous years, reporting some improvement at that time. She has been caring for her sick husband, increased lifting, etc, husband now has passed away. She did take round of prednisone about 1 month ago, did not have lasting effects. She is unable to take Nsaids, tylenol does not help much.  States Arthritis in neck, some pain, stiffness. Most bothersome pain is in back/shoulder blades, reports some improvement with activity.  She is Also having pain in bil gr trochanters, sore with getting up,  More with standing  She is Walking some for exercise, but limited due to pain.    PERTINENT HISTORY: diverticulitis, thyroid, HTN, OA,    PAIN:  Are you having pain? Yes: NPRS scale: 6/10 Pain location: thoracic  Pain description: reaching, carrying, lifting  Aggravating factors: Deep breath,  Relieving factors: movement   Are you having pain?  Yes: NPRS scale: 5/10 Pain location: neck Pain description: stiff, tight Aggravating factors: movement  Relieving factors: none stated   Are you having pain? Yes: NPRS scale: 5/10 Pain location: bil hips Pain description: sore Aggravating factors: increased activity  Relieving factors: none stated   PRECAUTIONS: None  WEIGHT BEARING RESTRICTIONS: No  FALLS:  Has patient fallen in last 6 months? No   PLOF: Independent  PATIENT GOALS:  decreased pain in t-spine, hips,   NEXT MD VISIT:   OBJECTIVE:   DIAGNOSTIC FINDINGS:    PATIENT SURVEYS:   COGNITION: Overall cognitive status: Within functional limits for tasks assessed     SENSATION:  POSTURE:   PALPATION: Central t-spine/mid.. And L, into Lateral ribs with breathing Tender in R rhomboid Tight in bil UT s,  Tender bil gr troch. Mild tightness and tenderness in bil cervical paraspinals,   ROM:  Neck: Flex, Rot: mild limitation:  Ext: mild limitaiton and pain  Shoulder: WNL Hips: WFL, mild limitation for ER, IR bil  Thoracic: mild limitation for all motions   LOWER EXTREMITY MMT:    MMT Right eval Left eval  Hip flexion 4 4  Hip extension    Hip abduction 4- 4-  Hip adduction    Hip internal rotation    Hip external rotation 4 4  Knee flexion 5 5  Knee extension 5 5  Ankle dorsiflexion    Ankle plantarflexion    Ankle inversion    Ankle eversion     (Blank rows = not tested)  LUMBAR SPECIAL TESTS:   FUNCTIONAL TESTS:    TODAY'S TREATMENT:                                                                                                                              DATE:   12/29/22: Therapeutic Exercise: Aerobic: Supine:  SLR 2 x 10 bil;  SA presses 3 lb x 15 bil;  Chest press 3 lb x 15; Horiz abd x 15 bil;  S/L:  hip abd: 2x 10 bil;  Seated:  Sit to stand 3 x 5 with education on trunk posture and TA;  Standing:  Shoulder Flexion/Arom x 15 at wall with focus on back posture; Bent over row  3lb bil x 15;  Rows GTB x 15 with cuing for back posture;  Stretches:  LTR x 10;  Standing side bending x 3 bil;   Neuromuscular Re-education: Manual Therapy: thoracic PA mobs, TPR to R levator and rhomoid/tennis ball.  Self Care:   12/24/22:  Therapeutic Exercise: Aerobic: Supine:  SLR 2 x 10 bil;  S/L:  hip abd: 2x 10 bil;  Seated:  thoracic rotation x 10; Cervical rotation with t rotation x 5 bil; Neck flexion ROM x 10; posterior pelvic tilts x 15;  Standing:  Stretches: Seated HSS 20 sec x 3 bil; LTR x 10; Seated side bending x 3 bil;  Childs pose seated with hands on chair x 3 l, r, center.  Neuromuscular Re-education: Manual Therapy: TPR to R levator and rhomoid/tennis ball.  Self Care:   12/22/22:  Ther ex: see below for HEP  PATIENT EDUCATION:  Education details: Updated and reviewed HEP Person educated: Patient Education method: Explanation, Demonstration, Tactile cues, Verbal cues, and Handouts Education comprehension: verbalized understanding, returned demonstration, verbal cues required, tactile cues required, and needs further education   HOME EXERCISE PROGRAM: Access Code: [email or web address]  ASSESSMENT:  CLINICAL IMPRESSION: Pt with less pain in t-spine today. Focus on posture, standing and seated with movement. Cuing for TA with trunk movement, and doing hip hinge vs increased motion from t-spine. Pt able to progress strength for scapular muscles, pt challenged due to weakness, but no pain. Plan to progress as able   Eval: Patient presents with primary complaint of increased pain in thoracic region. She has stiffness in t-spine, with increased muscle tension in scapular region. She has decreased ability for lift, carry, and IADls with UEs due to pain. She also has mild muscle tension, stiffness, and pain in neck, consistent with OA. She has bil hip pain in gr trochanters, and will benefit from strengthening for improving stability and ability for walking, stairs, and  functional actvitiy. Pt to benefit from skilled PT to improve deficits and pain.   OBJECTIVE IMPAIRMENTS: decreased activity tolerance, decreased balance, decreased knowledge of use of DME, decreased mobility, difficulty walking, decreased ROM, decreased strength, increased muscle spasms, impaired flexibility, impaired UE functional use, improper body mechanics, and pain.   ACTIVITY LIMITATIONS: carrying, lifting, bending, standing, squatting, stairs, reach over head, hygiene/grooming, locomotion level, and caring for others  PARTICIPATION LIMITATIONS: meal prep, cleaning, laundry, shopping, and community activity  PERSONAL FACTORS:  none  are also affecting patient's functional outcome.   REHAB POTENTIAL: Good  CLINICAL DECISION MAKING: Stable/uncomplicated  EVALUATION COMPLEXITY: Low   GOALS: Goals reviewed with patient? Yes  SHORT TERM GOALS: Target date: 01/05/2023   Pt to be independent with initial HEP  Goal status: INITIAL  2.  Pt to demo improved cervical flex and rot ROM to be WFL/pain free.   Goal status: INITIAL     LONG TERM GOALS: Target date: 02/16/2023  Pt to be independent with final HEP  Goal status: INITIAL  2.  Pt to report decreased pain in t-spine to 0-2/10 with activity, IADLS, and lifting.   Goal status: INITIAL  3.  Pt to report decreased pain in bil hips to 0-2/10 with standing, walking and activity for at least 30 min.   Goal status: INITIAL  4.  Pt to demo improved strength of hips to at least 4+/5 to improve stability and pain.    Goal status: INITIAL    PLAN:  PT FREQUENCY: 1-2x/week  PT DURATION: 8 weeks  PLANNED INTERVENTIONS: Therapeutic exercises, Therapeutic activity, Neuromuscular re-education, Patient/Family education, Self Care, Joint mobilization, Joint manipulation, Stair training, Orthotic/Fit training, DME instructions, Aquatic Therapy, Dry Needling, Electrical stimulation, Cryotherapy, Moist heat, Taping, Ultrasound,  Ionotophoresis 4mg /ml Dexamethasone, Manual therapy,  Vasopneumatic device, Traction, Spinal manipulation, Spinal mobilization,Balance training, Gait training,   PLAN FOR NEXT SESSION:    Lyndee Hensen, PT, DPT 1:59 PM  12/29/22

## 2022-12-30 ENCOUNTER — Telehealth: Payer: Self-pay | Admitting: Internal Medicine

## 2022-12-30 NOTE — Telephone Encounter (Signed)
Pt states she does not think she can do the EM and she is feeling better and would like the appt cancelled. She wanted to schedule f/u with Dr. Hilarie Fredrickson. Pt scheduled to see Dr. Hilarie Fredrickson 7/9 @1 :50pm. Pt aware and EM cancelled per request.

## 2022-12-30 NOTE — Telephone Encounter (Signed)
Inbound call from patient want to speak with a nurse in regards a appt she have coming up at [facility] long with dr.pyrtle .please advise

## 2022-12-31 ENCOUNTER — Encounter: Payer: Medicare PPO | Admitting: Physical Therapy

## 2023-01-01 ENCOUNTER — Ambulatory Visit: Payer: Medicare PPO | Admitting: Family Medicine

## 2023-01-05 ENCOUNTER — Encounter: Payer: Self-pay | Admitting: Physical Therapy

## 2023-01-05 ENCOUNTER — Ambulatory Visit: Payer: Medicare PPO | Admitting: Physical Therapy

## 2023-01-05 DIAGNOSIS — M25551 Pain in right hip: Secondary | ICD-10-CM

## 2023-01-05 DIAGNOSIS — M542 Cervicalgia: Secondary | ICD-10-CM | POA: Diagnosis not present

## 2023-01-05 DIAGNOSIS — M546 Pain in thoracic spine: Secondary | ICD-10-CM

## 2023-01-05 DIAGNOSIS — M25552 Pain in left hip: Secondary | ICD-10-CM | POA: Diagnosis not present

## 2023-01-05 NOTE — Therapy (Signed)
OUTPATIENT PHYSICAL THERAPY THORACOLUMBAR TREATMENT   Patient Name: Tina Kelley MRN: [id number] DOB:10/15/1950, 72 y.o., female Today's Date: 01/05/2023  END OF SESSION:  PT End of Session - 01/05/23 1105     Visit Number 4    Number of Visits 16    Date for PT Re-Evaluation 02/16/23    Authorization Type [employer]    PT Start Time 1107    PT Stop Time 1145    PT Time Calculation (min) 38 min    Activity Tolerance Patient tolerated treatment well    Behavior During Therapy [facility] for tasks assessed/performed              Past Medical History:  Diagnosis Date   ACE-inhibitor cough 11/25/2020   Allergy    seasonal   Arthritis    Basal cell carcinoma of skin 02/29/2012   basal cell carcinoma on her left melolabial fold approximately 30 years  ago   Diverticulitis    Essential hypertension 03/29/2019   GERD (gastroesophageal reflux disease) 03/29/2019   Chronic, normal EGD. 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   Primary insomnia 11/16/2017   Seasonal allergic rhinitis due to pollen 03/29/2019   Past Surgical History:  Procedure Laterality Date   CHOLECYSTECTOMY     COLONOSCOPY     x2   NASAL SINUS SURGERY     THYROIDECTOMY Right 12/08/2021   Procedure: THYROID LOBECTOMY;  Surgeon: Serena Colonel, MD;  Location: [facility] OR;  Service: ENT;  Laterality: Right;   UPPER GASTROINTESTINAL ENDOSCOPY     Patient Active Problem List   Diagnosis Date Noted   Spondylosis of cervical 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 myelopathy or radiculopathy 12/02/2022   Globus sensation 10/19/2022   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   ACE-inhibitor cough 11/25/2020   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   Primary localized osteoarthrosis of multiple sites 09/04/2019   Chronic allergic rhinitis 09/04/2019   Essential hypertension 03/29/2019   Mild intermittent asthma  without complication 03/29/2019   Dry eye syndrome 03/29/2019   Family history of premature CAD 03/29/2019   GAD (generalized anxiety disorder) 04/13/2018   Mixed hyperlipidemia 04/13/2018   Primary insomnia 11/16/2017   Gastroesophageal reflux disease 10/29/2015   Postmenopausal atrophic vaginitis 03/13/2013   Dyspareunia 03/13/2013   Basal cell carcinoma of skin 02/29/2012    PCP: Asencion Partridge   REFERRING PROVIDER: [name]  REFERRING DIAG: thoracic pain, neck pain, rhomboid strain,   Rationale for Evaluation and Treatment: Rehabilitation  THERAPY DIAG:  Cervicalgia  Pain in thoracic spine  Bilateral hip pain  ONSET DATE:   SUBJECTIVE:  SUBJECTIVE STATEMENT: Pt doing ok. R hip still sore, mostly with initial standing, and back is sore with increased housework and activity. Has been doing HEP daily.    Eval: Pt has had ongoing pain in thoracic/shoulder blade area for a long time. She was seen in PT in previous years, reporting some improvement at that time. She has been caring for her sick husband, increased lifting, etc, husband now has passed away. She did take round of prednisone about 1 month ago, did not have lasting effects. She is unable to take Nsaids, tylenol does not help much.  States Arthritis in neck, some pain, stiffness. Most bothersome pain is in back/shoulder blades, reports some improvement with activity.  She is Also having pain in bil gr trochanters, sore with getting up,  More with standing  She is Walking some for exercise, but limited due to pain.    PERTINENT HISTORY: diverticulitis, thyroid, HTN, OA,    PAIN:  Are you having pain? Yes: NPRS scale: 6/10 Pain location: thoracic  Pain description: reaching, carrying, lifting  Aggravating factors: Deep breath,   Relieving factors: movement   Are you having pain? Yes: NPRS scale: 5/10 Pain location: neck Pain description: stiff, tight Aggravating factors: movement  Relieving factors: none stated   Are you having pain? Yes: NPRS scale: 5/10 Pain location: bil hips Pain description: sore Aggravating factors: increased activity  Relieving factors: none stated   PRECAUTIONS: None  WEIGHT BEARING RESTRICTIONS: No  FALLS:  Has patient fallen in last 6 months? No   PLOF: Independent  PATIENT GOALS:  decreased pain in t-spine, hips,   NEXT MD VISIT:   OBJECTIVE:   DIAGNOSTIC FINDINGS:    PATIENT SURVEYS:   COGNITION: Overall cognitive status: Within functional limits for tasks assessed     SENSATION:  POSTURE:   PALPATION: Central t-spine/mid.. And L, into Lateral ribs with breathing Tender in R rhomboid Tight in bil UT s,  Tender bil gr troch. Mild tightness and tenderness in bil cervical paraspinals,   ROM:  Neck: Flex, Rot: mild limitation:  Ext: mild limitaiton and pain  Shoulder: WNL Hips: WFL, mild limitation for ER, IR bil  Thoracic: mild limitation for all motions   LOWER EXTREMITY MMT:    MMT Right eval Left eval  Hip flexion 4 4  Hip extension    Hip abduction 4- 4-  Hip adduction    Hip internal rotation    Hip external rotation 4 4  Knee flexion 5 5  Knee extension 5 5  Ankle dorsiflexion    Ankle plantarflexion    Ankle inversion    Ankle eversion     (Blank rows = not tested)  LUMBAR SPECIAL TESTS:   FUNCTIONAL TESTS:    TODAY'S TREATMENT:                                                                                                                              DATE:  01/05/23: Therapeutic Exercise: Aerobic: Supine:  SLR 2 x 10 bil;  Bridging 2 x 10;  S/L:  hip abd: 2x 10 bil;  Seated:   Standing:  Bent over row 4lb bil x 10;  Rows GTB x 15 with cuing for back posture; Chest press with TA x 20 RTB; Wall push ups 2 x 10;   Stretches:  LTR x 10;  Standing side bending x 3 bil;   Neuromuscular Re-education: Manual Therapy: thoracic PA mobs, DTM to R mid/lower thoracic paraspinals.  Self Care:   12/29/22: Therapeutic Exercise: Aerobic: Supine:  SLR 2 x 10 bil;  SA presses 3 lb x 15 bil;  Chest press 3 lb x 15; Horiz abd x 15 bil;  S/L:  hip abd: 2x 10 bil;  Seated:  Sit to stand 3 x 5 with education on trunk posture and TA;  Standing:  Shoulder Flexion/Arom x 15 at wall with focus on back posture; Bent over row 3lb bil x 15;  Rows GTB x 15 with cuing for back posture;  Stretches:  LTR x 10;  Standing side bending x 3 bil;   Neuromuscular Re-education: Manual Therapy: thoracic PA mobs, TPR to R levator and rhomoid/tennis ball.  Self Care:   12/24/22:  Therapeutic Exercise: Aerobic: Supine:  SLR 2 x 10 bil;  S/L:  hip abd: 2x 10 bil;  Seated:  thoracic rotation x 10; Cervical rotation with t rotation x 5 bil; Neck flexion ROM x 10; posterior pelvic tilts x 15;  Standing:  Stretches: Seated HSS 20 sec x 3 bil; LTR x 10; Seated side bending x 3 bil;  Childs pose seated with hands on chair x 3 l, r, center.  Neuromuscular Re-education: Manual Therapy: TPR to R levator and rhomoid/tennis ball.  Self Care:   12/22/22:  Ther ex: see below for HEP  PATIENT EDUCATION:  Education details: Updated and reviewed HEP Person educated: Patient Education method: Explanation, Demonstration, Tactile cues, Verbal cues, and Handouts Education comprehension: verbalized understanding, returned demonstration, verbal cues required, tactile cues required, and needs further education   HOME EXERCISE PROGRAM: Access Code: [email or web address]  ASSESSMENT:  CLINICAL IMPRESSION: Pt with less pain in upper t-spine and levator, but most soreness and tightness in  mid/lower R thoracic paraspinals. Good tolerance for manual release. Continued strengthening for postural muscles and core/hips. Updated HEP and encouraged continued hip abd  for strength.   Eval: Patient presents with primary complaint of increased pain in thoracic region. She has stiffness in t-spine, with increased muscle tension in scapular region. She has decreased ability for lift, carry, and IADls with UEs due to pain. She also has mild muscle tension, stiffness, and pain in neck, consistent with OA. She has bil hip pain in gr trochanters, and will benefit from strengthening for improving stability and ability for walking, stairs, and functional actvitiy. Pt to benefit from skilled PT to improve deficits and pain.   OBJECTIVE IMPAIRMENTS: decreased activity tolerance, decreased balance, decreased knowledge of use of DME, decreased mobility, difficulty walking, decreased ROM, decreased strength, increased muscle spasms, impaired flexibility, impaired UE functional use, improper body mechanics, and pain.   ACTIVITY LIMITATIONS: carrying, lifting, bending, standing, squatting, stairs, reach over head, hygiene/grooming, locomotion level, and caring for others  PARTICIPATION LIMITATIONS: meal prep, cleaning, laundry, shopping, and community activity  PERSONAL FACTORS:  none  are also affecting patient's functional outcome.   REHAB POTENTIAL: Good  CLINICAL DECISION MAKING: Stable/uncomplicated  EVALUATION COMPLEXITY: Low   GOALS: Goals reviewed with  patient? Yes  SHORT TERM GOALS: Target date: 01/05/2023   Pt to be independent with initial HEP  Goal status: INITIAL  2.  Pt to demo improved cervical flex and rot ROM to be WFL/pain free.   Goal status: INITIAL     LONG TERM GOALS: Target date: 02/16/2023  Pt to be independent with final HEP  Goal status: INITIAL  2.  Pt to report decreased pain in t-spine to 0-2/10 with activity, IADLS, and lifting.   Goal status: INITIAL  3.  Pt to report decreased pain in bil hips to 0-2/10 with standing, walking and activity for at least 30 min.   Goal status: INITIAL  4.  Pt to demo improved strength of  hips to at least 4+/5 to improve stability and pain.    Goal status: INITIAL    PLAN:  PT FREQUENCY: 1-2x/week  PT DURATION: 8 weeks  PLANNED INTERVENTIONS: Therapeutic exercises, Therapeutic activity, Neuromuscular re-education, Patient/Family education, Self Care, Joint mobilization, Joint manipulation, Stair training, Orthotic/Fit training, DME instructions, Aquatic Therapy, Dry Needling, Electrical stimulation, Cryotherapy, Moist heat, Taping, Ultrasound, Ionotophoresis 4mg /ml Dexamethasone, Manual therapy,  Vasopneumatic device, Traction, Spinal manipulation, Spinal mobilization,Balance training, Gait training,   PLAN FOR NEXT SESSION:    Sedalia Muta, PT, DPT 11:06 AM  01/05/23

## 2023-01-07 ENCOUNTER — Ambulatory Visit: Payer: Medicare PPO | Admitting: Physical Therapy

## 2023-01-07 ENCOUNTER — Encounter: Payer: Self-pay | Admitting: Physical Therapy

## 2023-01-07 DIAGNOSIS — M546 Pain in thoracic spine: Secondary | ICD-10-CM | POA: Diagnosis not present

## 2023-01-07 DIAGNOSIS — M25551 Pain in right hip: Secondary | ICD-10-CM

## 2023-01-07 DIAGNOSIS — M25552 Pain in left hip: Secondary | ICD-10-CM | POA: Diagnosis not present

## 2023-01-07 DIAGNOSIS — M542 Cervicalgia: Secondary | ICD-10-CM | POA: Diagnosis not present

## 2023-01-07 NOTE — Therapy (Signed)
OUTPATIENT PHYSICAL THERAPY THORACOLUMBAR TREATMENT   Patient Name: Tina Kelley MRN: [id number] DOB:[birth year], 72 y.o., female Today's Date: 01/07/2023  END OF SESSION:  PT End of Session - 01/07/23 1106     Visit Number 5    Number of Visits 16    Date for PT Re-Evaluation 02/16/23    Authorization Type [employer]    PT Start Time 1107    PT Stop Time 1145    PT Time Calculation (min) 38 min    Activity Tolerance Patient tolerated treatment well    Behavior During Therapy [facility] for tasks assessed/performed              Past Medical History:  Diagnosis Date   ACE-inhibitor cough 11/25/2020   Allergy    seasonal   Arthritis    Basal cell carcinoma of skin 02/29/2012   basal cell carcinoma on her left melolabial fold approximately 30 years  ago   Diverticulitis    Essential hypertension 03/29/2019   GERD (gastroesophageal reflux disease) 03/29/2019   Chronic, normal EGD. 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   Primary insomnia 11/16/2017   Seasonal allergic rhinitis due to pollen 03/29/2019   Past Surgical History:  Procedure Laterality Date   CHOLECYSTECTOMY     COLONOSCOPY     x2   NASAL SINUS SURGERY     THYROIDECTOMY Right 12/08/2021   Procedure: THYROID LOBECTOMY;  Surgeon: Serena Colonelosen, Jefry, MD;  [facility];  Service: ENT;  Laterality: Right;   UPPER GASTROINTESTINAL ENDOSCOPY     Patient Active Problem List   Diagnosis Date Noted   Spondylosis of cervical 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 myelopathy or radiculopathy 12/02/2022   Globus sensation 10/19/2022   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   ACE-inhibitor cough 11/25/2020   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   Primary localized osteoarthrosis of multiple sites 09/04/2019   Chronic allergic rhinitis 09/04/2019   Essential hypertension 03/29/2019   Mild intermittent  asthma without complication 03/29/2019   Dry eye syndrome 03/29/2019   Family history of premature CAD 03/29/2019   GAD (generalized anxiety disorder) 04/13/2018   Mixed hyperlipidemia 04/13/2018   Primary insomnia 11/16/2017   Gastroesophageal reflux disease 10/29/2015   Postmenopausal atrophic vaginitis 03/13/2013   Dyspareunia 03/13/2013   Basal cell carcinoma of skin 02/29/2012    PCP: Asencion Partridgeamille Andy   REFERRING PROVIDER: Aleen SellsBen Jackson  REFERRING DIAG: thoracic pain, neck pain, rhomboid strain,   Rationale for Evaluation and Treatment: Rehabilitation  THERAPY DIAG:  Cervicalgia  Pain in thoracic spine  Bilateral hip pain  ONSET DATE:   SUBJECTIVE:  SUBJECTIVE STATEMENT: Pt doing ok. Pt states less pain in t-spine, has been stretching in between doing things at home. Hips also doing ok.   Eval: Pt has had ongoing pain in thoracic/shoulder blade area for a long time. She was seen in PT in previous years, reporting some improvement at that time. She has been caring for her sick husband, increased lifting, etc, husband now has passed away. She did take round of prednisone about 1 month ago, did not have lasting effects. She is unable to take Nsaids, tylenol does not help much.  States Arthritis in neck, some pain, stiffness. Most bothersome pain is in back/shoulder blades, reports some improvement with activity.  She is Also having pain in bil gr trochanters, sore with getting up,  More with standing  She is Walking some for exercise, but limited due to pain.    PERTINENT HISTORY: diverticulitis, thyroid, HTN, OA,    PAIN:  Are you having pain? Yes: NPRS scale: 6/10 Pain location: thoracic  Pain description: reaching, carrying, lifting  Aggravating factors: Deep breath,  Relieving factors:  movement   Are you having pain? Yes: NPRS scale: 5/10 Pain location: neck Pain description: stiff, tight Aggravating factors: movement  Relieving factors: none stated   Are you having pain? Yes: NPRS scale: 5/10 Pain location: bil hips Pain description: sore Aggravating factors: increased activity  Relieving factors: none stated   PRECAUTIONS: None  WEIGHT BEARING RESTRICTIONS: No  FALLS:  Has patient fallen in last 6 months? No   PLOF: Independent  PATIENT GOALS:  decreased pain in t-spine, hips,   NEXT MD VISIT:   OBJECTIVE:   DIAGNOSTIC FINDINGS:    PATIENT SURVEYS:   COGNITION: Overall cognitive status: Within functional limits for tasks assessed     SENSATION:  POSTURE:   PALPATION: Central t-spine/mid.. And L, into Lateral ribs with breathing Tender in R rhomboid Tight in bil UT s,  Tender bil gr troch. Mild tightness and tenderness in bil cervical paraspinals,   ROM:  Neck: Flex, Rot: mild limitation:  Ext: mild limitaiton and pain  Shoulder: WNL Hips: WFL, mild limitation for ER, IR bil  Thoracic: mild limitation for all motions   LOWER EXTREMITY MMT:    MMT Right eval Left eval  Hip flexion 4 4  Hip extension    Hip abduction 4- 4-  Hip adduction    Hip internal rotation    Hip external rotation 4 4  Knee flexion 5 5  Knee extension 5 5  Ankle dorsiflexion    Ankle plantarflexion    Ankle inversion    Ankle eversion     (Blank rows = not tested)  LUMBAR SPECIAL TESTS:   FUNCTIONAL TESTS:    TODAY'S TREATMENT:                                                                                                                              DATE:   01/07/23: Therapeutic Exercise:  Aerobic:  UBE L 1 x 4;  Supine:  SLR 2 x 10 bil;   Bridging 2 x 10;  SA presses 2 x 10;   S/L:  Seated:   Standing:   Bicep curls 4 lb bil x 2 x 10;  Rows GTB x 15 with cuing for back posture; Chest press with TA x 20 GTB;   Wall push ups 2 x 10;  Hip  abd 2 x 10 bil;  Stretches:  LTR x 10;  Standing side bending x 3 bil;   Neuromuscular Re-education: Manual Therapy:   Self Care:   01/05/23: Therapeutic Exercise: Aerobic: Supine:  SLR 2 x 10 bil;  Bridging 2 x 10;  S/L:  hip abd: 2x 10 bil;  Seated:   Standing:  Bent over row 4lb bil x 10;  Rows GTB x 15 with cuing for back posture; Chest press with TA x 20 RTB; Wall push ups 2 x 10;  Stretches:  LTR x 10;  Standing side bending x 3 bil;   Neuromuscular Re-education: Manual Therapy: thoracic PA mobs, DTM to R mid/lower thoracic paraspinals.  Self Care:   12/29/22: Therapeutic Exercise: Aerobic: Supine:  SLR 2 x 10 bil;  SA presses 3 lb x 15 bil;  Chest press 3 lb x 15; Horiz abd x 15 bil;  S/L:  hip abd: 2x 10 bil;  Seated:  Sit to stand 3 x 5 with education on trunk posture and TA;  Standing:  Shoulder Flexion/Arom x 15 at wall with focus on back posture; Bent over row 3lb bil x 15;  Rows GTB x 15 with cuing for back posture;  Stretches:  LTR x 10;  Standing side bending x 3 bil;   Neuromuscular Re-education: Manual Therapy: thoracic PA mobs, TPR to R levator and rhomoid/tennis ball.  Self Care:    PATIENT EDUCATION:  Education details: Updated and reviewed HEP Person educated: Patient Education method: Explanation, Demonstration, Tactile cues, Verbal cues, and Handouts Education comprehension: verbalized understanding, returned demonstration, verbal cues required, tactile cues required, and needs further education   HOME EXERCISE PROGRAM: Access Code: [email or web address]  ASSESSMENT:  CLINICAL IMPRESSION: Pt with less pain overall today. Minimal pain in hips or t-spine. Good ability for progressive strengthening today without increased pain. Pt to benefit from continued care.   Eval: Patient presents with primary complaint of increased pain in thoracic region. She has stiffness in t-spine, with increased muscle tension in scapular region. She has decreased ability for lift,  carry, and IADls with UEs due to pain. She also has mild muscle tension, stiffness, and pain in neck, consistent with OA. She has bil hip pain in gr trochanters, and will benefit from strengthening for improving stability and ability for walking, stairs, and functional actvitiy. Pt to benefit from skilled PT to improve deficits and pain.   OBJECTIVE IMPAIRMENTS: decreased activity tolerance, decreased balance, decreased knowledge of use of DME, decreased mobility, difficulty walking, decreased ROM, decreased strength, increased muscle spasms, impaired flexibility, impaired UE functional use, improper body mechanics, and pain.   ACTIVITY LIMITATIONS: carrying, lifting, bending, standing, squatting, stairs, reach over head, hygiene/grooming, locomotion level, and caring for others  PARTICIPATION LIMITATIONS: meal prep, cleaning, laundry, shopping, and community activity  PERSONAL FACTORS:  none  are also affecting patient's functional outcome.   REHAB POTENTIAL: Good  CLINICAL DECISION MAKING: Stable/uncomplicated  EVALUATION COMPLEXITY: Low   GOALS: Goals reviewed with patient? Yes  SHORT TERM GOALS: Target date: 01/05/2023   Pt to be  independent with initial HEP  Goal status: INITIAL  2.  Pt to demo improved cervical flex and rot ROM to be WFL/pain free.   Goal status: INITIAL     LONG TERM GOALS: Target date: 02/16/2023  Pt to be independent with final HEP  Goal status: INITIAL  2.  Pt to report decreased pain in t-spine to 0-2/10 with activity, IADLS, and lifting.   Goal status: INITIAL  3.  Pt to report decreased pain in bil hips to 0-2/10 with standing, walking and activity for at least 30 min.   Goal status: INITIAL  4.  Pt to demo improved strength of hips to at least 4+/5 to improve stability and pain.    Goal status: INITIAL    PLAN:  PT FREQUENCY: 1-2x/week  PT DURATION: 8 weeks  PLANNED INTERVENTIONS: Therapeutic exercises, Therapeutic activity,  Neuromuscular re-education, Patient/Family education, Self Care, Joint mobilization, Joint manipulation, Stair training, Orthotic/Fit training, DME instructions, Aquatic Therapy, Dry Needling, Electrical stimulation, Cryotherapy, Moist heat, Taping, Ultrasound, Ionotophoresis 4mg /ml Dexamethasone, Manual therapy,  Vasopneumatic device, Traction, Spinal manipulation, Spinal mobilization,Balance training, Gait training,   PLAN FOR NEXT SESSION:    Sedalia Muta, PT, DPT 11:06 AM  01/07/23

## 2023-01-12 ENCOUNTER — Encounter: Payer: Medicare PPO | Admitting: Physical Therapy

## 2023-01-14 ENCOUNTER — Encounter: Payer: Medicare PPO | Admitting: Physical Therapy

## 2023-01-18 ENCOUNTER — Ambulatory Visit: Payer: Medicare PPO | Admitting: Family Medicine

## 2023-01-19 ENCOUNTER — Ambulatory Visit: Payer: Medicare PPO | Admitting: Physical Therapy

## 2023-01-19 ENCOUNTER — Encounter: Payer: Self-pay | Admitting: Physical Therapy

## 2023-01-19 DIAGNOSIS — M546 Pain in thoracic spine: Secondary | ICD-10-CM

## 2023-01-19 DIAGNOSIS — M542 Cervicalgia: Secondary | ICD-10-CM | POA: Diagnosis not present

## 2023-01-19 DIAGNOSIS — M25552 Pain in left hip: Secondary | ICD-10-CM | POA: Diagnosis not present

## 2023-01-19 DIAGNOSIS — M25551 Pain in right hip: Secondary | ICD-10-CM

## 2023-01-19 NOTE — Therapy (Signed)
OUTPATIENT PHYSICAL THERAPY THORACOLUMBAR TREATMENT   Patient Name: Kaley Jutras MRN: [id number] DOB:30-Jul-1951, 72 y.o., female Today's Date: 01/19/2023  END OF SESSION:  PT End of Session - 01/19/23 1333     Visit Number 6    Number of Visits 16    Date for PT Re-Evaluation 02/16/23    Authorization Type [employer]    PT Start Time 1104    PT Stop Time 1145    PT Time Calculation (min) 41 min    Activity Tolerance Patient tolerated treatment well    Behavior During Therapy [facility] for tasks assessed/performed               Past Medical History:  Diagnosis Date   ACE-inhibitor cough 11/25/2020   Allergy    seasonal   Arthritis    Basal cell carcinoma of skin 02/29/2012   basal cell carcinoma on her left melolabial fold approximately 30 years  ago   Diverticulitis    Essential hypertension 03/29/2019   GERD (gastroesophageal reflux disease) 03/29/2019   Chronic, normal EGD. 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   Primary insomnia 11/16/2017   Seasonal allergic rhinitis due to pollen 03/29/2019   Past Surgical History:  Procedure Laterality Date   CHOLECYSTECTOMY     COLONOSCOPY     x2   NASAL SINUS SURGERY     THYROIDECTOMY Right 12/08/2021   Procedure: THYROID LOBECTOMY;  Surgeon: Serena Colonel, MD;  Location: [facility] OR;  Service: ENT;  Laterality: Right;   UPPER GASTROINTESTINAL ENDOSCOPY     Patient Active Problem List   Diagnosis Date Noted   Spondylosis of cervical 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 myelopathy or radiculopathy 12/02/2022   Globus sensation 10/19/2022   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   ACE-inhibitor cough 11/25/2020   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   Primary localized osteoarthrosis of multiple sites 09/04/2019   Chronic allergic rhinitis 09/04/2019   Essential hypertension 03/29/2019   Mild intermittent  asthma without complication 03/29/2019   Dry eye syndrome 03/29/2019   Family history of premature CAD 03/29/2019   GAD (generalized anxiety disorder) 04/13/2018   Mixed hyperlipidemia 04/13/2018   Primary insomnia 11/16/2017   Gastroesophageal reflux disease 10/29/2015   Postmenopausal atrophic vaginitis 03/13/2013   Dyspareunia 03/13/2013   Basal cell carcinoma of skin 02/29/2012    PCP: Asencion Partridge   REFERRING PROVIDER: [name]  REFERRING DIAG: thoracic pain, neck pain, rhomboid strain,   Rationale for Evaluation and Treatment: Rehabilitation  THERAPY DIAG:  Cervicalgia  Pain in thoracic spine  Bilateral hip pain  ONSET DATE:   SUBJECTIVE:  SUBJECTIVE STATEMENT: Pt doing pretty well. States back has been better, feels she is controlling it better, is able to get it to stop hurting with a couple stretches. States hip are still sore when she is getting up from being seated for a while.   Eval: Pt has had ongoing pain in thoracic/shoulder blade area for a long time. She was seen in PT in previous years, reporting some improvement at that time. She has been caring for her sick husband, increased lifting, etc, husband now has passed away. She did take round of prednisone about 1 month ago, did not have lasting effects. She is unable to take Nsaids, tylenol does not help much.  States Arthritis in neck, some pain, stiffness. Most bothersome pain is in back/shoulder blades, reports some improvement with activity.  She is Also having pain in bil gr trochanters, sore with getting up,  More with standing  She is Walking some for exercise, but limited due to pain.    PERTINENT HISTORY: diverticulitis, thyroid, HTN, OA,    PAIN:  Are you having pain? Yes: NPRS scale: 0-4/10 Pain location:  thoracic  Pain description: reaching, carrying, lifting  Aggravating factors: Deep breath,  Relieving factors: movement   Are you having pain? Yes: NPRS scale: 0-4/10 Pain location: neck Pain description: stiff, tight Aggravating factors: movement  Relieving factors: none stated   Are you having pain? Yes: NPRS scale: 0-5/10 Pain location: bil hips Pain description: sore Aggravating factors: increased activity  Relieving factors: none stated   PRECAUTIONS: None  WEIGHT BEARING RESTRICTIONS: No  FALLS:  Has patient fallen in last 6 months? No   PLOF: Independent  PATIENT GOALS:  decreased pain in t-spine, hips,   NEXT MD VISIT:   OBJECTIVE:   DIAGNOSTIC FINDINGS:    PATIENT SURVEYS:   COGNITION: Overall cognitive status: Within functional limits for tasks assessed     SENSATION:  POSTURE:   PALPATION: Central t-spine/mid.. And L, into Lateral ribs with breathing Tender in R rhomboid Tight in bil UT s,  Tender bil gr troch. Mild tightness and tenderness in bil cervical paraspinals,   ROM:  Neck: Flex, Rot: mild limitation:  Ext: mild limitaiton and pain  Shoulder: WNL Hips: WFL, mild limitation for ER, IR bil  Thoracic: mild limitation for all motions   LOWER EXTREMITY MMT:    MMT Right eval Left eval  Hip flexion 4 4  Hip extension    Hip abduction 4- 4-  Hip adduction    Hip internal rotation    Hip external rotation 4 4  Knee flexion 5 5  Knee extension 5 5  Ankle dorsiflexion    Ankle plantarflexion    Ankle inversion    Ankle eversion     (Blank rows = not tested)  LUMBAR SPECIAL TESTS:   FUNCTIONAL TESTS:    TODAY'S TREATMENT:  DATE:   01/19/23: Therapeutic Exercise: Aerobic:  UBE L 1 x  4 min, fwd and bwd.  Supine:   Bridging 2 x 10;   S/L:  Seated:   Standing:   Bicep curls 4 lb bil x 2 x 10;  Rows  BlueTB x 15 with cuing for back posture;   Wall push ups 2 x 10;  Hip abd 2 x 10 bil; lateral step ups 6 in x 10 bil;  Heel raises x 20; Slow march x 20 for stability;  Up/down 5 steps, no Hand rail x 5  Stretches:   Neuromuscular Re-education: Manual Therapy:   Self Care:  01/07/23: Therapeutic Exercise: Aerobic:  UBE L 1 x 4;  Supine:  SLR 2 x 10 bil;   Bridging 2 x 10;  SA presses 2 x 10;   S/L:  Seated:   Standing:   Bicep curls 4 lb bil x 2 x 10;  Rows GTB x 15 with cuing for back posture; Chest press with TA x 20 GTB;   Wall push ups 2 x 10;  Hip abd 2 x 10 bil;  Stretches:  LTR x 10;  Standing side bending x 3 bil;   Neuromuscular Re-education: Manual Therapy:   Self Care:   01/05/23: Therapeutic Exercise: Aerobic: Supine:  SLR 2 x 10 bil;  Bridging 2 x 10;  S/L:  hip abd: 2x 10 bil;  Seated:   Standing:  Bent over row 4lb bil x 10;  Rows GTB x 15 with cuing for back posture; Chest press with TA x 20 RTB; Wall push ups 2 x 10;  Stretches:  LTR x 10;  Standing side bending x 3 bil;   Neuromuscular Re-education: Manual Therapy: thoracic PA mobs, DTM to R mid/lower thoracic paraspinals.  Self Care:   12/29/22: Therapeutic Exercise: Aerobic: Supine:  SLR 2 x 10 bil;  SA presses 3 lb x 15 bil;  Chest press 3 lb x 15; Horiz abd x 15 bil;  S/L:  hip abd: 2x 10 bil;  Seated:  Sit to stand 3 x 5 with education on trunk posture and TA;  Standing:  Shoulder Flexion/Arom x 15 at wall with focus on back posture; Bent over row 3lb bil x 15;  Rows GTB x 15 with cuing for back posture;  Stretches:  LTR x 10;  Standing side bending x 3 bil;   Neuromuscular Re-education: Manual Therapy: thoracic PA mobs, TPR to R levator and rhomoid/tennis ball.  Self Care:    PATIENT EDUCATION:  Education details: Updated and reviewed HEP Person educated: Patient Education method: Explanation, Demonstration, Tactile cues, Verbal cues, and Handouts Education comprehension: verbalized  understanding, returned demonstration, verbal cues required, tactile cues required, and needs further education   HOME EXERCISE PROGRAM: Access Code: [email or web address]  ASSESSMENT:  CLINICAL IMPRESSION: Pt progressing well with thoracic pain. She does require mod cuing for upright posture and core activation with activities. She continues to have mild soreness in hips, and will benefit from continued strengthening. Noted instability in feet today, with trail for slow march with SLS. Also challenged with stability for stair climbing without UE support. Reviewed previous foot pain, shoes and orthotics. She will update her footwear and possibly update orthotic. Current insert likely too high of arch support. Reviewed strengthening for foot/ankle. Pt to benefit from continued care.   Eval: Patient presents with primary complaint of increased pain in thoracic region. She has stiffness in t-spine, with increased muscle tension in scapular region. She  has decreased ability for lift, carry, and IADls with UEs due to pain. She also has mild muscle tension, stiffness, and pain in neck, consistent with OA. She has bil hip pain in gr trochanters, and will benefit from strengthening for improving stability and ability for walking, stairs, and functional actvitiy. Pt to benefit from skilled PT to improve deficits and pain.   OBJECTIVE IMPAIRMENTS: decreased activity tolerance, decreased balance, decreased knowledge of use of DME, decreased mobility, difficulty walking, decreased ROM, decreased strength, increased muscle spasms, impaired flexibility, impaired UE functional use, improper body mechanics, and pain.   ACTIVITY LIMITATIONS: carrying, lifting, bending, standing, squatting, stairs, reach over head, hygiene/grooming, locomotion level, and caring for others  PARTICIPATION LIMITATIONS: meal prep, cleaning, laundry, shopping, and community activity  PERSONAL FACTORS:  none  are also affecting patient's functional  outcome.   REHAB POTENTIAL: Good  CLINICAL DECISION MAKING: Stable/uncomplicated  EVALUATION COMPLEXITY: Low   GOALS: Goals reviewed with patient? Yes  SHORT TERM GOALS: Target date: 01/05/2023   Pt to be independent with initial HEP  Goal status: INITIAL  2.  Pt to demo improved cervical flex and rot ROM to be WFL/pain free.   Goal status: INITIAL     LONG TERM GOALS: Target date: 02/16/2023  Pt to be independent with final HEP  Goal status: INITIAL  2.  Pt to report decreased pain in t-spine to 0-2/10 with activity, IADLS, and lifting.   Goal status: INITIAL  3.  Pt to report decreased pain in bil hips to 0-2/10 with standing, walking and activity for at least 30 min.   Goal status: INITIAL  4.  Pt to demo improved strength of hips to at least 4+/5 to improve stability and pain.    Goal status: INITIAL    PLAN:  PT FREQUENCY: 1-2x/week  PT DURATION: 8 weeks  PLANNED INTERVENTIONS: Therapeutic exercises, Therapeutic activity, Neuromuscular re-education, Patient/Family education, Self Care, Joint mobilization, Joint manipulation, Stair training, Orthotic/Fit training, DME instructions, Aquatic Therapy, Dry Needling, Electrical stimulation, Cryotherapy, Moist heat, Taping, Ultrasound, Ionotophoresis 4mg /ml Dexamethasone, Manual therapy,  Vasopneumatic device, Traction, Spinal manipulation, Spinal mobilization,Balance training, Gait training,   PLAN FOR NEXT SESSION: pt will get new sneakers, possibly new orthotic, review foot/ankle stability/HR,  stairs without UE support, and hip strength. Pt with mild hesitation for initial contact with gait due to previous/ongoing foot pain.    Sedalia Muta, PT, DPT 1:34 PM  01/19/23

## 2023-01-20 ENCOUNTER — Encounter (HOSPITAL_COMMUNITY): Payer: Self-pay

## 2023-01-20 ENCOUNTER — Ambulatory Visit (HOSPITAL_COMMUNITY): Admit: 2023-01-20 | Payer: Medicare PPO | Admitting: Internal Medicine

## 2023-01-20 SURGERY — MANOMETRY, ESOPHAGUS

## 2023-01-21 ENCOUNTER — Encounter: Payer: Medicare PPO | Admitting: Physical Therapy

## 2023-01-21 DIAGNOSIS — H1013 Acute atopic conjunctivitis, bilateral: Secondary | ICD-10-CM | POA: Diagnosis not present

## 2023-01-21 DIAGNOSIS — H16223 Keratoconjunctivitis sicca, not specified as Sjogren's, bilateral: Secondary | ICD-10-CM | POA: Diagnosis not present

## 2023-01-22 ENCOUNTER — Ambulatory Visit: Payer: Medicare PPO | Admitting: Podiatry

## 2023-01-25 ENCOUNTER — Ambulatory Visit: Payer: Medicare PPO | Admitting: Podiatry

## 2023-01-26 ENCOUNTER — Encounter: Payer: Self-pay | Admitting: Physical Therapy

## 2023-01-26 ENCOUNTER — Ambulatory Visit: Payer: Medicare PPO | Admitting: Physical Therapy

## 2023-01-26 ENCOUNTER — Other Ambulatory Visit: Payer: Self-pay | Admitting: Family Medicine

## 2023-01-26 DIAGNOSIS — M546 Pain in thoracic spine: Secondary | ICD-10-CM | POA: Diagnosis not present

## 2023-01-26 DIAGNOSIS — M542 Cervicalgia: Secondary | ICD-10-CM

## 2023-01-26 DIAGNOSIS — M25551 Pain in right hip: Secondary | ICD-10-CM

## 2023-01-26 DIAGNOSIS — M25552 Pain in left hip: Secondary | ICD-10-CM

## 2023-01-26 NOTE — Therapy (Signed)
OUTPATIENT PHYSICAL THERAPY THORACOLUMBAR TREATMENT   Patient Name: Tina Kelley MRN: [id number] DOB:July 27, 1951, 72 y.o., female Today's Date: 01/26/2023  END OF SESSION:  PT End of Session - 01/26/23 1029     Visit Number 7    Number of Visits 16    Date for PT Re-Evaluation 02/16/23    Authorization Type [employer]    PT Start Time 1024    PT Stop Time 1102    PT Time Calculation (min) 38 min    Activity Tolerance Patient tolerated treatment well    Behavior During Therapy WFL for tasks assessed/performed               Past Medical History:  Diagnosis Date   ACE-inhibitor cough 11/25/2020   Allergy    seasonal   Arthritis    Basal cell carcinoma of skin 02/29/2012   basal cell carcinoma on her left melolabial fold approximately 30 years  ago   Diverticulitis    Essential hypertension 03/29/2019   GERD (gastroesophageal reflux disease) 03/29/2019   Chronic, normal EGD. 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   Primary insomnia 11/16/2017   Seasonal allergic rhinitis due to pollen 03/29/2019   Past Surgical History:  Procedure Laterality Date   CHOLECYSTECTOMY     COLONOSCOPY     x2   NASAL SINUS SURGERY     THYROIDECTOMY Right 12/08/2021   Procedure: THYROID LOBECTOMY;  Surgeon: Serena Colonel, MD;  Location: [facility] OR;  Service: ENT;  Laterality: Right;   UPPER GASTROINTESTINAL ENDOSCOPY     Patient Active Problem List   Diagnosis Date Noted   Spondylosis of cervical 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 myelopathy or radiculopathy 12/02/2022   Globus sensation 10/19/2022   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   ACE-inhibitor cough 11/25/2020   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   Primary localized osteoarthrosis of multiple sites 09/04/2019   Chronic allergic rhinitis 09/04/2019   Essential hypertension 03/29/2019   Mild intermittent  asthma without complication 03/29/2019   Dry eye syndrome 03/29/2019   Family history of premature CAD 03/29/2019   GAD (generalized anxiety disorder) 04/13/2018   Mixed hyperlipidemia 04/13/2018   Primary insomnia 11/16/2017   Gastroesophageal reflux disease 10/29/2015   Postmenopausal atrophic vaginitis 03/13/2013   Dyspareunia 03/13/2013   Basal cell carcinoma of skin 02/29/2012    PCP: Asencion Partridge   REFERRING PROVIDER: [name]  REFERRING DIAG: thoracic pain, neck pain, rhomboid strain,   Rationale for Evaluation and Treatment: Rehabilitation  THERAPY DIAG:  Cervicalgia  Pain in thoracic spine  Bilateral hip pain  ONSET DATE:   SUBJECTIVE:  SUBJECTIVE STATEMENT: Pt doing pretty well. States minimal pain in back. Does feel "stiff" after sitting for a while. Hip also stiff when getting up from seated position. Did get new shoes, wearing with good comfort.   Eval: Pt has had ongoing pain in thoracic/shoulder blade area for a long time. She was seen in PT in previous years, reporting some improvement at that time. She has been caring for her sick husband, increased lifting, etc, husband now has passed away. She did take round of prednisone about 1 month ago, did not have lasting effects. She is unable to take Nsaids, tylenol does not help much.  States Arthritis in neck, some pain, stiffness. Most bothersome pain is in back/shoulder blades, reports some improvement with activity.  She is Also having pain in bil gr trochanters, sore with getting up,  More with standing  She is Walking some for exercise, but limited due to pain.    PERTINENT HISTORY: diverticulitis, thyroid, HTN, OA,    PAIN:  Are you having pain? Yes: NPRS scale: 0-4/10 Pain location: thoracic  Pain description:  reaching, carrying, lifting  Aggravating factors: Deep breath,  Relieving factors: movement   Are you having pain? Yes: NPRS scale: 0-4/10 Pain location: neck Pain description: stiff, tight Aggravating factors: movement  Relieving factors: none stated   Are you having pain? Yes: NPRS scale: 0-5/10 Pain location: bil hips Pain description: sore Aggravating factors: increased activity  Relieving factors: none stated   PRECAUTIONS: None  WEIGHT BEARING RESTRICTIONS: No  FALLS:  Has patient fallen in last 6 months? No   PLOF: Independent  PATIENT GOALS:  decreased pain in t-spine, hips,   NEXT MD VISIT:   OBJECTIVE:   DIAGNOSTIC FINDINGS:    PATIENT SURVEYS:   COGNITION: Overall cognitive status: Within functional limits for tasks assessed     SENSATION:  POSTURE:   PALPATION: Central t-spine/mid.. And L, into Lateral ribs with breathing Tender in R rhomboid Tight in bil UT s,  Tender bil gr troch. Mild tightness and tenderness in bil cervical paraspinals,   ROM:  Neck: Flex, Rot: mild limitation:  Ext: mild limitaiton and pain  Shoulder: WNL Hips: WFL, mild limitation for ER, IR bil  Thoracic: mild limitation for all motions   LOWER EXTREMITY MMT:    MMT Right eval Left eval  Hip flexion 4 4  Hip extension    Hip abduction 4- 4-  Hip adduction    Hip internal rotation    Hip external rotation 4 4  Knee flexion 5 5  Knee extension 5 5  Ankle dorsiflexion    Ankle plantarflexion    Ankle inversion    Ankle eversion     (Blank rows = not tested)  LUMBAR SPECIAL TESTS:   FUNCTIONAL TESTS:    TODAY'S TREATMENT:  DATE:   01/26/23: Therapeutic Exercise: Aerobic:  UBE L 1 x  4 min, fwd and bwd.  Supine:   Bridging 2 x 10;   S/L: hip abd x 15 on R (education on form)  Seated:  education and practice for optimal seated  posture, for decreased t-spine pain  Standing:   Wall push ups 2 x 10;  Hip abd and ext 2 x 10 bil;  Heel raises x 20; Slow march 2  x 20 for stability;  farmer carry- 8lb x 4 laps ea on L and R;  Stretches:   Neuromuscular Re-education: Manual Therapy:   Self Care:   01/19/23: Therapeutic Exercise: Aerobic:  UBE L 1 x  4 min, fwd and bwd.  Supine:   Bridging 2 x 10;   S/L:  Seated:   Standing:   Bicep curls 4 lb bil x 2 x 10;  Rows BlueTB x 15 with cuing for back posture;   Wall push ups 2 x 10;  Hip abd 2 x 10 bil; lateral step ups 6 in x 10 bil;  Heel raises x 20; Slow march x 20 for stability;  Up/down 5 steps, no Hand rail x 5  Stretches:   Neuromuscular Re-education: Manual Therapy:   Self Care:  01/07/23: Therapeutic Exercise: Aerobic:  UBE L 1 x 4;  Supine:  SLR 2 x 10 bil;   Bridging 2 x 10;  SA presses 2 x 10;   S/L:  Seated:   Standing:   Bicep curls 4 lb bil x 2 x 10;  Rows GTB x 15 with cuing for back posture; Chest press with TA x 20 GTB;   Wall push ups 2 x 10;  Hip abd 2 x 10 bil;  Stretches:  LTR x 10;  Standing side bending x 3 bil;   Neuromuscular Re-education: Manual Therapy:   Self Care:   PATIENT EDUCATION:  Education details: Updated and reviewed HEP Person educated: Patient Education method: Explanation, Demonstration, Tactile cues, Verbal cues, and Handouts Education comprehension: verbalized understanding, returned demonstration, verbal cues required, tactile cues required, and needs further education   HOME EXERCISE PROGRAM: Access Code: [email or web address]  ASSESSMENT:  CLINICAL IMPRESSION: Pt progressing well with thoracic pain. Discussed optimal seated posture today, pt will practice this at home this week. She is doing well with hip strength and pain, still with mild strength and stability deficits, will benefit from continued work on this with HEP. Likely d/c next visit, will review HEP.   Eval: Patient presents with primary complaint of increased  pain in thoracic region. She has stiffness in t-spine, with increased muscle tension in scapular region. She has decreased ability for lift, carry, and IADls with UEs due to pain. She also has mild muscle tension, stiffness, and pain in neck, consistent with OA. She has bil hip pain in gr trochanters, and will benefit from strengthening for improving stability and ability for walking, stairs, and functional actvitiy. Pt to benefit from skilled PT to improve deficits and pain.   OBJECTIVE IMPAIRMENTS: decreased activity tolerance, decreased balance, decreased knowledge of use of DME, decreased mobility, difficulty walking, decreased ROM, decreased strength, increased muscle spasms, impaired flexibility, impaired UE functional use, improper body mechanics, and pain.   ACTIVITY LIMITATIONS: carrying, lifting, bending, standing, squatting, stairs, reach over head, hygiene/grooming, locomotion level, and caring for others  PARTICIPATION LIMITATIONS: meal prep, cleaning, laundry, shopping, and community activity  PERSONAL FACTORS:  none  are also affecting patient's functional outcome.   REHAB  POTENTIAL: Good  CLINICAL DECISION MAKING: Stable/uncomplicated  EVALUATION COMPLEXITY: Low   GOALS: Goals reviewed with patient? Yes  SHORT TERM GOALS: Target date: 01/05/2023   Pt to be independent with initial HEP  Goal status: INITIAL  2.  Pt to demo improved cervical flex and rot ROM to be WFL/pain free.   Goal status: INITIAL     LONG TERM GOALS: Target date: 02/16/2023  Pt to be independent with final HEP  Goal status: INITIAL  2.  Pt to report decreased pain in t-spine to 0-2/10 with activity, IADLS, and lifting.   Goal status: INITIAL  3.  Pt to report decreased pain in bil hips to 0-2/10 with standing, walking and activity for at least 30 min.   Goal status: INITIAL  4.  Pt to demo improved strength of hips to at least 4+/5 to improve stability and pain.    Goal status:  INITIAL    PLAN:  PT FREQUENCY: 1-2x/week  PT DURATION: 8 weeks  PLANNED INTERVENTIONS: Therapeutic exercises, Therapeutic activity, Neuromuscular re-education, Patient/Family education, Self Care, Joint mobilization, Joint manipulation, Stair training, Orthotic/Fit training, DME instructions, Aquatic Therapy, Dry Needling, Electrical stimulation, Cryotherapy, Moist heat, Taping, Ultrasound, Ionotophoresis 4mg /ml Dexamethasone, Manual therapy,  Vasopneumatic device, Traction, Spinal manipulation, Spinal mobilization,Balance training, Gait training,   PLAN FOR NEXT SESSION:  stairs without UE support, Pt with mild hesitation for initial contact with gait due to previous/ongoing foot pain. , hip abd and ext strength, discuss seated posture at home. D/c   Sedalia Muta, PT, DPT 1:19 PM  01/26/23

## 2023-01-29 ENCOUNTER — Other Ambulatory Visit: Payer: Self-pay | Admitting: Internal Medicine

## 2023-02-04 ENCOUNTER — Encounter: Payer: Self-pay | Admitting: Physical Therapy

## 2023-02-04 ENCOUNTER — Ambulatory Visit: Payer: Medicare PPO | Admitting: Physical Therapy

## 2023-02-04 DIAGNOSIS — M25551 Pain in right hip: Secondary | ICD-10-CM | POA: Diagnosis not present

## 2023-02-04 DIAGNOSIS — M25552 Pain in left hip: Secondary | ICD-10-CM

## 2023-02-04 DIAGNOSIS — M546 Pain in thoracic spine: Secondary | ICD-10-CM | POA: Diagnosis not present

## 2023-02-04 DIAGNOSIS — M542 Cervicalgia: Secondary | ICD-10-CM | POA: Diagnosis not present

## 2023-02-04 NOTE — Therapy (Signed)
OUTPATIENT PHYSICAL THERAPY THORACOLUMBAR TREATMENT   Patient Name: Tina Kelley MRN: [id number] DOB:[birth year], 72 y.o., female Today's Date: 02/04/2023  END OF SESSION:  PT End of Session - 02/04/23 1302     Visit Number 8    Number of Visits 16    Date for PT Re-Evaluation 02/16/23    Authorization Type [employer]    PT Start Time 1305    PT Stop Time 1343    PT Time Calculation (min) 38 min    Activity Tolerance Patient tolerated treatment well    Behavior During Therapy [facility] for tasks assessed/performed               Past Medical History:  Diagnosis Date   ACE-inhibitor cough 11/25/2020   Allergy    seasonal   Arthritis    Basal cell carcinoma of skin 02/29/2012   basal cell carcinoma on her left melolabial fold approximately 30 years  ago   Diverticulitis    Essential hypertension 03/29/2019   GERD (gastroesophageal reflux disease) 03/29/2019   Chronic, normal EGD. 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   Primary insomnia 11/16/2017   Seasonal allergic rhinitis due to pollen 03/29/2019   Past Surgical History:  Procedure Laterality Date   CHOLECYSTECTOMY     COLONOSCOPY     x2   NASAL SINUS SURGERY     THYROIDECTOMY Right 12/08/2021   Procedure: THYROID LOBECTOMY;  Surgeon: Serena Colonel, MD;  Location: [facility] OR;  Service: ENT;  Laterality: Right;   UPPER GASTROINTESTINAL ENDOSCOPY     Patient Active Problem List   Diagnosis Date Noted   Spondylosis of cervical 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 myelopathy or radiculopathy 12/02/2022   Globus sensation 10/19/2022   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   ACE-inhibitor cough 11/25/2020   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   Primary localized osteoarthrosis of multiple sites 09/04/2019   Chronic allergic rhinitis 09/04/2019   Essential hypertension 03/29/2019   Mild intermittent  asthma without complication 03/29/2019   Dry eye syndrome 03/29/2019   Family history of premature CAD 03/29/2019   GAD (generalized anxiety disorder) 04/13/2018   Mixed hyperlipidemia 04/13/2018   Primary insomnia 11/16/2017   Gastroesophageal reflux disease 10/29/2015   Postmenopausal atrophic vaginitis 03/13/2013   Dyspareunia 03/13/2013   Basal cell carcinoma of skin 02/29/2012    PCP: Asencion Partridge   REFERRING PROVIDER: [name]  REFERRING DIAG: thoracic pain, neck pain, rhomboid strain,   Rationale for Evaluation and Treatment: Rehabilitation  THERAPY DIAG:  Cervicalgia  Pain in thoracic spine  Bilateral hip pain  ONSET DATE:   SUBJECTIVE:  SUBJECTIVE STATEMENT: Pt states back doing very well. She was doing ok, but did increased activity in last couple days, and R hip is now quite sore. Thinks it is a little better today.   Eval: Pt has had ongoing pain in thoracic/shoulder blade area for a long time. She was seen in PT in previous years, reporting some improvement at that time. She has been caring for her sick husband, increased lifting, etc, husband now has passed away. She did take round of prednisone about 1 month ago, did not have lasting effects. She is unable to take Nsaids, tylenol does not help much.  States Arthritis in neck, some pain, stiffness. Most bothersome pain is in back/shoulder blades, reports some improvement with activity.  She is Also having pain in bil gr trochanters, sore with getting up,  More with standing  She is Walking some for exercise, but limited due to pain.    PERTINENT HISTORY: diverticulitis, thyroid, HTN, OA,    PAIN:  Are you having pain? Yes: NPRS scale: 0-4/10 Pain location: thoracic  Pain description: reaching, carrying, lifting   Aggravating factors: Deep breath,  Relieving factors: movement   Are you having pain? Yes: NPRS scale: 0-2/10 Pain location: neck Pain description: stiff, tight Aggravating factors: movement  Relieving factors: none stated   Are you having pain? Yes: NPRS scale: 0-5/10 Pain location: bil hips Pain description: sore Aggravating factors: increased activity  Relieving factors: none stated   PRECAUTIONS: None  WEIGHT BEARING RESTRICTIONS: No  FALLS:  Has patient fallen in last 6 months? No   PLOF: Independent  PATIENT GOALS:  decreased pain in t-spine, hips,   NEXT MD VISIT:   OBJECTIVE:  updated 02/04/23  DIAGNOSTIC FINDINGS:    PATIENT SURVEYS:   COGNITION: Overall cognitive status: Within functional limits for tasks assessed     SENSATION:  POSTURE:   PALPATION:   ROM:  Neck: Flex, Rot: WFL:  Ext: mild limitaiton  Shoulder: WNL Hips: WFL Thoracic: mild limitation for extension    LOWER EXTREMITY MMT:    MMT Right eval Left eval R 02/04/23 L 02/04/23  Hip flexion 4 4 4+ 4+  Hip extension      Hip abduction 4- 4- 4+ 4+  Hip adduction      Hip internal rotation      Hip external rotation 4 4 4+ 4+  Knee flexion 5 5    Knee extension 5 5    Ankle dorsiflexion      Ankle plantarflexion      Ankle inversion      Ankle eversion       (Blank rows = not tested)  LUMBAR SPECIAL TESTS:   FUNCTIONAL TESTS:    TODAY'S TREATMENT:                                                                                                                              DATE:   02/04/23: Therapeutic Exercise: Aerobic:  Supine:   Bridging 2 x 10;   S/L: hip abd 2 x 10 on R  Seated:   Standing:    Hip abd and ext 2 x 10 bil;  Rows GTB x 20;  Stretches:  piriformis on R 30 sec x 3;  Neuromuscular Re-education: Manual Therapy:  STM/ISTM to R lateral hip/gr troch  Self Care:   01/26/23: Therapeutic Exercise: Aerobic:  UBE L 1 x  4 min, fwd and bwd.  Supine:    Bridging 2 x 10;   S/L: hip abd x 15 on R (education on form)  Seated:  education and practice for optimal seated posture, for decreased t-spine pain  Standing:   Wall push ups 2 x 10;  Hip abd and ext 2 x 10 bil;  Heel raises x 20; Slow march 2  x 20 for stability;  farmer carry- 8lb x 4 laps ea on L and R;  Stretches:   Neuromuscular Re-education: Manual Therapy:   Self Care:   PATIENT EDUCATION:  Education details: Updated and reviewed HEP Person educated: Patient Education method: Explanation, Demonstration, Tactile cues, Verbal cues, and Handouts Education comprehension: verbalized understanding, returned demonstration, verbal cues required, tactile cues required, and needs further education   HOME EXERCISE PROGRAM: Access Code: [email or web address]  ASSESSMENT:  CLINICAL IMPRESSION: Pt has made good progress thus far. She is doing much better with neck and back pain. Hips were also doing very good, but pt with increased soreness today from increased activity in last few days. No significant pain with activities today, she was able to perform strengthening with no pain in hips. Education on continuing strength and mobility as able. If pain does not subside, she will f/u with sports med. Pt feels ready for d/c today. Will hold at this time, and pt will return in a week or two if hip sill sore, otherwise will d/c. Final HEP reviewed today.   Eval: Patient presents with primary complaint of increased pain in thoracic region. She has stiffness in t-spine, with increased muscle tension in scapular region. She has decreased ability for lift, carry, and IADls with UEs due to pain. She also has mild muscle tension, stiffness, and pain in neck, consistent with OA. She has bil hip pain in gr trochanters, and will benefit from strengthening for improving stability and ability for walking, stairs, and functional actvitiy. Pt to benefit from skilled PT to improve deficits and pain.   OBJECTIVE IMPAIRMENTS:  decreased activity tolerance, decreased balance, decreased knowledge of use of DME, decreased mobility, difficulty walking, decreased ROM, decreased strength, increased muscle spasms, impaired flexibility, impaired UE functional use, improper body mechanics, and pain.   ACTIVITY LIMITATIONS: carrying, lifting, bending, standing, squatting, stairs, reach over head, hygiene/grooming, locomotion level, and caring for others  PARTICIPATION LIMITATIONS: meal prep, cleaning, laundry, shopping, and community activity  PERSONAL FACTORS:  none  are also affecting patient's functional outcome.   REHAB POTENTIAL: Good  CLINICAL DECISION MAKING: Stable/uncomplicated  EVALUATION COMPLEXITY: Low   GOALS: Goals reviewed with patient? Yes  SHORT TERM GOALS: Target date: 01/05/2023   Pt to be independent with initial HEP  Goal status: MET  2.  Pt to demo improved cervical flex and rot ROM to be WFL/pain free.   Goal status: MET     LONG TERM GOALS: Target date: 02/16/2023  Pt to be independent with final HEP  Goal status: MET  2.  Pt to report decreased pain in t-spine to 0-2/10 with activity, IADLS, and lifting.  Goal status: MET  3.  Pt to report decreased pain in bil hips to 0-2/10 with standing, walking and activity for at least 30 min.   Goal status: IN PROGRESS  4.  Pt to demo improved strength of hips to at least 4+/5 to improve stability and pain.    Goal status: MET    PLAN:  PT FREQUENCY: 1-2x/week  PT DURATION: 8 weeks  PLANNED INTERVENTIONS: Therapeutic exercises, Therapeutic activity, Neuromuscular re-education, Patient/Family education, Self Care, Joint mobilization, Joint manipulation, Stair training, Orthotic/Fit training, DME instructions, Aquatic Therapy, Dry Needling, Electrical stimulation, Cryotherapy, Moist heat, Taping, Ultrasound, Ionotophoresis 4mg /ml Dexamethasone, Manual therapy,  Vasopneumatic device, Traction, Spinal manipulation, Spinal  mobilization,Balance training, Gait training,   PLAN FOR NEXT SESSION:    Sedalia Muta, PT, DPT 2:39 PM  02/04/23  PHYSICAL THERAPY DISCHARGE SUMMARY  Visits from Start of Care: 8 Plan: Patient agrees to discharge.  Patient goals were  met. Patient is being discharged due to meeting the stated rehab goals.     Sedalia Muta, PT, DPT 2:47 PM  02/04/23

## 2023-02-05 ENCOUNTER — Encounter: Payer: Self-pay | Admitting: Family Medicine

## 2023-02-05 ENCOUNTER — Ambulatory Visit: Payer: Medicare PPO | Admitting: Family Medicine

## 2023-02-05 VITALS — BP 104/74 | HR 64 | Temp 97.2°F | Ht 65.0 in | Wt 142.2 lb

## 2023-02-05 DIAGNOSIS — J309 Allergic rhinitis, unspecified: Secondary | ICD-10-CM

## 2023-02-05 DIAGNOSIS — J029 Acute pharyngitis, unspecified: Secondary | ICD-10-CM

## 2023-02-05 LAB — POCT RAPID STREP A (OFFICE): Rapid Strep A Screen: NEGATIVE

## 2023-02-05 NOTE — Progress Notes (Signed)
Subjective   CC:  Chief Complaint  Patient presents with   Cough    Pt c/o cough that has lingered after sinus issues and throat is irritated from coughing. Sputum is white/clear.    HPI: Tina Kelley is a 72 y.o. female who presents to the office today to address the problems listed above in the chief complaint. Patient complains of typical URI symptoms including nasal congestion, mild sore throat, cough, and mild malaise.  Has chronic allergy sxs and gerd. She denies high fever or productive cough, shortness of breath or significant GI symptoms.  Over-the-counter cold medicines have been minimally or mildly helpful.  Assessment  1. Chronic allergic rhinitis   2. Sore throat      Plan  Allergies plus minus URI, viral: discussed dx; no sign or sx of bacterial infection is present. Treat supportively with antihistamines, decongestants, and/or cough meds. See AVS for care instructions.   Follow up: as needed   Orders Placed This Encounter  Procedures   POCT rapid strep A   No orders of the defined types were placed in this encounter.     I reviewed the patients updated PMH, FH, and SocHx.    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    Priority: Medium    Primary localized osteoarthrosis of multiple sites 09/04/2019    Priority: Medium    Mild intermittent asthma without complication 03/29/2019    Priority: Medium    Gastroesophageal reflux disease 10/29/2015    Priority: Medium    Dyspareunia 03/13/2013    Priority: Medium    Basal cell carcinoma of skin 02/29/2012    Priority: Medium    Lichen sclerosus et atrophicus of the vulva  06/16/2021    Priority: Low   ACE-inhibitor cough 11/25/2020    Priority: Low   Chronic allergic rhinitis 09/04/2019    Priority: Low   Dry eye syndrome 03/29/2019    Priority: Low   Postmenopausal atrophic vaginitis 03/13/2013    Priority: Low   Spondylosis of cervical 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 myelopathy or radiculopathy 12/02/2022   Globus sensation 10/19/2022   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   albuterol (VENTOLIN HFA) 108 (90 Base) MCG/ACT inhaler INHALE 2 PUFFS INTO THE LUNGS EVERY 6 HOURS AS NEEDED FOR WHEEZING OR SHORTNESS OF BREATH   buPROPion (WELLBUTRIN SR) 150 MG 12 hr tablet TAKE ONE TABLET BY MOUTH TWICE A DAY   cholestyramine (QUESTRAN) 4 g packet Take 0.5 packets (2 g total) by mouth 2 (two) times daily as needed.   EPINEPHrine (EPIPEN 2-PAK) 0.3 mg/0.3 mL IJ SOAJ injection Inject 0.3 mg into the muscle as needed for anaphylaxis. Use as instructed.   Lactobacillus Rhamnosus, GG, (CULTURELLE) CAPS Take 1 capsule by mouth daily.   losartan (COZAAR) 50 MG tablet Take 0.5 tablets (25 mg total) by mouth in the morning and at bedtime.   metoprolol succinate (TOPROL-XL) 50 MG 24 hr tablet TAKE ONE TABLET BY MOUTH DAILY WITH  OR IMMEDIATELY FOLLOWING A MEAL   mupirocin ointment (BACTROBAN) 2 % Apply topically 2 (two) times daily.   pantoprazole (PROTONIX) 40 MG tablet TAKE 1 TABLET BY MOUTH TWICE A DAY   Polyvinyl Alcohol-Povidone (REFRESH OP) Place 1 drop into both eyes daily as needed (dry eyes).   RESTASIS 0.05 % ophthalmic emulsion    rosuvastatin (CRESTOR) 10 MG tablet Take 2 tablets (20 mg total) by mouth daily.   sodium chloride (OCEAN) 0.65 % SOLN nasal spray Place 1 spray into both nostrils as needed for congestion.    Review of Systems: Constitutional: Negative for fever malaise or anorexia Cardiovascular: negative for  chest pain Respiratory: negative for SOB or pleuritic chest pain Gastrointestinal: negative for abdominal pain  Objective  Vitals: BP 104/74   Pulse 64   Temp (!) 97.2 F (36.2 C)   Ht 5\' 5"  (1.651 m)   Wt 142 lb 3.2 oz (64.5 kg)   SpO2 99%   BMI 23.66 kg/m  General: no acute respiratory distress  Psych:  Alert and oriented, normal mood and affect HEENT: Normocephalic, nasal congestion present, TMs w/o erythema, OP with erythema w/o exudate, + cervical LAD, supple neck  Cardiovascular:  RRR without murmur or gallop. no peripheral edema Respiratory:  Good breath sounds bilaterally, CTAB with normal respiratory effort Skin:  Warm, no rashes  Office Visit on 02/05/2023  Component Date Value Ref Range Status   Rapid Strep A Screen 02/05/2023 Negative  Negative Final    Commons side effects, risks, benefits, and alternatives for medications and treatment plan prescribed today were discussed, and the patient expressed understanding of the given instructions. Patient is instructed to call or message via MyChart if he/she has any questions or concerns regarding our treatment plan. No barriers to understanding were identified. We discussed Red Flag symptoms and signs in detail. Patient expressed understanding regarding what to do in case of urgent or emergency type symptoms.  Medication list was reconciled, printed and provided to the patient in AVS. Patient instructions and summary information was reviewed with the patient as documented in the AVS. This note was prepared with assistance of Dragon voice recognition software. Occasional wrong-word or sound-a-like substitutions may have occurred due to the inherent limitations of voice recognition software

## 2023-02-08 ENCOUNTER — Ambulatory Visit: Payer: Medicare PPO | Admitting: Podiatry

## 2023-02-08 DIAGNOSIS — L600 Ingrowing nail: Secondary | ICD-10-CM

## 2023-02-08 NOTE — Progress Notes (Signed)
Chief Complaint  Patient presents with   Ingrown Toenail    Patient came in today for left foot hallux ingrown, nail trim     HPI: 72 y.o. female presenting today for follow-up evaluation after total temporary nail avulsion to the left hallux nail plate secondary to traumatic injury.  Patient states that the nail plate is growing out nicely however she does have some pain and tenderness to the medial aspect of the nail plate along the medial nail fold.  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   Primary insomnia 11/16/2017   Seasonal allergic rhinitis due to pollen 03/29/2019    Past Surgical History:  Procedure Laterality Date   CHOLECYSTECTOMY     COLONOSCOPY     x2   NASAL SINUS SURGERY     THYROIDECTOMY Right 12/08/2021   Procedure: THYROID LOBECTOMY;  Surgeon: Serena Colonel, MD;  Location: [facility];  Service: ENT;  Laterality: Right;   UPPER GASTROINTESTINAL ENDOSCOPY      Allergies  Allergen Reactions   Aspirin Other (See Comments)    Asthma   Nsaids Shortness Of Breath   Penicillins Diarrhea    Told as a child   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  Dermatology: Skin is warm, dry and supple bilateral lower extremities. Negative for open lesions or macerations.  There is some slight curvature of the medial nail fold of  the left hallux nail plate which appears to be slightly protruding into the medial nail fold.  There is no purulence or indication of infection.  There is some slight tenderness and sensitivity with palpation however  Vascular: Palpable pedal pulses bilaterally. Capillary refill within normal limits.  Negative for any significant edema or erythema  Neurological: Light touch and protective threshold grossly intact  Musculoskeletal Exam: No pedal deformities noted  Assessment: 1.  Mild ingrown toenail medial border left hallux nail plate -Today we discussed different treatment options both invasive which would include partial nail matricectomy versus simple debridement of the offending border of the nail plate.  For now we are going to pursue simple conservative treatment.  The offending border of the nail plate was debrided using a pair of nail nippers without incident or bleeding.  The patient actually felt significant relief with the simple debridement. -Recommend antibiotic ointment and tissue massage daily to massage the tissue away from the nail plate -Return to clinic as needed      Felecia Shelling, DPM [facility]  Dr. Felecia Shelling, DPM    [address] [place], [place] [address]  Office [phone]  Fax [phone]

## 2023-02-15 ENCOUNTER — Other Ambulatory Visit: Payer: Self-pay

## 2023-02-15 ENCOUNTER — Encounter: Payer: Self-pay | Admitting: Family Medicine

## 2023-02-15 MED ORDER — ROSUVASTATIN CALCIUM 10 MG PO TABS: 20.0000 mg | ORAL_TABLET | Freq: Every day | ORAL | 3 refills | Status: DC

## 2023-02-17 ENCOUNTER — Other Ambulatory Visit: Payer: Self-pay

## 2023-02-17 MED ORDER — ROSUVASTATIN CALCIUM 10 MG PO TABS: 20.0000 mg | ORAL_TABLET | Freq: Every day | ORAL | 3 refills | Status: DC

## 2023-02-18 NOTE — Progress Notes (Signed)
Tina Kelley D.[facility] [address] [place] [address] Phone: [phone]   Assessment and Plan:     1. 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  Tolerated well per note below.  Glucocorticoid may temporarily increase blood pressure in patient with past medical history of hypertension - May continue home activities and physical therapy as tolerated  Procedure: Greater trochanteric bursal injection Side: Right  Risks explained and consent was given verbally. The site was cleaned with alcohol prep. A steroid injection was performed with patient in the lateral side-lying position at area of maximum tenderness over greater trochanter using 2mL of 1% lidocaine without epinephrine and 1mL of kenalog 40mg /ml. This was well tolerated and resulted in symptomatic relief.  Needle was removed, hemostasis achieved, and post injection instructions were explained.  Pt was advised to call or return to clinic if these symptoms worsen or fail to improve as anticipated.     Pertinent previous records reviewed include physical therapy note 02/04/2023, physical therapy note 01/26/2023, physical therapy note 01/19/2023   Follow Up: As needed if no improvement or worsening of symptoms in 2 weeks.  Could consider imaging versus prednisone course versus alternative CSI   Subjective:   I, Tina Kelley, am serving as a Neurosurgeon for Doctor Richardean Sale   Chief Complaint: back pain    HPI:    11/10/2022 Patient is a 72 year old female complaining of back pain. 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 Developed pain of R GT. Pain worse when she gets out of car, stairs and sit to stand. Does have intermittent radiating pain down to her R foot.     Relevant Historical Information: Hypertension, GERD  Additional pertinent review of systems negative.   Current Outpatient Medications:    acetaminophen (TYLENOL) 500 MG tablet, Take 500 mg by mouth every 6 (six) hours as needed for moderate pain., Disp: , Rfl:    albuterol (VENTOLIN HFA) 108 (90 Base) MCG/ACT inhaler, INHALE 2 PUFFS INTO THE LUNGS EVERY 6 HOURS AS NEEDED FOR WHEEZING OR SHORTNESS OF BREATH, Disp: 18 g, Rfl: 1   buPROPion (WELLBUTRIN SR) 150 MG 12 hr tablet, TAKE ONE TABLET BY MOUTH TWICE A DAY, Disp: 180 tablet, Rfl: 3   cholestyramine (QUESTRAN) 4 g packet, Take 0.5 packets (2 g total) by mouth 2 (two) times daily as needed., Disp: 60 each, Rfl: 11   EPINEPHrine (EPIPEN 2-PAK) 0.3 mg/0.3 mL IJ SOAJ injection, Inject 0.3 mg into the muscle as needed for anaphylaxis. Use as instructed., Disp: 1 each, Rfl: 1   Lactobacillus Rhamnosus, GG, (CULTURELLE) CAPS, Take 1 capsule by mouth daily., Disp: , Rfl:    losartan (COZAAR) 50 MG tablet, Take 0.5 tablets (25 mg total) by mouth in the morning and at bedtime., Disp:  90 tablet, Rfl: 3   metoprolol succinate (TOPROL-XL) 50 MG 24 hr tablet, TAKE ONE TABLET BY MOUTH DAILY WITH OR IMMEDIATELY FOLLOWING A MEAL, Disp: 90 tablet, Rfl: 3   mupirocin ointment (BACTROBAN) 2 %, Apply topically 2 (two) times daily., Disp: 15 g, Rfl: 0   pantoprazole (PROTONIX) 40 MG tablet, TAKE 1 TABLET BY MOUTH TWICE A DAY, Disp: 180 tablet, Rfl: 0   Polyvinyl Alcohol-Povidone  (REFRESH OP), Place 1 drop into both eyes daily as needed (dry eyes)., Disp: , Rfl:    RESTASIS 0.05 % ophthalmic emulsion, , Disp: , Rfl:    rosuvastatin (CRESTOR) 10 MG tablet, Take 2 tablets (20 mg total) by mouth daily., Disp: 180 tablet, Rfl: 3   sodium chloride (OCEAN) 0.65 % SOLN nasal spray, Place 1 spray into both nostrils as needed for congestion., Disp: , Rfl:    Objective:     Vitals:   02/19/23 1015  BP: 112/70  Weight: 144 lb (65.3 kg)  Height: 5\' 5"  (1.651 m)      Body mass index is 23.96 kg/m.    Physical Exam:    General: awake, alert, and oriented no acute distress, nontoxic Skin: no suspicious lesions or rashes Neuro:sensation intact distally with no deficits, normal muscle tone, no atrophy, strength 5/5 in all tested lower ext groups Psych: normal mood and affect, speech clear   Right hip: No deformity, swelling or wasting ROM Flexion 90, ext 30, IR 45, ER 45 TTP greater trochanter, gluteal musculature, IT band NTTP over the hip flexors,  , si joint, lumbar spine Negative log roll with FROM Negative FABER Negative FADIR Negative Piriformis test   Gait normal    Electronically signed by:  Tina Kelley D.[facility] 10:32 AM 02/19/23

## 2023-02-19 ENCOUNTER — Ambulatory Visit: Payer: Medicare PPO | Admitting: Sports Medicine

## 2023-02-19 VITALS — BP 112/70 | Ht 65.0 in | Wt 144.0 lb

## 2023-02-19 DIAGNOSIS — M7061 Trochanteric bursitis, right hip: Secondary | ICD-10-CM | POA: Diagnosis not present

## 2023-02-19 NOTE — Patient Instructions (Addendum)
Injected GT today As needed f/u

## 2023-02-20 ENCOUNTER — Encounter: Payer: Self-pay | Admitting: Family Medicine

## 2023-02-23 ENCOUNTER — Other Ambulatory Visit: Payer: Self-pay

## 2023-02-23 DIAGNOSIS — E782 Mixed hyperlipidemia: Secondary | ICD-10-CM

## 2023-02-23 MED ORDER — ROSUVASTATIN CALCIUM 10 MG PO TABS: 20.0000 mg | ORAL_TABLET | Freq: Every day | ORAL | 3 refills | Status: DC

## 2023-03-07 ENCOUNTER — Other Ambulatory Visit: Payer: Self-pay | Admitting: Family Medicine

## 2023-03-08 ENCOUNTER — Encounter: Payer: Self-pay | Admitting: Family Medicine

## 2023-03-08 NOTE — Progress Notes (Unsigned)
[name] [facility] [address] [place] [address] Phone: [phone]   Assessment and Plan:     There are no diagnoses linked to this encounter.  ***   Pertinent previous records reviewed include ***   Follow Up: ***     Subjective:   I, Tina Kelley, am serving as a Neurosurgeon for Doctor Richardean Sale   Chief Complaint: back pain    HPI:    11/10/2022 Patient is a 72 year old female complaining of back pain. 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 Developed pain of R GT. Pain worse when she gets out of car, stairs and sit to stand. Does have intermittent radiating pain down to her R foot.    03/09/2023 Patient states    Relevant Historical Information: Hypertension, GERD  Additional pertinent review of systems negative.   Current Outpatient Medications:    acetaminophen (TYLENOL) 500 MG tablet, Take 500 mg by mouth every 6 (six) hours as needed for moderate pain., Disp: , Rfl:    albuterol (VENTOLIN HFA) 108 (90 Base) MCG/ACT inhaler, INHALE 2 PUFFS INTO THE LUNGS EVERY 6 HOURS AS NEEDED FOR WHEEZING OR SHORTNESS OF BREATH, Disp: 18 g, Rfl: 1   buPROPion (WELLBUTRIN SR) 150 MG 12 hr tablet, TAKE ONE TABLET BY MOUTH TWICE A DAY, Disp: 180 tablet, Rfl: 3   cholestyramine (QUESTRAN) 4 g packet, Take 0.5 packets (2 g total) by mouth 2 (two) times daily as needed., Disp: 60 each, Rfl: 11   EPINEPHrine (EPIPEN 2-PAK) 0.3 mg/0.3 mL IJ SOAJ injection, Inject 0.3 mg into the muscle as needed for anaphylaxis. Use as instructed., Disp: 1 each, Rfl: 1   Lactobacillus  Rhamnosus, GG, (CULTURELLE) CAPS, Take 1 capsule by mouth daily., Disp: , Rfl:    losartan (COZAAR) 50 MG tablet, Take 0.5 tablets (25 mg total) by mouth in the morning and at bedtime., Disp: 90 tablet, Rfl: 3   metoprolol succinate (TOPROL-XL) 50 MG 24 hr tablet, TAKE ONE TABLET BY MOUTH DAILY WITH OR IMMEDIATELY FOLLOWING A MEAL, Disp: 90 tablet, Rfl: 3   mupirocin ointment (BACTROBAN) 2 %, Apply topically 2 (two) times daily., Disp: 15 g, Rfl: 0   pantoprazole (PROTONIX) 40 MG tablet, TAKE 1 TABLET BY MOUTH TWICE A DAY, Disp: 180 tablet, Rfl: 0   Polyvinyl Alcohol-Povidone (REFRESH OP), Place 1 drop into both eyes daily as needed (dry eyes)., Disp: , Rfl:    RESTASIS 0.05 % ophthalmic emulsion, , Disp: , Rfl:    rosuvastatin (CRESTOR) 10 MG tablet, Take 2 tablets (20 mg total) by mouth daily., Disp: 180 tablet, Rfl: 3   sodium chloride (OCEAN) 0.65 % SOLN nasal spray, Place 1 spray into both nostrils as needed for congestion., Disp: , Rfl:    Objective:     There were no vitals filed for this visit.    There is no height or weight on file to calculate BMI.    Physical Exam:    ***   Electronically signed by:  [name] [facility] 11:40 AM  03/08/23 

## 2023-03-09 ENCOUNTER — Ambulatory Visit (INDEPENDENT_AMBULATORY_CARE_PROVIDER_SITE_OTHER): Payer: Medicare PPO

## 2023-03-09 ENCOUNTER — Encounter: Payer: Self-pay | Admitting: Family Medicine

## 2023-03-09 ENCOUNTER — Ambulatory Visit: Payer: Medicare PPO | Admitting: Sports Medicine

## 2023-03-09 VITALS — BP 120/68 | HR 78 | Ht 65.0 in | Wt 146.0 lb

## 2023-03-09 DIAGNOSIS — M7061 Trochanteric bursitis, right hip: Secondary | ICD-10-CM | POA: Diagnosis not present

## 2023-03-09 DIAGNOSIS — M25551 Pain in right hip: Secondary | ICD-10-CM

## 2023-03-09 MED ORDER — ZOLPIDEM TARTRATE 10 MG PO TABS: 10.0000 mg | ORAL_TABLET | Freq: Every evening | ORAL | 5 refills | Status: DC | PRN

## 2023-03-09 MED ORDER — METHYLPREDNISOLONE 4 MG PO TBPK: ORAL_TABLET | ORAL | 0 refills | Status: DC

## 2023-03-09 NOTE — Patient Instructions (Signed)
Prednisone dos pak  PT referral  3 week follow up

## 2023-03-15 ENCOUNTER — Ambulatory Visit: Payer: Medicare PPO | Admitting: Family Medicine

## 2023-03-18 ENCOUNTER — Encounter: Payer: Self-pay | Admitting: Family Medicine

## 2023-03-19 ENCOUNTER — Other Ambulatory Visit: Payer: Self-pay

## 2023-03-19 DIAGNOSIS — M25551 Pain in right hip: Secondary | ICD-10-CM

## 2023-03-29 ENCOUNTER — Ambulatory Visit: Payer: Medicare PPO | Admitting: Sports Medicine

## 2023-03-29 VITALS — BP 122/80 | HR 68 | Ht 65.0 in | Wt 139.0 lb

## 2023-03-29 DIAGNOSIS — M25551 Pain in right hip: Secondary | ICD-10-CM

## 2023-03-29 DIAGNOSIS — M7061 Trochanteric bursitis, right hip: Secondary | ICD-10-CM | POA: Diagnosis not present

## 2023-03-29 MED ORDER — PREDNISONE 10 MG PO TABS
ORAL_TABLET | ORAL | 0 refills | Status: DC
Start: 2023-03-29 — End: 2023-06-09

## 2023-03-29 NOTE — Patient Instructions (Signed)
Prednisone 10 mg daily for 3 weeks  Start PT  Continue HEP  4-5 week follow up

## 2023-03-29 NOTE — Progress Notes (Signed)
Tina Kelley [facility] [address] [place] [address] Phone: [phone]   Assessment and Plan:     1. Greater trochanteric bursitis of right hip 2. Right hip pain -Chronic with exacerbation, subsequent sports medicine visit - Patient had nearly complete resolution of her pain after completing prednisone dosepak and with prior greater trochanteric CSI performed at previous office visit on 02/15/2023, however relief has been temporary with symptoms quickly returning - Patient's etiology of pain is most consistent with greater trochanteric bursitis  - Start prednisone 10mg  daily for 3 weeks - Restart physical therapy and home exercises.      Pertinent previous records reviewed include none   Follow Up: 4-5 weeks for reevaluation. If no improvement, could consider intraarticular hip CSI vs gluteal muscle/tendon CSI     Subjective:   I, Tina Kelley, am serving as a Neurosurgeon for Doctor Richardean Sale   Chief Complaint: back pain    HPI:    11/10/2022 Patient is a 72 year old female complaining of back pain. 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 Developed pain of R GT. Pain worse when she gets out of car, stairs and sit to stand. Does have intermittent radiating pain down to her R foot.    03/09/2023 Patient states felt great for 10 days then the pain came back    03/29/2023 Patient states that the prednisone really helped would like a refill     Relevant Historical Information: Hypertension, GERD  Additional pertinent review of systems negative.   Current  Outpatient Medications:    acetaminophen (TYLENOL) 500 MG tablet, Take 500 mg by mouth every 6 (six) hours as needed for moderate pain., Disp: , Rfl:    albuterol (VENTOLIN HFA) 108 (90 Base) MCG/ACT inhaler, INHALE 2 PUFFS INTO THE LUNGS EVERY 6 HOURS AS NEEDED FOR WHEEZING OR SHORTNESS OF BREATH, Disp: 18 g, Rfl: 1   buPROPion (WELLBUTRIN SR) 150 MG 12 hr tablet, TAKE ONE TABLET BY MOUTH TWICE A DAY, Disp: 180 tablet, Rfl: 3   cholestyramine (QUESTRAN) 4 g packet, Take 0.5 packets (2 g total) by mouth 2 (two) times daily as needed., Disp: 60 each, Rfl: 11   EPINEPHrine (EPIPEN 2-PAK) 0.3 mg/0.3 mL IJ SOAJ injection, Inject 0.3 mg into the muscle as needed for anaphylaxis. Use as instructed., Disp: 1 each, Rfl: 1   Lactobacillus Rhamnosus, GG, (CULTURELLE) CAPS, Take 1 capsule by mouth daily., Disp: , Rfl:    losartan (COZAAR) 50 MG tablet, Take 0.5 tablets (25 mg total) by mouth in the morning and at bedtime., Disp: 90 tablet, Rfl: 3   methylPREDNISolone (MEDROL DOSEPAK) 4 MG TBPK tablet, Take 6 tablets on day 1.  Take 5 tablets on day 2.  Take 4 tablets on day 3.  Take 3 tablets on day 4.  Take 2 tablets on day 5.  Take 1 tablet on day 6., Disp: 21 tablet, Rfl: 0   metoprolol succinate (TOPROL-XL) 50 MG 24 hr tablet, TAKE ONE TABLET BY MOUTH DAILY WITH OR IMMEDIATELY FOLLOWING A MEAL, Disp: 90 tablet, Rfl:  3   mupirocin ointment (BACTROBAN) 2 %, Apply topically 2 (two) times daily., Disp: 15 g, Rfl: 0   pantoprazole (PROTONIX) 40 MG tablet, TAKE 1 TABLET BY MOUTH TWICE A DAY, Disp: 180 tablet, Rfl: 0   Polyvinyl Alcohol-Povidone (REFRESH OP), Place 1 drop into both eyes daily as needed (dry eyes)., Disp: , Rfl:    predniSONE (DELTASONE) 10 MG tablet, Take one tablet daily for the next 5 days., Disp: 21 tablet, Rfl: 0   RESTASIS 0.05 % ophthalmic emulsion, , Disp: , Rfl:    rosuvastatin (CRESTOR) 10 MG tablet, Take 2 tablets (20 mg total) by mouth daily., Disp: 180 tablet, Rfl: 3   sodium chloride  (OCEAN) 0.65 % SOLN nasal spray, Place 1 spray into both nostrils as needed for congestion., Disp: , Rfl:    zolpidem (AMBIEN) 10 MG tablet, Take 1 tablet (10 mg total) by mouth at bedtime as needed for sleep., Disp: 30 tablet, Rfl: 5   Objective:     Vitals:   03/29/23 1243  BP: 122/80  Pulse: 68  SpO2: 97%  Weight: 139 lb (63 kg)  Height: 5\' 5"  (1.651 m)      Body mass index is 23.13 kg/m.    Physical Exam:    General: awake, alert, and oriented no acute distress, nontoxic Skin: no suspicious lesions or rashes Neuro:sensation intact distally with no deficits, normal muscle tone, no atrophy, strength 5/5 in all tested lower ext groups Psych: normal mood and affect, speech clear   Right hip: No deformity, swelling or wasting ROM Flexion 90, ext 30, IR 45, ER 45 TTP greater trochanter, gluteal musculature, IT band NTTP over the hip flexors,  , si joint, lumbar spine Negative log roll with FROM Negative FABER Negative FADIR Negative Piriformis test   Gait normal     Electronically signed by:  Tina Kelley [facility] 12:58 PM 03/29/23

## 2023-03-30 ENCOUNTER — Ambulatory Visit: Payer: Medicare PPO | Admitting: Sports Medicine

## 2023-03-30 DIAGNOSIS — H16223 Keratoconjunctivitis sicca, not specified as Sjogren's, bilateral: Secondary | ICD-10-CM | POA: Diagnosis not present

## 2023-03-30 DIAGNOSIS — H2513 Age-related nuclear cataract, bilateral: Secondary | ICD-10-CM | POA: Diagnosis not present

## 2023-04-02 NOTE — Telephone Encounter (Signed)
See below, pt needs to be scheduled for PT.

## 2023-04-06 ENCOUNTER — Encounter: Payer: Self-pay | Admitting: Internal Medicine

## 2023-04-06 ENCOUNTER — Ambulatory Visit: Payer: Medicare PPO | Admitting: Internal Medicine

## 2023-04-06 VITALS — BP 120/74 | HR 80 | Ht 65.0 in | Wt 141.4 lb

## 2023-04-06 DIAGNOSIS — R09A2 Foreign body sensation, throat: Secondary | ICD-10-CM | POA: Diagnosis not present

## 2023-04-06 DIAGNOSIS — K219 Gastro-esophageal reflux disease without esophagitis: Secondary | ICD-10-CM | POA: Diagnosis not present

## 2023-04-06 NOTE — Progress Notes (Signed)
   Subjective:    Patient ID: Tina Kelley, female    DOB: [birth year], 72 y.o.   MRN: [id number]  HPI Tina Kelley is a 72 year old female with a history of GERD, LPR, history of C. difficile, sinusitis, right thyroidectomy for adenoma, prior diverticulitis who is here for follow-up.  She was last seen by Alcide Evener, NP in November 2023.  At that time she was having LPR symptoms raising the question of uncontrolled reflux despite twice daily pantoprazole.  24-hour pH and impedance plus manometry was scheduled but she "chickened out".  She reports that she has still had sinus drainage versus reflux.  She has throat clearing, sore throat and globus sensation.  She had a negative strep test recently.  She has heartburn about once a week despite the twice daily pantoprazole mostly brought on by wine or chocolate.  She is also been treated with antibiotics after a CT scanning of the sinuses in November showed sinusitis.  She is currently taking prednisone for back pain.  Her husband of 31 years died last 08-Apr-2023 after recurrent lung cancer.   Review of Systems As per HPI, otherwise negative  Current Medications, Allergies, Past Medical History, Past Surgical History, Family History and Social History were reviewed in [employer] record.    Objective:   Physical Exam BP 120/74 (BP Location: Left Arm, Patient Position: Sitting, Cuff Size: Normal)   Pulse 80   Ht 5\' 5"  (1.651 m)   Wt 141 lb 6 oz (64.1 kg)   SpO2 92%   BMI 23.53 kg/m  Gen: awake, alert, NAD HEENT: anicteric  Neuro: nonfocal      Assessment & Plan:  72 year old female with a history of GERD, LPR, history of C. difficile, sinusitis, right thyroidectomy for adenoma, prior diverticulitis who is here for follow-up.  GERD with globus sensation/LPR with throat clearing --she is having persistent symptoms though not severe on pantoprazole 40 mg twice daily.  She does use Gaviscon as needed.  She was  nervous about pH testing and so canceled it.  She is also been treated for recurrent sinusitis.  EGD did not show Barrett's but did show reflux inflammation by biopsy.  I discussed with her that we could continue current treatment and do no additional testing, reschedule 24-hour pH impedance or consider Bravo capsule placement.  For now she prefers to continue current therapy and see if symptoms improve or worsen -- Continue pantoprazole 40 mg twice daily AC -- Follow-up with me around December 2024 -- If worsening return sooner; again we could consider Bravo placement to know if PPI should be increased yet again to 3 times daily or we could consider vonoprazan  2.  History of adenomatous colon polyp --surveillance colonoscopy due January 2028  30 minutes total spent today including patient facing time, coordination of care, reviewing medical history/procedures/pertinent radiology studies, and documentation of the encounter.

## 2023-04-06 NOTE — Patient Instructions (Signed)
Please follow up Dr. Rhea Belton in December or January.   Continue pantoprazole 40 mg daily.   _______________________________________________________  If your blood pressure at your visit was 140/90 or greater, please contact your primary care physician to follow up on this.  _______________________________________________________  If you are age 73 or older, your body mass index should be between 23-30. Your Body mass index is 23.53 kg/m. If this is out of the aforementioned range listed, please consider follow up with your Primary Care Provider.  If you are age 46 or younger, your body mass index should be between 19-25. Your Body mass index is 23.53 kg/m. If this is out of the aformentioned range listed, please consider follow up with your Primary Care Provider.   ________________________________________________________  The [facility] providers would like to encourage you to use [facility] to communicate with providers for non-urgent requests or questions.  Due to long hold times on the telephone, sending your provider a message by [facility] may be a faster and more efficient way to get a response.  Please allow 48 business hours for a response.  Please remember that this is for non-urgent requests.  _______________________________________________________

## 2023-04-12 DIAGNOSIS — Z6824 Body mass index (BMI) 24.0-24.9, adult: Secondary | ICD-10-CM | POA: Diagnosis not present

## 2023-04-12 DIAGNOSIS — Z124 Encounter for screening for malignant neoplasm of cervix: Secondary | ICD-10-CM | POA: Diagnosis not present

## 2023-04-27 ENCOUNTER — Ambulatory Visit: Payer: Medicare PPO | Admitting: Physical Therapy

## 2023-04-28 ENCOUNTER — Other Ambulatory Visit: Payer: Self-pay | Admitting: Internal Medicine

## 2023-05-04 ENCOUNTER — Ambulatory Visit: Payer: Medicare PPO | Admitting: Physical Therapy

## 2023-05-04 DIAGNOSIS — M25552 Pain in left hip: Secondary | ICD-10-CM

## 2023-05-04 DIAGNOSIS — M25551 Pain in right hip: Secondary | ICD-10-CM | POA: Diagnosis not present

## 2023-05-04 NOTE — Therapy (Unsigned)
OUTPATIENT PHYSICAL THERAPY THORACOLUMBAR EVALUATION   Patient Name: Tina Kelley MRN: [id number] DOB:Nov 15, 1950, 72 y.o., female Today's Date: 05/04/2023   END OF SESSION:    Past Medical History:  Diagnosis Date   ACE-inhibitor cough 11/25/2020   Allergy    seasonal   Arthritis    Basal cell carcinoma of skin 02/29/2012   basal cell carcinoma on her left melolabial fold approximately 30 years  ago   Diverticulitis    Essential hypertension 03/29/2019   GERD (gastroesophageal reflux disease) 03/29/2019   Chronic, normal EGD. 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   Primary insomnia 11/16/2017   Seasonal allergic rhinitis due to pollen 03/29/2019   Past Surgical History:  Procedure Laterality Date   CHOLECYSTECTOMY     COLONOSCOPY     x2   NASAL SINUS SURGERY     THYROIDECTOMY Right 12/08/2021   Procedure: THYROID LOBECTOMY;  Surgeon: Serena Colonel, MD;  Location: [facility] OR;  Service: ENT;  Laterality: Right;   UPPER GASTROINTESTINAL ENDOSCOPY     Patient Active Problem List   Diagnosis Date Noted   Spondylosis of cervical 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 myelopathy or radiculopathy 12/02/2022   Globus sensation 10/19/2022   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   ACE-inhibitor cough 11/25/2020   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   Primary localized osteoarthrosis of multiple sites 09/04/2019   Chronic allergic rhinitis 09/04/2019   Essential hypertension 03/29/2019   Mild intermittent asthma without complication 03/29/2019   Dry eye syndrome 03/29/2019   Family history of premature CAD 03/29/2019   GAD (generalized anxiety disorder) 04/13/2018   Mixed hyperlipidemia 04/13/2018   Primary insomnia 11/16/2017   Gastroesophageal reflux disease 10/29/2015   Postmenopausal atrophic vaginitis 03/13/2013   Dyspareunia 03/13/2013   Basal cell  carcinoma of skin 02/29/2012    PCP: Asencion Partridge   REFERRING PROVIDER: [name]  REFERRING DIAG: thoracic pain, neck pain, rhomboid strain,   Rationale for Evaluation and Treatment: Rehabilitation  THERAPY DIAG:  No diagnosis found.  ONSET DATE:   SUBJECTIVE:                                                                                                                                                                                           SUBJECTIVE STATEMENT: Predisone, done about 4 weeks ago, now quite a bit better.  Was seen previously in PT for her back- thoracic. . Back is better, still sore at times,  Likes to walk- has been too hot.  Did not have injection  Pt has had ongoing pain in thoracic/shoulder blade area for a long time. She was seen in PT in previous years, reporting some improvement at that time. She has been caring for her sick husband, increased lifting, etc, husband now has passed away. She did take round of prednisone about 1 month ago, did not have lasting effects. She is unable to take Nsaids, tylenol does not help much.  States Arthritis in neck, some pain, stiffness. Most bothersome pain is in back/shoulder blades, reports some improvement with activity.  She is Also having pain in bil gr trochanters, sore with getting up,  More with standing  She is Walking some for exercise, but limited due to pain.    PERTINENT HISTORY: diverticulitis, thyroid, HTN, OA,    PAIN:   Are you having pain? Yes: NPRS scale: 5/10 Pain location: R hip  Pain description: sore Aggravating factors: getting out of car, getting up from seated position, increased walking.  Relieving factors: none stated   PRECAUTIONS: None  WEIGHT BEARING RESTRICTIONS: No  FALLS:  Has patient fallen in last 6 months? No   PLOF: Independent  PATIENT GOALS:  decreased pain in t-spine, hips,   NEXT MD VISIT:   OBJECTIVE:   DIAGNOSTIC FINDINGS:    PATIENT SURVEYS:    COGNITION: Overall cognitive status: Within functional limits for tasks assessed     SENSATION:  POSTURE:   PALPATION: Tender  R gr troc, and muscle behind.    ROM:  Lumbar: mild limitation for extension and R SB.  Tight HS with lumbar flexion.  Hips: WFL, mild limitation for ER, IR bil  Thoracic: mild limitation for all motions   LOWER EXTREMITY MMT:    MMT Right eval Left eval  Hip flexion 4 4  Hip extension    Hip abduction 4- 4-  Hip adduction    Hip internal rotation    Hip external rotation 4 4  Knee flexion 5 5  Knee extension 5 5  Ankle dorsiflexion    Ankle plantarflexion    Ankle inversion    Ankle eversion     (Blank rows = not tested)  LUMBAR SPECIAL TESTS:   FUNCTIONAL TESTS:    TODAY'S TREATMENT:                                                                                                                              DATE:   05/04/23: Ther ex: see below for HEP  PATIENT EDUCATION:  Education details: PT POC, Exam findings, HEP Person educated: Patient Education method: Explanation, Demonstration, Tactile cues, Verbal cues, and Handouts Education comprehension: verbalized understanding, returned demonstration, verbal cues required, tactile cues required, and needs further education   HOME EXERCISE PROGRAM: Access Code: [email or web address]   Access Code: [email or web address]   new URL: [email or web address] Date: 05/04/2023 Prepared by: Sedalia Muta  Exercises - Sidelying Hip Abduction  - 1 x daily - 1 sets - 10 reps - Clamshell  -  1 x daily - 1-2 sets - 10 reps - Supine Bridge  - 1 x daily - 1-2 sets - 10 reps  ASSESSMENT:  CLINICAL IMPRESSION: 05/04/23:    Patient presents with primary complaint of increased pain in thoracic region. She has stiffness in t-spine, with increased muscle tension in scapular region. She has decreased ability for lift, carry, and IADls with UEs due to pain. She also has mild muscle tension,  stiffness, and pain in neck, consistent with OA. She has bil hip pain in gr trochanters, and will benefit from strengthening for improving stability and ability for walking, stairs, and functional actvitiy. Pt to benefit from skilled PT to improve deficits and pain.   OBJECTIVE IMPAIRMENTS: decreased activity tolerance, decreased balance, decreased knowledge of use of DME, decreased mobility, difficulty walking, decreased ROM, decreased strength, increased muscle spasms, impaired flexibility, impaired UE functional use, improper body mechanics, and pain.   ACTIVITY LIMITATIONS: carrying, lifting, bending, standing, squatting, stairs, reach over head, hygiene/grooming, locomotion level, and caring for others  PARTICIPATION LIMITATIONS: meal prep, cleaning, laundry, shopping, and community activity  PERSONAL FACTORS:  none  are also affecting patient's functional outcome.   REHAB POTENTIAL: Good  CLINICAL DECISION MAKING: Stable/uncomplicated  EVALUATION COMPLEXITY: Low   GOALS: Goals reviewed with patient? Yes  SHORT TERM GOALS: Target date: 01/05/2023   Pt to be independent with initial HEP  Goal status: INITIAL  2.  Pt to demo improved cervical flex and rot ROM to be WFL/pain free.   Goal status: INITIAL     LONG TERM GOALS: Target date: 02/16/2023  Pt to be independent with final HEP  Goal status: INITIAL  2.  Pt to report decreased pain in t-spine to 0-2/10 with activity, IADLS, and lifting.   Goal status: INITIAL  3.  Pt to report decreased pain in bil hips to 0-2/10 with standing, walking and activity for at least 30 min.   Goal status: INITIAL  4.  Pt to demo improved strength of hips to at least 4+/5 to improve stability and pain.    Goal status: INITIAL    PLAN:  PT FREQUENCY: 1-2x/week  PT DURATION: 8 weeks  PLANNED INTERVENTIONS: Therapeutic exercises, Therapeutic activity, Neuromuscular re-education, Patient/Family education, Self Care, Joint  mobilization, Joint manipulation, Stair training, Orthotic/Fit training, DME instructions, Aquatic Therapy, Dry Needling, Electrical stimulation, Cryotherapy, Moist heat, Taping, Ultrasound, Ionotophoresis 4mg /ml Dexamethasone, Manual therapy,  Vasopneumatic device, Traction, Spinal manipulation, Spinal mobilization,Balance training, Gait training,   PLAN FOR NEXT SESSION:    Sedalia Muta, PT, DPT 10:21 AM  05/04/23

## 2023-05-06 ENCOUNTER — Encounter: Payer: Self-pay | Admitting: Physical Therapy

## 2023-05-12 ENCOUNTER — Ambulatory Visit: Payer: Medicare PPO | Admitting: Physical Therapy

## 2023-05-12 ENCOUNTER — Encounter: Payer: Self-pay | Admitting: Physical Therapy

## 2023-05-12 DIAGNOSIS — M25551 Pain in right hip: Secondary | ICD-10-CM | POA: Diagnosis not present

## 2023-05-12 DIAGNOSIS — M25552 Pain in left hip: Secondary | ICD-10-CM | POA: Diagnosis not present

## 2023-05-12 NOTE — Therapy (Signed)
OUTPATIENT PHYSICAL THERAPY THORACOLUMBAR TREATMENT   Patient Name: Tina Kelley MRN: [id number] DOB:10-Oct-1950, 72 y.o., female Today's Date: 05/04/2023   END OF SESSION:  PT End of Session - 05/12/23 1056     Visit Number 2    Number of Visits 16    Date for PT Re-Evaluation 06/29/23    Authorization Type [employer]    PT Start Time 1015    PT Stop Time 1055    PT Time Calculation (min) 40 min    Activity Tolerance Patient tolerated treatment well    Behavior During Therapy [facility] for tasks assessed/performed               Past Medical History:  Diagnosis Date   ACE-inhibitor cough 11/25/2020   Allergy    seasonal   Arthritis    Basal cell carcinoma of skin 02/29/2012   basal cell carcinoma on her left melolabial fold approximately 30 years  ago   Diverticulitis    Essential hypertension 03/29/2019   GERD (gastroesophageal reflux disease) 03/29/2019   Chronic, normal EGD. 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   Primary insomnia 11/16/2017   Seasonal allergic rhinitis due to pollen 03/29/2019   Past Surgical History:  Procedure Laterality Date   CHOLECYSTECTOMY     COLONOSCOPY     x2   NASAL SINUS SURGERY     THYROIDECTOMY Right 12/08/2021   Procedure: THYROID LOBECTOMY;  Surgeon: Serena Colonel, MD;  Location: [facility] OR;  Service: ENT;  Laterality: Right;   UPPER GASTROINTESTINAL ENDOSCOPY     Patient Active Problem List   Diagnosis Date Noted   Spondylosis of cervical 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 myelopathy or radiculopathy 12/02/2022   Globus sensation 10/19/2022   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   ACE-inhibitor cough 11/25/2020   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   Primary localized osteoarthrosis of multiple sites 09/04/2019   Chronic allergic rhinitis 09/04/2019   Essential hypertension 03/29/2019   Mild intermittent  asthma without complication 03/29/2019   Dry eye syndrome 03/29/2019   Family history of premature CAD 03/29/2019   GAD (generalized anxiety disorder) 04/13/2018   Mixed hyperlipidemia 04/13/2018   Primary insomnia 11/16/2017   Gastroesophageal reflux disease 10/29/2015   Postmenopausal atrophic vaginitis 03/13/2013   Dyspareunia 03/13/2013   Basal cell carcinoma of skin 02/29/2012    PCP: Asencion Partridge   REFERRING PROVIDER: [name]  REFERRING DIAG: thoracic pain, neck pain, rhomboid strain,   Rationale for Evaluation and Treatment: Rehabilitation  THERAPY DIAG:  Pain in right hip  Pain in left hip  ONSET DATE:   SUBJECTIVE:  SUBJECTIVE STATEMENT:  05/12/23  I'm feeling better, have been doing stretches before I get up and it helps a lot    EVAL:Pt reports increased pain in R hip a couple months ago. She did have round of Predisone, finished about 4 weeks ago, that did help. Feels pain is now quite a bit better.  She has been seen previously in PT for her thoracic pain which she reports is doing better, but still sore at times. Pt Likes to walk- but has been too hot, so she has not resumed. Increased walking has made pain in the past.   PERTINENT HISTORY: diverticulitis, thyroid, HTN, OA,    PAIN:   Are you having pain? Yes: NPRS scale: 1/10 Pain location: R hip, going up to R shoulder  Pain description: sore Aggravating factors: lifting stuff, scrubbing things  Relieving factors: none stated   PRECAUTIONS: None  WEIGHT BEARING RESTRICTIONS: No  FALLS:  Has patient fallen in last 6 months? No   PLOF: Independent  PATIENT GOALS:  decreased pain in t-spine, hips,   NEXT MD VISIT:   OBJECTIVE:   DIAGNOSTIC FINDINGS:   PATIENT SURVEYS:   COGNITION: Overall  cognitive status: Within functional limits for tasks assessed     SENSATION:  POSTURE:   PALPATION: Tender in R  ( and L ) gr troc, and into glute musculature     ROM:  Lumbar: mild limitation for extension and R SB. Flexion: mild limitation , with tightness in  hamstrings  Hips: WFL, mild limitation for ER, IR bil  Thoracic: mild limitation for all motions   LOWER EXTREMITY MMT:    MMT Right eval Left eval  Hip flexion 4 4  Hip extension    Hip abduction 4- 4  Hip adduction    Hip internal rotation    Hip external rotation 4 4  Knee flexion 5 5  Knee extension 5 5  Ankle dorsiflexion    Ankle plantarflexion    Ankle inversion    Ankle eversion     (Blank rows = not tested)  LUMBAR SPECIAL TESTS:   FUNCTIONAL TESTS:    TODAY'S TREATMENT:                                                                                                                              DATE:   05/12/23  TherEx  Bridges + ABD into red TB x10  Sidelying hip ABD red TB x10 B Walking bridges x10 Figure 4 stretch 2x30 seconds B Hip flexor stretches 2x30 seconds B  Hip hikes x10 B Hip hikes + ABD x10 B 3 way hip red TB x10 B HS stretches 2x30 seconds B Lumbar rotation stretch 6x3 seconds B      05/04/23: Ther ex: see below for HEP  PATIENT EDUCATION:  Education details: PT POC, Exam findings, HEP Person educated: Patient Education method: Explanation, Demonstration, Tactile cues, Verbal cues, and Handouts Education comprehension: verbalized understanding, returned  demonstration, verbal cues required, tactile cues required, and needs further education   HOME EXERCISE PROGRAM: Access Code: [email or web address] previous   Access Code: [email or web address] new last update 05/12/23 URL: [email or web address] Date: 05/12/2023 Prepared by: Nedra Hai  Exercises - Sidelying Hip Abduction  - 1 x daily - 1 sets - 10 reps - Clamshell  - 1 x daily - 1-2 sets - 10 reps - Supine Bridge  - 1  x daily - 1-2 sets - 10 reps - Standing Hip Extension with Unilateral Counter Support  - 1 x daily - 2 sets - 5 reps - Standing Hip Hiking  - 1 x daily - 7 x weekly - 1 sets - 10 reps - 1 second  hold  ASSESSMENT:  CLINICAL IMPRESSION:   05/12/23   [name] arrives today doing well, sounds like exercises are already starting to help, and she has been careful to do some stretching before getting up in the morning, which is also improving pain. Worked on general hip strengthening and LE flexibility today with good tolerance noted. Incorporated some lumbar stretches due to pain/soreness in this region today as well. Will continue to progress as able/tolerated.   EVAL: Pt presents with primary complaint of increased pain in R hip. She also has soreness in L. She has had ongoing/variable pain in hips for quite some time now. Pain has decreased recently after prednisone. Pt will benefit from education on management, and best strengthening for hips, to improve overall outcome and function. Pt to benefit from skilled PT to improve deficits and pain and to return to PLOF.  Patient presents with primary complaint of increased pain in thoracic region. She has stiffness in t-spine, with increased muscle tension in scapular region. She has decreased ability for lift, carry, and IADls with UEs due to pain. She also has mild muscle tension, stiffness, and pain in neck, consistent with OA. She has bil hip pain in gr trochanters, and will benefit from strengthening for improving stability and ability for walking, stairs, and functional actvitiy. Pt to benefit from skilled PT to improve deficits and pain.   OBJECTIVE IMPAIRMENTS: decreased activity tolerance, decreased balance, decreased knowledge of use of DME, decreased mobility, difficulty walking, decreased ROM, decreased strength, increased muscle spasms, impaired flexibility, impaired UE functional use, improper body mechanics, and pain.   ACTIVITY LIMITATIONS:  lifting, bending, standing, squatting, stairs, hygiene/grooming, locomotion level, and caring for others  PARTICIPATION LIMITATIONS: meal prep, cleaning, laundry, shopping, and community activity  PERSONAL FACTORS: Time since onset of injury/illness/exacerbation are also affecting patient's functional outcome.   REHAB POTENTIAL: Good  CLINICAL DECISION MAKING: Stable/uncomplicated  EVALUATION COMPLEXITY: Low   GOALS: Goals reviewed with patient? Yes  SHORT TERM GOALS: Target date: 05/18/23  Pt to be independent with initial HEP  Goal status: INITIAL    LONG TERM GOALS: Target date: 06/29/23  Pt to be independent with final HEP  Goal status: INITIAL  2.  Pt to report decreased pain in bil hips to 0-2/10 with standing, walking and activity for at least 30 min.   Goal status: INITIAL  3.  Pt to demo improved strength of hips to at least 4+/5 to improve stability and pain.    Goal status: INITIAL   PLAN:  PT FREQUENCY: 1-2x/week  PT DURATION: 8 weeks  PLANNED INTERVENTIONS: Therapeutic exercises, Therapeutic activity, Neuromuscular re-education, Patient/Family education, Self Care, Joint mobilization, Joint manipulation, Stair training, Orthotic/Fit training, DME instructions, Aquatic Therapy, Dry Needling, Electrical stimulation, Cryotherapy, Moist heat, Taping, Ultrasound, Ionotophoresis  4mg /ml Dexamethasone, Manual therapy,  Vasopneumatic device, Traction, Spinal manipulation, Spinal mobilization,Balance training, Gait training,   PLAN FOR NEXT SESSION: continue hip/proximal strengthening and flexibility work,     Nedra Hai, PT, DPT 05/12/23 10:57 AM

## 2023-05-13 ENCOUNTER — Encounter (INDEPENDENT_AMBULATORY_CARE_PROVIDER_SITE_OTHER): Payer: Self-pay

## 2023-05-19 ENCOUNTER — Encounter: Payer: Medicare PPO | Admitting: Physical Therapy

## 2023-05-26 ENCOUNTER — Ambulatory Visit: Payer: Medicare PPO | Admitting: Physical Therapy

## 2023-05-26 ENCOUNTER — Encounter: Payer: Self-pay | Admitting: Physical Therapy

## 2023-05-26 DIAGNOSIS — M25551 Pain in right hip: Secondary | ICD-10-CM

## 2023-05-26 DIAGNOSIS — M25552 Pain in left hip: Secondary | ICD-10-CM | POA: Diagnosis not present

## 2023-05-26 NOTE — Therapy (Signed)
OUTPATIENT PHYSICAL THERAPY THORACOLUMBAR TREATMENT   Patient Name: Tina Kelley MRN: [id number] DOB:1951-06-13, 72 y.o., female Today's Date: 05/26/2023   END OF SESSION:  PT End of Session - 05/26/23 1249     Visit Number 3    Number of Visits 16    Date for PT Re-Evaluation 06/29/23    Authorization Type [employer]    PT Start Time 1104    PT Stop Time 1144    PT Time Calculation (min) 40 min    Activity Tolerance Patient tolerated treatment well    Behavior During Therapy [facility] for tasks assessed/performed                Past Medical History:  Diagnosis Date   ACE-inhibitor cough 11/25/2020   Allergy    seasonal   Arthritis    Basal cell carcinoma of skin 02/29/2012   basal cell carcinoma on her left melolabial fold approximately 30 years  ago   Diverticulitis    Essential hypertension 03/29/2019   GERD (gastroesophageal reflux disease) 03/29/2019   Chronic, normal EGD. 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   Primary insomnia 11/16/2017   Seasonal allergic rhinitis due to pollen 03/29/2019   Past Surgical History:  Procedure Laterality Date   CHOLECYSTECTOMY     COLONOSCOPY     x2   NASAL SINUS SURGERY     THYROIDECTOMY Right 12/08/2021   Procedure: THYROID LOBECTOMY;  Surgeon: Serena Colonel, MD;  Location: [facility] OR;  Service: ENT;  Laterality: Right;   UPPER GASTROINTESTINAL ENDOSCOPY     Patient Active Problem List   Diagnosis Date Noted   Spondylosis of cervical 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 myelopathy or radiculopathy 12/02/2022   Globus sensation 10/19/2022   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   ACE-inhibitor cough 11/25/2020   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   Primary localized osteoarthrosis of multiple sites 09/04/2019   Chronic allergic rhinitis 09/04/2019   Essential hypertension 03/29/2019   Mild intermittent  asthma without complication 03/29/2019   Dry eye syndrome 03/29/2019   Family history of premature CAD 03/29/2019   GAD (generalized anxiety disorder) 04/13/2018   Mixed hyperlipidemia 04/13/2018   Primary insomnia 11/16/2017   Gastroesophageal reflux disease 10/29/2015   Postmenopausal atrophic vaginitis 03/13/2013   Dyspareunia 03/13/2013   Basal cell carcinoma of skin 02/29/2012    PCP: Asencion Partridge   REFERRING PROVIDER: [name]  REFERRING DIAG: thoracic pain, neck pain, rhomboid strain,   Rationale for Evaluation and Treatment: Rehabilitation  THERAPY DIAG:  Pain in right hip  Pain in left hip  ONSET DATE:   SUBJECTIVE:  SUBJECTIVE STATEMENT:  05/26/23 Pt states she thinks hip is getting better. She has been doing HEP   EVAL:Pt reports increased pain in R hip a couple months ago. She did have round of Predisone, finished about 4 weeks ago, that did help. Feels pain is now quite a bit better.  She has been seen previously in PT for her thoracic pain which she reports is doing better, but still sore at times. Pt Likes to walk- but has been too hot, so she has not resumed. Increased walking has made pain in the past.   PERTINENT HISTORY: diverticulitis, thyroid, HTN, OA,    PAIN:   Are you having pain? Yes: NPRS scale: 1/10 Pain location: R hip, going up to R shoulder  Pain description: sore Aggravating factors: lifting stuff, scrubbing things  Relieving factors: none stated   PRECAUTIONS: None  WEIGHT BEARING RESTRICTIONS: No  FALLS:  Has patient fallen in last 6 months? No   PLOF: Independent  PATIENT GOALS:  decreased pain in t-spine, hips,   NEXT MD VISIT:   OBJECTIVE:   DIAGNOSTIC FINDINGS:   PATIENT SURVEYS:   COGNITION: Overall cognitive status: Within  functional limits for tasks assessed     SENSATION:  POSTURE:   PALPATION: Tender in R  ( and L ) gr troc, and into glute musculature     ROM:  Lumbar: mild limitation for extension and R SB. Flexion: mild limitation , with tightness in  hamstrings  Hips: WFL, mild limitation for ER, IR bil  Thoracic: mild limitation for all motions   LOWER EXTREMITY MMT:    MMT Right eval Left eval  Hip flexion 4 4  Hip extension    Hip abduction 4- 4  Hip adduction    Hip internal rotation    Hip external rotation 4 4  Knee flexion 5 5  Knee extension 5 5  Ankle dorsiflexion    Ankle plantarflexion    Ankle inversion    Ankle eversion     (Blank rows = not tested)  LUMBAR SPECIAL TESTS:   FUNCTIONAL TESTS:    TODAY'S TREATMENT:                                                                                                                              DATE:   05/26/2023 Therapeutic Exercise: Aerobic: Supine:  Bridging 2 x 10;   SLR with TA x 10 bil;  S/L hip abd 2 x 10 bil;  Seated: Standing:  hip ext 3 x 5 bil; hip  abd 2 x 10 bil;  Slow march for hip stability x 15;  Rows x 10- difficulty keeping neutral spine; scap squeeze x 10 with practice keeping neutral spine and TA;  Stretches:  LTR x 10;   Piriformis 30 sec x 2 bil; Cat Cow x 10;  Neuromuscular Re-education: Manual Therapy: Therapeutic Activity: Self Care:    05/12/23  TherEx  Bridges + ABD into red  TB x10  Sidelying hip ABD red TB x10 B Walking bridges x10 Figure 4 stretch 2x30 seconds B Hip flexor stretches 2x30 seconds B  Hip hikes x10 B  Hip hikes + ABD x10 B 3 way hip red TB x10 B HS stretches 2x30 seconds B Lumbar rotation stretch 6x3 seconds B       PATIENT EDUCATION:  Education details: PT POC, Exam findings, HEP Person educated: Patient Education method: Explanation, Demonstration, Tactile cues, Verbal cues, and Handouts Education comprehension: verbalized understanding, returned  demonstration, verbal cues required, tactile cues required, and needs further education   HOME EXERCISE PROGRAM: Access Code: [email or web address] previous   Access Code: [email or web address] new last update 05/12/23 URL: [email or web address] Date: 05/12/2023 Prepared by: Nedra Hai  Exercises - Sidelying Hip Abduction  - 1 x daily - 1 sets - 10 reps - Clamshell  - 1 x daily - 1-2 sets - 10 reps - Supine Bridge  - 1 x daily - 1-2 sets - 10 reps - Standing Hip Extension with Unilateral Counter Support  - 1 x daily - 2 sets - 5 reps - Standing Hip Hiking  - 1 x daily - 7 x weekly - 1 sets - 10 reps - 1 second  hold  ASSESSMENT:  CLINICAL IMPRESSION:   05/26/23  Pt challenged with hip and core strength with ther ex today. She has tendency for anterior pelvic tilt in standing and requires max cuing to try to decrease this. Pt will benefit from progressive strengthening. Hip pain somewhat improved.   EVAL: Pt presents with primary complaint of increased pain in R hip. She also has soreness in L. She has had ongoing/variable pain in hips for quite some time now. Pain has decreased recently after prednisone. Pt will benefit from education on management, and best strengthening for hips, to improve overall outcome and function. Pt to benefit from skilled PT to improve deficits and pain and to return to PLOF.    OBJECTIVE IMPAIRMENTS: decreased activity tolerance, decreased balance, decreased knowledge of use of DME, decreased mobility, difficulty walking, decreased ROM, decreased strength, increased muscle spasms, impaired flexibility, impaired UE functional use, improper body mechanics, and pain.   ACTIVITY LIMITATIONS: lifting, bending, standing, squatting, stairs, hygiene/grooming, locomotion level, and caring for others  PARTICIPATION LIMITATIONS: meal prep, cleaning, laundry, shopping, and community activity  PERSONAL FACTORS: Time since onset of injury/illness/exacerbation are also affecting  patient's functional outcome.   REHAB POTENTIAL: Good  CLINICAL DECISION MAKING: Stable/uncomplicated  EVALUATION COMPLEXITY: Low   GOALS: Goals reviewed with patient? Yes  SHORT TERM GOALS: Target date: 05/18/23  Pt to be independent with initial HEP  Goal status: INITIAL    LONG TERM GOALS: Target date: 06/29/23  Pt to be independent with final HEP  Goal status: INITIAL  2.  Pt to report decreased pain in bil hips to 0-2/10 with standing, walking and activity for at least 30 min.   Goal status: INITIAL  3.  Pt to demo improved strength of hips to at least 4+/5 to improve stability and pain.    Goal status: INITIAL   PLAN:  PT FREQUENCY: 1-2x/week  PT DURATION: 8 weeks  PLANNED INTERVENTIONS: Therapeutic exercises, Therapeutic activity, Neuromuscular re-education, Patient/Family education, Self Care, Joint mobilization, Joint manipulation, Stair training, Orthotic/Fit training, DME instructions, Aquatic Therapy, Dry Needling, Electrical stimulation, Cryotherapy, Moist heat, Taping, Ultrasound, Ionotophoresis 4mg /ml Dexamethasone, Manual therapy,  Vasopneumatic device, Traction, Spinal manipulation, Spinal mobilization,Balance training, Gait training,   PLAN FOR NEXT SESSION: continue hip/proximal strengthening  and flexibility work,     Sedalia Muta, PT, DPT 12:50 PM  05/26/23

## 2023-05-27 DIAGNOSIS — Z1231 Encounter for screening mammogram for malignant neoplasm of breast: Secondary | ICD-10-CM | POA: Diagnosis not present

## 2023-05-27 LAB — HM MAMMOGRAPHY

## 2023-06-01 ENCOUNTER — Encounter: Payer: Self-pay | Admitting: Family Medicine

## 2023-06-03 ENCOUNTER — Ambulatory Visit: Payer: Medicare PPO | Admitting: Physical Therapy

## 2023-06-03 ENCOUNTER — Encounter: Payer: Self-pay | Admitting: Physical Therapy

## 2023-06-03 DIAGNOSIS — M25551 Pain in right hip: Secondary | ICD-10-CM

## 2023-06-03 DIAGNOSIS — M25552 Pain in left hip: Secondary | ICD-10-CM | POA: Diagnosis not present

## 2023-06-03 NOTE — Therapy (Signed)
OUTPATIENT PHYSICAL THERAPY THORACOLUMBAR TREATMENT   Patient Name: Tina Kelley MRN: [id number] DOB:Feb 22, 1951, 72 y.o., female Today's Date: 9/5 /2024   END OF SESSION:  PT End of Session - 06/03/23 1258     Visit Number 4    Number of Visits 16    Date for PT Re-Evaluation 06/29/23    Authorization Type [employer]    PT Start Time 1300    PT Stop Time 1344    PT Time Calculation (min) 44 min    Activity Tolerance Patient tolerated treatment well    Behavior During Therapy [facility] for tasks assessed/performed                Past Medical History:  Diagnosis Date   ACE-inhibitor cough 11/25/2020   Allergy    seasonal   Arthritis    Basal cell carcinoma of skin 02/29/2012   basal cell carcinoma on her left melolabial fold approximately 30 years  ago   Diverticulitis    Essential hypertension 03/29/2019   GERD (gastroesophageal reflux disease) 03/29/2019   Chronic, normal EGD. 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   Primary insomnia 11/16/2017   Seasonal allergic rhinitis due to pollen 03/29/2019   Past Surgical History:  Procedure Laterality Date   CHOLECYSTECTOMY     COLONOSCOPY     x2   NASAL SINUS SURGERY     THYROIDECTOMY Right 12/08/2021   Procedure: THYROID LOBECTOMY;  Surgeon: Serena Colonel, MD;  Location: [facility] OR;  Service: ENT;  Laterality: Right;   UPPER GASTROINTESTINAL ENDOSCOPY     Patient Active Problem List   Diagnosis Date Noted   Spondylosis of cervical 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 myelopathy or radiculopathy 12/02/2022   Globus sensation 10/19/2022   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   ACE-inhibitor cough 11/25/2020   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   Primary localized osteoarthrosis of multiple sites 09/04/2019   Chronic allergic rhinitis 09/04/2019   Essential hypertension 03/29/2019   Mild intermittent  asthma without complication 03/29/2019   Dry eye syndrome 03/29/2019   Family history of premature CAD 03/29/2019   GAD (generalized anxiety disorder) 04/13/2018   Mixed hyperlipidemia 04/13/2018   Primary insomnia 11/16/2017   Gastroesophageal reflux disease 10/29/2015   Postmenopausal atrophic vaginitis 03/13/2013   Dyspareunia 03/13/2013   Basal cell carcinoma of skin 02/29/2012    PCP: Asencion Partridge   REFERRING PROVIDER: [name]  REFERRING DIAG: thoracic pain, neck pain, rhomboid strain,   Rationale for Evaluation and Treatment: Rehabilitation  THERAPY DIAG:  Pain in right hip  Pain in left hip  ONSET DATE:   SUBJECTIVE:  SUBJECTIVE STATEMENT:  06/03/23 Pt states hip and back improving. Still has soreness with longer duration standing.    EVAL:Pt reports increased pain in R hip a couple months ago. She did have round of Predisone, finished about 4 weeks ago, that did help. Feels pain is now quite a bit better.  She has been seen previously in PT for her thoracic pain which she reports is doing better, but still sore at times. Pt Likes to walk- but has been too hot, so she has not resumed. Increased walking has made pain in the past.   PERTINENT HISTORY: diverticulitis, thyroid, HTN, OA,    PAIN:   Are you having pain? Yes: NPRS scale: 1/10 Pain location: R hip, going up to R shoulder  Pain description: sore Aggravating factors: lifting stuff, scrubbing things  Relieving factors: none stated   PRECAUTIONS: None  WEIGHT BEARING RESTRICTIONS: No  FALLS:  Has patient fallen in last 6 months? No   PLOF: Independent  PATIENT GOALS:  decreased pain in t-spine, hips,   NEXT MD VISIT:   OBJECTIVE:   DIAGNOSTIC FINDINGS:   PATIENT SURVEYS:   COGNITION: Overall  cognitive status: Within functional limits for tasks assessed     SENSATION:  POSTURE:   PALPATION: Tender in R  ( and L ) gr troc, and into glute musculature     ROM:  Lumbar: mild limitation for extension and R SB. Flexion: mild limitation , with tightness in  hamstrings  Hips: WFL, mild limitation for ER, IR bil  Thoracic: mild limitation for all motions   LOWER EXTREMITY MMT:    MMT Right eval Left eval  Hip flexion 4 4  Hip extension    Hip abduction 4- 4  Hip adduction    Hip internal rotation    Hip external rotation 4 4  Knee flexion 5 5  Knee extension 5 5  Ankle dorsiflexion    Ankle plantarflexion    Ankle inversion    Ankle eversion     (Blank rows = not tested)  LUMBAR SPECIAL TESTS:   FUNCTIONAL TESTS:    TODAY'S TREATMENT:                                                                                                                              DATE:   06/03/2023 Therapeutic Exercise: Aerobic: Supine:  Bridging 2 x 10;   SLR with TA x 10 bil;  S/L hip abd 2 x 10 bil; (cuing for height)  Seated:  hip hinge x 20;   Standing:  hip ext 2 x 10 bil;  hip  abd 2 x 10 bil;  Slow march for hip stability x 15;   Squat to mat table 2 x 10 with cueing for back posture.  Stretches:  LTR x 10;    Neuromuscular Re-education: Manual Therapy: Therapeutic Activity: Self Care:    05/12/23  TherEx  Bridges + ABD into red TB x10  Sidelying hip ABD red TB x10 B Walking bridges x10 Figure 4 stretch 2x30 seconds B Hip flexor stretches 2x30 seconds B  Hip hikes x10 B  Hip hikes + ABD x10 B 3 way hip red TB x10 B HS stretches 2x30 seconds B Lumbar rotation stretch 6x3 seconds B       PATIENT EDUCATION:  Education details: PT POC, Exam findings, HEP Person educated: Patient Education method: Explanation, Demonstration, Tactile cues, Verbal cues, and Handouts Education comprehension: verbalized understanding, returned demonstration, verbal cues  required, tactile cues required, and needs further education   HOME EXERCISE PROGRAM: Access Code: [email or web address] previous   Access Code: [email or web address] new last update 05/12/23 URL: [email or web address] Date: 05/12/2023 Prepared by: Nedra Hai  Exercises - Sidelying Hip Abduction  - 1 x daily - 1 sets - 10 reps - Clamshell  - 1 x daily - 1-2 sets - 10 reps - Supine Bridge  - 1 x daily - 1-2 sets - 10 reps - Standing Hip Extension with Unilateral Counter Support  - 1 x daily - 2 sets - 5 reps - Standing Hip Hiking  - 1 x daily - 7 x weekly - 1 sets - 10 reps - 1 second  hold  ASSESSMENT:  CLINICAL IMPRESSION:   06/03/23  Pt challenged with hip and core strength with ther ex today. She has tendency for anterior pelvic tilt in standing and requires max cuing to try to decrease this. Improved hip hinge motion in sitting after education and practice. She has more difficulty in standing with squat motion, and will benefit from continued practice with this. She continues to have collapse with SL stance phase of marching and SL hip strength, due to weakness, will benefit from continued focus on strength for hips and core.   EVAL: Pt presents with primary complaint of increased pain in R hip. She also has soreness in L. She has had ongoing/variable pain in hips for quite some time now. Pain has decreased recently after prednisone. Pt will benefit from education on management, and best strengthening for hips, to improve overall outcome and function. Pt to benefit from skilled PT to improve deficits and pain and to return to PLOF.    OBJECTIVE IMPAIRMENTS: decreased activity tolerance, decreased balance, decreased knowledge of use of DME, decreased mobility, difficulty walking, decreased ROM, decreased strength, increased muscle spasms, impaired flexibility, impaired UE functional use, improper body mechanics, and pain.   ACTIVITY LIMITATIONS: lifting, bending, standing, squatting, stairs,  hygiene/grooming, locomotion level, and caring for others  PARTICIPATION LIMITATIONS: meal prep, cleaning, laundry, shopping, and community activity  PERSONAL FACTORS: Time since onset of injury/illness/exacerbation are also affecting patient's functional outcome.   REHAB POTENTIAL: Good  CLINICAL DECISION MAKING: Stable/uncomplicated  EVALUATION COMPLEXITY: Low   GOALS: Goals reviewed with patient? Yes  SHORT TERM GOALS: Target date: 05/18/23  Pt to be independent with initial HEP  Goal status: INITIAL    LONG TERM GOALS: Target date: 06/29/23  Pt to be independent with final HEP  Goal status: INITIAL  2.  Pt to report decreased pain in bil hips to 0-2/10 with standing, walking and activity for at least 30 min.   Goal status: INITIAL  3.  Pt to demo improved strength of hips to at least 4+/5 to improve stability and pain.    Goal status: INITIAL   PLAN:  PT FREQUENCY: 1-2x/week  PT DURATION: 8 weeks  PLANNED INTERVENTIONS: Therapeutic exercises, Therapeutic activity, Neuromuscular re-education, Patient/Family education, Self Care, Joint mobilization, Joint  manipulation, Stair training, Orthotic/Fit training, DME instructions, Aquatic Therapy, Dry Needling, Electrical stimulation, Cryotherapy, Moist heat, Taping, Ultrasound, Ionotophoresis 4mg /ml Dexamethasone, Manual therapy,  Vasopneumatic device, Traction, Spinal manipulation, Spinal mobilization,Balance training, Gait training,   PLAN FOR NEXT SESSION: continue hip/proximal strengthening and flexibility work,     Sedalia Muta, PT, DPT 2:59 PM  06/03/23

## 2023-06-07 ENCOUNTER — Ambulatory Visit: Payer: Medicare PPO | Admitting: Family Medicine

## 2023-06-09 ENCOUNTER — Encounter: Payer: Self-pay | Admitting: Internal Medicine

## 2023-06-09 ENCOUNTER — Ambulatory Visit: Payer: Medicare PPO | Admitting: Physical Therapy

## 2023-06-09 ENCOUNTER — Ambulatory Visit: Payer: Medicare PPO | Admitting: Family Medicine

## 2023-06-09 ENCOUNTER — Encounter: Payer: Self-pay | Admitting: Physical Therapy

## 2023-06-09 ENCOUNTER — Encounter: Payer: Self-pay | Admitting: Family Medicine

## 2023-06-09 VITALS — BP 116/78 | HR 67 | Temp 97.7°F | Ht 65.0 in | Wt 141.6 lb

## 2023-06-09 DIAGNOSIS — M25552 Pain in left hip: Secondary | ICD-10-CM | POA: Diagnosis not present

## 2023-06-09 DIAGNOSIS — J309 Allergic rhinitis, unspecified: Secondary | ICD-10-CM | POA: Diagnosis not present

## 2023-06-09 DIAGNOSIS — M7061 Trochanteric bursitis, right hip: Secondary | ICD-10-CM

## 2023-06-09 DIAGNOSIS — K2100 Gastro-esophageal reflux disease with esophagitis, without bleeding: Secondary | ICD-10-CM | POA: Diagnosis not present

## 2023-06-09 DIAGNOSIS — M25551 Pain in right hip: Secondary | ICD-10-CM | POA: Diagnosis not present

## 2023-06-09 DIAGNOSIS — I10 Essential (primary) hypertension: Secondary | ICD-10-CM

## 2023-06-09 DIAGNOSIS — E782 Mixed hyperlipidemia: Secondary | ICD-10-CM | POA: Diagnosis not present

## 2023-06-09 DIAGNOSIS — M7062 Trochanteric bursitis, left hip: Secondary | ICD-10-CM

## 2023-06-09 DIAGNOSIS — R7301 Impaired fasting glucose: Secondary | ICD-10-CM

## 2023-06-09 DIAGNOSIS — Z23 Encounter for immunization: Secondary | ICD-10-CM

## 2023-06-09 LAB — COMPREHENSIVE METABOLIC PANEL
ALT: 38 U/L — ABNORMAL HIGH (ref 0–35)
AST: 45 U/L — ABNORMAL HIGH (ref 0–37)
Albumin: 4 g/dL (ref 3.5–5.2)
Alkaline Phosphatase: 79 U/L (ref 39–117)
BUN: 19 mg/dL (ref 6–23)
CO2: 28 meq/L (ref 19–32)
Calcium: 9.2 mg/dL (ref 8.4–10.5)
Chloride: 101 meq/L (ref 96–112)
Creatinine, Ser: 1.14 mg/dL (ref 0.40–1.20)
GFR: 47.71 mL/min — ABNORMAL LOW (ref >60.00)
Glucose, Bld: 85 mg/dL (ref 70–99)
Potassium: 3.8 meq/L (ref 3.5–5.1)
Sodium: 139 meq/L (ref 135–145)
Total Bilirubin: 0.8 mg/dL (ref 0.2–1.2)
Total Protein: 6.7 g/dL (ref 6.0–8.3)

## 2023-06-09 LAB — LIPID PANEL
Cholesterol: 184 mg/dL (ref 0–200)
HDL: 76.3 mg/dL (ref >39.00)
LDL Cholesterol: 82 mg/dL (ref 0–99)
NonHDL: 107.21
Total CHOL/HDL Ratio: 2
Triglycerides: 125 mg/dL (ref 0.0–149.0)
VLDL: 25 mg/dL (ref 0.0–40.0)

## 2023-06-09 LAB — HEMOGLOBIN A1C: Hgb A1c MFr Bld: 5.5 % (ref 4.6–6.5)

## 2023-06-09 NOTE — Therapy (Signed)
OUTPATIENT PHYSICAL THERAPY THORACOLUMBAR TREATMENT   Patient Name: Tina Kelley MRN: [id number] DOB:Jun 17, 1951, 72 y.o., female Today's Date: 9/11 /2024   END OF SESSION:  PT End of Session - 06/09/23 1116     Visit Number 5    Number of Visits 16    Date for PT Re-Evaluation 06/29/23    Authorization Type [employer]    PT Start Time 1110    PT Stop Time 1148    PT Time Calculation (min) 38 min    Activity Tolerance Patient tolerated treatment well    Behavior During Therapy [facility] for tasks assessed/performed                 Past Medical History:  Diagnosis Date   ACE-inhibitor cough 11/25/2020   Allergy    seasonal   Arthritis    Basal cell carcinoma of skin 02/29/2012   basal cell carcinoma on her left melolabial fold approximately 30 years  ago   Diverticulitis    Essential hypertension 03/29/2019   GERD (gastroesophageal reflux disease) 03/29/2019   Chronic, normal EGD. 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   Primary insomnia 11/16/2017   Seasonal allergic rhinitis due to pollen 03/29/2019   Past Surgical History:  Procedure Laterality Date   CHOLECYSTECTOMY     COLONOSCOPY     x2   NASAL SINUS SURGERY     THYROIDECTOMY Right 12/08/2021   Procedure: THYROID LOBECTOMY;  Surgeon: Serena Colonel, MD;  Location: [facility] OR;  Service: ENT;  Laterality: Right;   UPPER GASTROINTESTINAL ENDOSCOPY     Patient Active Problem List   Diagnosis Date Noted   Spondylosis of cervical 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 myelopathy or radiculopathy 12/02/2022   Globus sensation 10/19/2022   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   ACE-inhibitor cough 11/25/2020   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   Primary localized osteoarthrosis of multiple sites 09/04/2019   Chronic allergic rhinitis 09/04/2019   Essential hypertension 03/29/2019   Mild  intermittent asthma without complication 03/29/2019   Dry eye syndrome 03/29/2019   Family history of premature CAD 03/29/2019   GAD (generalized anxiety disorder) 04/13/2018   Mixed hyperlipidemia 04/13/2018   Primary insomnia 11/16/2017   Gastroesophageal reflux disease 10/29/2015   Postmenopausal atrophic vaginitis 03/13/2013   Dyspareunia 03/13/2013   Basal cell carcinoma of skin 02/29/2012    PCP: Asencion Partridge   REFERRING PROVIDER: [name]  REFERRING DIAG: thoracic pain, neck pain, rhomboid strain,   Rationale for Evaluation and Treatment: Rehabilitation  THERAPY DIAG:  Pain in right hip  Pain in left hip  ONSET DATE:   SUBJECTIVE:  SUBJECTIVE STATEMENT:  06/09/23 Pt states hip still sore, but better. Thoracic pain variable, but manageable.    EVAL:Pt reports increased pain in R hip a couple months ago. She did have round of Predisone, finished about 4 weeks ago, that did help. Feels pain is now quite a bit better.  She has been seen previously in PT for her thoracic pain which she reports is doing better, but still sore at times. Pt Likes to walk- but has been too hot, so she has not resumed. Increased walking has made pain in the past.   PERTINENT HISTORY: diverticulitis, thyroid, HTN, OA,    PAIN:   Are you having pain? Yes: NPRS scale: 1/10 Pain location: R hip, going up to R shoulder  Pain description: sore Aggravating factors: lifting stuff, scrubbing things  Relieving factors: none stated   PRECAUTIONS: None  WEIGHT BEARING RESTRICTIONS: No  FALLS:  Has patient fallen in last 6 months? No   PLOF: Independent  PATIENT GOALS:  decreased pain in t-spine, hips,   NEXT MD VISIT:   OBJECTIVE:   DIAGNOSTIC FINDINGS:   PATIENT SURVEYS:   COGNITION: Overall  cognitive status: Within functional limits for tasks assessed     SENSATION:  POSTURE:   PALPATION: Tender in R  ( and L ) gr troc, and into glute musculature     ROM:  Lumbar: mild limitation for extension and R SB. Flexion: mild limitation , with tightness in  hamstrings  Hips: WFL, mild limitation for ER, IR bil  Thoracic: mild limitation for all motions   LOWER EXTREMITY MMT:    MMT Right eval Left eval  Hip flexion 4 4  Hip extension    Hip abduction 4- 4  Hip adduction    Hip internal rotation    Hip external rotation 4 4  Knee flexion 5 5  Knee extension 5 5  Ankle dorsiflexion    Ankle plantarflexion    Ankle inversion    Ankle eversion     (Blank rows = not tested)  LUMBAR SPECIAL TESTS:   FUNCTIONAL TESTS:    TODAY'S TREATMENT:                                                                                                                              DATE:   06/09/2023 Therapeutic Exercise: Aerobic: Supine:   Seated:  hip hinge x 20;   Standing:  hip ext 2 x 10 bil;  hip  abd 2 x 10 bil;    Squat to mat table 2 x 10 with cueing for back posture.  Standing scap squeeze x 10 with cueing for back posture.  Education on standing posture with reaching and UE movements.    Stretches:  cat cow 2 x 10, slow, with max cueing for mechanics.  Neuromuscular Re-education: Manual Therapy: Therapeutic Activity: Self Care:   Therapeutic Exercise: Aerobic: Supine:  Bridging 2 x 10;   SLR with TA x  10 bil;  S/L hip abd 2 x 10 bil; (cuing for height)  Seated:  hip hinge x 20;   Standing:  hip ext 2 x 10 bil;  hip  abd 2 x 10 bil;  Slow march for hip stability x 15;   Squat to mat table 2 x 10 with cueing for back posture.  Stretches:  LTR x 10;    Neuromuscular Re-education: Manual Therapy: Therapeutic Activity: Self Care:    05/12/23  TherEx  Bridges + ABD into red TB x10  Sidelying hip ABD red TB x10 B Walking bridges x10 Figure 4 stretch 2x30  seconds B Hip flexor stretches 2x30 seconds B  Hip hikes x10 B  Hip hikes + ABD x10 B 3 way hip red TB x10 B HS stretches 2x30 seconds B Lumbar rotation stretch 6x3 seconds B       PATIENT EDUCATION:  Education details: PT POC, Exam findings, HEP Person educated: Patient Education method: Explanation, Demonstration, Tactile cues, Verbal cues, and Handouts Education comprehension: verbalized understanding, returned demonstration, verbal cues required, tactile cues required, and needs further education   HOME EXERCISE PROGRAM: Access Code: [email or web address] previous   Access Code: [email or web address] new last update 05/12/23 URL: [email or web address] Date: 05/12/2023 Prepared by: Nedra Hai  Exercises - Sidelying Hip Abduction  - 1 x daily - 1 sets - 10 reps - Clamshell  - 1 x daily - 1-2 sets - 10 reps - Supine Bridge  - 1 x daily - 1-2 sets - 10 reps - Standing Hip Extension with Unilateral Counter Support  - 1 x daily - 2 sets - 5 reps - Standing Hip Hiking  - 1 x daily - 7 x weekly - 1 sets - 10 reps - 1 second  hold  ASSESSMENT:  CLINICAL IMPRESSION:   06/09/23  Pt challenged with hip and core strength with ther ex today. She has tendency for anterior pelvic tilt in standing and requires max cuing to try to decrease this. Challenged with hip hinge motion in sitting and standing. Marland Kitchen She will benefit from continued focus on strength for hips and core.   EVAL: Pt presents with primary complaint of increased pain in R hip. She also has soreness in L. She has had ongoing/variable pain in hips for quite some time now. Pain has decreased recently after prednisone. Pt will benefit from education on management, and best strengthening for hips, to improve overall outcome and function. Pt to benefit from skilled PT to improve deficits and pain and to return to PLOF.    OBJECTIVE IMPAIRMENTS: decreased activity tolerance, decreased balance, decreased knowledge of use of DME, decreased  mobility, difficulty walking, decreased ROM, decreased strength, increased muscle spasms, impaired flexibility, impaired UE functional use, improper body mechanics, and pain.   ACTIVITY LIMITATIONS: lifting, bending, standing, squatting, stairs, hygiene/grooming, locomotion level, and caring for others  PARTICIPATION LIMITATIONS: meal prep, cleaning, laundry, shopping, and community activity  PERSONAL FACTORS: Time since onset of injury/illness/exacerbation are also affecting patient's functional outcome.   REHAB POTENTIAL: Good  CLINICAL DECISION MAKING: Stable/uncomplicated  EVALUATION COMPLEXITY: Low   GOALS: Goals reviewed with patient? Yes  SHORT TERM GOALS: Target date: 05/18/23  Pt to be independent with initial HEP  Goal status: INITIAL    LONG TERM GOALS: Target date: 06/29/23  Pt to be independent with final HEP  Goal status: INITIAL  2.  Pt to report decreased pain in bil hips to 0-2/10 with standing, walking and activity for at least 30 min.  Goal status: INITIAL  3.  Pt to demo improved strength of hips to at least 4+/5 to improve stability and pain.    Goal status: INITIAL   PLAN:  PT FREQUENCY: 1-2x/week  PT DURATION: 8 weeks  PLANNED INTERVENTIONS: Therapeutic exercises, Therapeutic activity, Neuromuscular re-education, Patient/Family education, Self Care, Joint mobilization, Joint manipulation, Stair training, Orthotic/Fit training, DME instructions, Aquatic Therapy, Dry Needling, Electrical stimulation, Cryotherapy, Moist heat, Taping, Ultrasound, Ionotophoresis 4mg /ml Dexamethasone, Manual therapy,  Vasopneumatic device, Traction, Spinal manipulation, Spinal mobilization,Balance training, Gait training,   PLAN FOR NEXT SESSION: continue hip/proximal strengthening and flexibility work,     Sedalia Muta, PT, DPT 11:59 AM  06/09/23

## 2023-06-09 NOTE — Progress Notes (Signed)
Subjective  CC:  Chief Complaint  Patient presents with   Hypertension    HPI: Tina Kelley is a 72 y.o. female who presents to the office today to address the problems listed above in the chief complaint. Hypertension f/u: Control is good . Pt reports she is doing well. taking medications as instructed, no medication side effects noted, no TIAs, no chest pain on exertion, no dyspnea on exertion, no swelling of ankles. She denies adverse effects from his BP medications. Compliance with medication is good.  HLD on crestor 20 now x 6 months but experiencing persistent and worsening myalgias. Has chronic back pain and seeing PT for hip pain. Hip bursitis bilaterally status post steroid injections about 6 months close from ortho.  Mildly improved. Impaired fasting glucose: 107 on labs in February.  Here for follow-up.  No symptoms of hyperglycemia.  Has been eating a healthy diet. Chronic cough with GERD and allergies seeing ENT.  Assessment  1. Essential hypertension   2. Mixed hyperlipidemia   3. IFG (impaired fasting glucose)   4. Encounter for immunization   5. Trochanteric bursitis of both hips   6. Chronic allergic rhinitis   7. Gastroesophageal reflux disease with esophagitis without hemorrhage      Plan   Hypertension f/u: BP control is well controlled.  Continue current medicines Hyperlipidemia but with myalgias on Crestor 20.  Decrease back to 10 nightly.  Recheck fasting lipids. Impaired fasting glucose: Healthy diet.  Monitoring.  Check A1c today.  Fasting glucose Follow-up with ENT: Continue allergy medications Continue PPI for possible GERD.  To have endoscopy if not improving Flu shot updated today  Education regarding management of these chronic disease states was given. Management strategies discussed on successive visits include dietary and exercise recommendations, goals of achieving and maintaining IBW, and lifestyle modifications aiming for adequate sleep and  minimizing stressors.   Follow up: 6 months for complete physical  Orders Placed This Encounter  Procedures   Flu Vaccine Trivalent High Dose (Fluad)   Lipid panel   Comprehensive metabolic panel   Hemoglobin A1c   No orders of the defined types were placed in this encounter.     BP Readings from Last 3 Encounters:  06/09/23 116/78  04/06/23 120/74  03/29/23 122/80   Wt Readings from Last 3 Encounters:  06/09/23 141 lb 9.6 oz (64.2 kg)  04/06/23 141 lb 6 oz (64.1 kg)  03/29/23 139 lb (63 kg)    Lab Results  Component Value Date   CHOL 224 (H) 12/02/2022   CHOL 204 (H) 11/28/2021   CHOL 181 11/25/2020   Lab Results  Component Value Date   HDL 85.20 12/02/2022   HDL 73.30 11/28/2021   HDL 64.90 11/25/2020   Lab Results  Component Value Date   LDLCALC 97 11/28/2021   LDLCALC 92 11/24/2019   LDLCALC 98 11/22/2018   Lab Results  Component Value Date   TRIG 201.0 (H) 12/02/2022   TRIG 169.0 (H) 11/28/2021   TRIG 250.0 (H) 11/25/2020   Lab Results  Component Value Date   CHOLHDL 3 12/02/2022   CHOLHDL 3 11/28/2021   CHOLHDL 3 11/25/2020   Lab Results  Component Value Date   LDLDIRECT 108.0 12/02/2022   LDLDIRECT 82.0 11/25/2020   Lab Results  Component Value Date   CREATININE 1.05 12/02/2022   BUN 17 12/02/2022   NA 140 12/02/2022   K 4.6 12/02/2022   CL 101 12/02/2022   CO2 28 12/02/2022  The 10-year ASCVD risk score (Arnett DK, et al., 2019) is: 14.2%   Values used to calculate the score:     Age: 70 years     Sex: Female     Is Non-Hispanic African American: No     Diabetic: No     Tobacco smoker: No     Systolic Blood Pressure: 116 mmHg     Is BP treated: Yes     HDL Cholesterol: 85.2 mg/dL     Total Cholesterol: 224 mg/dL  I reviewed the patients updated PMH, FH, and SocHx.    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    Priority: Medium    Primary localized osteoarthrosis of multiple sites 09/04/2019    Priority: Medium    Mild intermittent asthma without complication 03/29/2019    Priority: Medium    Gastroesophageal reflux disease 10/29/2015    Priority: Medium    Dyspareunia 03/13/2013    Priority: Medium    Basal cell carcinoma of skin 02/29/2012    Priority: Medium    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    Priority: Low   ACE-inhibitor cough 11/25/2020    Priority: Low   Chronic allergic rhinitis 09/04/2019    Priority: Low   Dry eye syndrome 03/29/2019    Priority: Low   Postmenopausal atrophic vaginitis 03/13/2013    Priority: Low   Spondylosis of cervical 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 myelopathy or radiculopathy 12/02/2022   Globus sensation 10/19/2022   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 She has never used smokeless tobacco. She reports current alcohol use. She reports that she does not use drugs.  Current Meds  Medication Sig   acetaminophen (TYLENOL) 500 MG tablet Take 500 mg by mouth every 6 (six) hours as needed for moderate pain.   albuterol (VENTOLIN HFA) 108 (90 Base) MCG/ACT inhaler INHALE 2 PUFFS INTO THE LUNGS EVERY 6 HOURS AS NEEDED FOR WHEEZING OR SHORTNESS OF BREATH   buPROPion (WELLBUTRIN SR) 150 MG 12 hr tablet TAKE ONE TABLET BY MOUTH TWICE A DAY   cholestyramine (QUESTRAN) 4 g packet Take 0.5 packets (2 g total) by mouth 2 (two) times daily as needed.   EPINEPHrine (EPIPEN 2-PAK) 0.3 mg/0.3 mL IJ SOAJ injection Inject 0.3 mg into the muscle as needed  for anaphylaxis. Use as instructed.   Lactobacillus Rhamnosus, GG, (CULTURELLE) CAPS Take 1 capsule by mouth daily.   losartan (COZAAR) 50 MG tablet Take 0.5 tablets (25 mg total) by mouth in the morning and at bedtime.   metoprolol succinate (TOPROL-XL) 50 MG 24 hr tablet TAKE ONE TABLET BY MOUTH DAILY WITH OR IMMEDIATELY FOLLOWING A MEAL   mupirocin ointment (BACTROBAN) 2 % Apply topically 2 (two) times daily.   pantoprazole (PROTONIX) 40 MG tablet TAKE 1 TABLET BY MOUTH 2 TIMES A DAY   Polyvinyl Alcohol-Povidone (REFRESH OP) Place 1 drop into both eyes daily as needed (dry eyes).   RESTASIS 0.05 % ophthalmic emulsion  rosuvastatin (CRESTOR) 10 MG tablet Take 2 tablets (20 mg total) by mouth daily.   sodium chloride (OCEAN) 0.65 % SOLN nasal spray Place 1 spray into both nostrils as needed for congestion.   zolpidem (AMBIEN) 10 MG tablet Take 1 tablet (10 mg total) by mouth at bedtime as needed for sleep.    Review of Systems: Cardiovascular: negative for chest pain, palpitations, leg swelling, orthopnea Respiratory: negative for SOB, wheezing or persistent cough Gastrointestinal: negative for abdominal pain Genitourinary: negative for dysuria or gross hematuria  Objective  Vitals: BP 116/78   Pulse 67   Temp 97.7 F (36.5 C)   Ht 5\' 5"  (1.651 m)   Wt 141 lb 9.6 oz (64.2 kg)   SpO2 98%   BMI 23.56 kg/m  General: no acute distress  Psych:  Alert and oriented, normal mood and affect HEENT:  Normocephalic, atraumatic, supple neck  Cardiovascular:  RRR without murmur. no edema Respiratory:  Good breath sounds bilaterally, CTAB with normal respiratory effort, no wheezing Skin:  Warm, no rashes Neurologic:   Mental status is normal Commons side effects, risks, benefits, and alternatives for medications and treatment plan prescribed today were discussed, and the patient expressed understanding of the given instructions. Patient is instructed to call or message via MyChart if he/she  has any questions or concerns regarding our treatment plan. No barriers to understanding were identified. We discussed Red Flag symptoms and signs in detail. Patient expressed understanding regarding what to do in case of urgent or emergency type symptoms.  Medication list was reconciled, printed and provided to the patient in AVS. Patient instructions and summary information was reviewed with the patient as documented in the AVS. This note was prepared with assistance of Dragon voice recognition software. Occasional wrong-word or sound-a-like substitutions may have occurred due to the inherent limitation

## 2023-06-10 NOTE — Progress Notes (Signed)
See my chart note.

## 2023-06-16 ENCOUNTER — Ambulatory Visit: Payer: Medicare PPO | Admitting: Physical Therapy

## 2023-06-16 ENCOUNTER — Encounter: Payer: Self-pay | Admitting: Physical Therapy

## 2023-06-16 DIAGNOSIS — M25551 Pain in right hip: Secondary | ICD-10-CM

## 2023-06-16 DIAGNOSIS — M25552 Pain in left hip: Secondary | ICD-10-CM | POA: Diagnosis not present

## 2023-06-16 NOTE — Therapy (Addendum)
 OUTPATIENT PHYSICAL THERAPY THORACOLUMBAR TREATMENT   Patient Name: Heba Arutyunyan MRN: [id number] DOB:03/05/1951, 72 y.o., female Today's Date: 9/18 /2024   END OF SESSION:  PT End of Session - 06/16/23 1215     Visit Number 6    Number of Visits 16    Date for PT Re-Evaluation 06/29/23    Authorization Type [employer]    PT Start Time 1216    PT Stop Time 1300    PT Time Calculation (min) 44 min    Activity Tolerance Patient tolerated treatment well    Behavior During Therapy [facility] for tasks assessed/performed                 Past Medical History:  Diagnosis Date   ACE-inhibitor cough 11/25/2020   Allergy    seasonal   Arthritis    Basal cell carcinoma of skin 02/29/2012   basal cell carcinoma on her left melolabial fold approximately 30 years  ago   Diverticulitis    Essential hypertension 03/29/2019   GERD (gastroesophageal reflux disease) 03/29/2019   Chronic, normal EGD. 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   Primary insomnia 11/16/2017   Seasonal allergic rhinitis due to pollen 03/29/2019   Past Surgical History:  Procedure Laterality Date   CHOLECYSTECTOMY     COLONOSCOPY     x2   NASAL SINUS SURGERY     THYROIDECTOMY Right 12/08/2021   Procedure: THYROID  LOBECTOMY;  Surgeon: Janita Mellow, MD;  Location: [facility] OR;  Service: ENT;  Laterality: Right;   UPPER GASTROINTESTINAL ENDOSCOPY     Patient Active Problem List   Diagnosis Date Noted   Spondylosis of cervical 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 myelopathy or radiculopathy 12/02/2022   Globus sensation 10/19/2022   Follicular adenoma of thyroid  gland 03/30/2022   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   ACE-inhibitor cough 11/25/2020   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   Primary localized osteoarthrosis of multiple sites 09/04/2019   Chronic allergic rhinitis 09/04/2019   Essential hypertension 03/29/2019   Mild  intermittent asthma without complication 03/29/2019   Dry eye syndrome 03/29/2019   Family history of premature CAD 03/29/2019   GAD (generalized anxiety disorder) 04/13/2018   Mixed hyperlipidemia 04/13/2018   Primary insomnia 11/16/2017   Gastroesophageal reflux disease 10/29/2015   Postmenopausal atrophic vaginitis 03/13/2013   Dyspareunia 03/13/2013   Basal cell carcinoma of skin 02/29/2012    PCP: Karma Oz   REFERRING PROVIDER: Marshall Skeeter  REFERRING DIAG: thoracic pain, neck pain, rhomboid strain,   Rationale for Evaluation and Treatment: Rehabilitation  THERAPY DIAG:  Pain in right hip  Pain in left hip  ONSET DATE:   SUBJECTIVE:  SUBJECTIVE STATEMENT:  06/16/23 Still having pain in R hip, when getting up and down. Mid backand low back doing better .Has cut statin in 1/2, and thinks her back pain has improved.    EVAL:Pt reports increased pain in R hip a couple months ago. She did have round of Predisone, finished about 4 weeks ago, that did help. Feels pain is now quite a bit better.  She has been seen previously in PT for her thoracic pain which she reports is doing better, but still sore at times. Pt Likes to walk- but has been too hot, so she has not resumed. Increased walking has made pain in the past.   PERTINENT HISTORY: diverticulitis, thyroid , HTN, OA,    PAIN:   Are you having pain? Yes: NPRS scale: 1/10 Pain location: R hip, going up to R shoulder  Pain description: sore Aggravating factors: lifting stuff, scrubbing things  Relieving factors: none stated   PRECAUTIONS: None  WEIGHT BEARING RESTRICTIONS: No  FALLS:  Has patient fallen in last 6 months? No   PLOF: Independent  PATIENT GOALS:  decreased pain in t-spine, hips,   NEXT MD VISIT:    OBJECTIVE:   DIAGNOSTIC FINDINGS:   PATIENT SURVEYS:   COGNITION: Overall cognitive status: Within functional limits for tasks assessed     SENSATION:  POSTURE:   PALPATION: Tender in R  ( and L ) gr troc, and into glute musculature     ROM:  Lumbar: mild limitation for extension and R SB. Flexion: mild limitation , with tightness in  hamstrings  Hips: WFL, mild limitation for ER, IR bil  Thoracic: mild limitation for all motions   LOWER EXTREMITY MMT:    MMT Right eval Left eval  Hip flexion 4 4  Hip extension    Hip abduction 4- 4  Hip adduction    Hip internal rotation    Hip external rotation 4 4  Knee flexion 5 5  Knee extension 5 5  Ankle dorsiflexion    Ankle plantarflexion    Ankle inversion    Ankle eversion     (Blank rows = not tested)  LUMBAR SPECIAL TESTS:   FUNCTIONAL TESTS:    TODAY'S TREATMENT:                                                                                                                              DATE:   06/16/2023 Therapeutic Exercise: Aerobic: Supine:   Seated:  hip hinge x 15;  sit to stand x 10;  Standing:  hip ext 2 x 10 bil;  hip  abd 2 x 10 bil;   Squats 2 x 10 with cueing for back posture.  Standing scap squeeze x 10 with cueing for back posture.   Squat lift 15 lb KB from 12 in height/box  x 5 with education on body mechanics.  Ambulation 100 ft x 3, cuing for increased arm swing and decreasing thoracic stiffness .  Stretches:  cat cow 2 x 10, slow,  Neuromuscular Re-education: Manual Therapy: IASTM to R gr troch and hip.  Therapeutic Activity: Self Care:   Therapeutic Exercise: Aerobic: Supine:   Seated:  hip hinge x 20;   Standing:  hip ext 2 x 10 bil;  hip  abd 2 x 10 bil;    Squat to mat table 2 x 10 with cueing for back posture.  Standing scap squeeze x 10 with cueing for back posture.  Education on standing posture with reaching and UE movements.    Stretches:  cat cow 2 x 10, slow, with max  cueing for mechanics.  Neuromuscular Re-education: Manual Therapy: Therapeutic Activity: Self Care:   Therapeutic Exercise: Aerobic: Supine:  Bridging 2 x 10;   SLR with TA x 10 bil;  S/L hip abd 2 x 10 bil; (cuing for height)  Seated:  hip hinge x 20;   Standing:  hip ext 2 x 10 bil;  hip  abd 2 x 10 bil;  Slow march for hip stability x 15;   Squat to mat table 2 x 10 with cueing for back posture.  Stretches:  LTR x 10;    Neuromuscular Re-education: Manual Therapy: Therapeutic Activity: Self Care:    05/12/23  TherEx  Bridges + ABD into red TB x10  Sidelying hip ABD red TB x10 B Walking bridges x10 Figure 4 stretch 2x30 seconds B Hip flexor stretches 2x30 seconds B  Hip hikes x10 B  Hip hikes + ABD x10 B 3 way hip red TB x10 B HS stretches 2x30 seconds B Lumbar rotation stretch 6x3 seconds B       PATIENT EDUCATION:  Education details: PT POC, Exam findings, HEP Person educated: Patient Education method: Explanation, Demonstration, Tactile cues, Verbal cues, and Handouts Education comprehension: verbalized understanding, returned demonstration, verbal cues required, tactile cues required, and needs further education   HOME EXERCISE PROGRAM: Access Code: [email or web address]   Access Code: [email or web address]  URL: [email or web address] Date: 05/12/2023 Prepared by: Terrel Ferries  Exercises - Sidelying Hip Abduction  - 1 x daily - 1 sets - 10 reps - Clamshell  - 1 x daily - 1-2 sets - 10 reps - Supine Bridge  - 1 x daily - 1-2 sets - 10 reps - Standing Hip Extension with Unilateral Counter Support  - 1 x daily - 2 sets - 5 reps - Standing Hip Hiking  - 1 x daily - 7 x weekly - 1 sets - 10 reps - 1 second  hold  ASSESSMENT:  CLINICAL IMPRESSION:   06/16/23  Pt with improved movement quality and ability for hip hinge and squat position today from last visit. She is doing better with pelvis position in standing, but still requires cueing at times,  especially with arm movements. She has continued soreness at greater trochanter, manual done today for soreness. She has gait mechanics WFL, but is also still feeling pain in hip with walking with full stride. Recommended she continue walking short distances if not increasing pain.    EVAL: Pt presents with primary complaint of increased pain in R hip. She also has soreness in L. She has had ongoing/variable pain in hips for quite some time now. Pain has decreased recently after prednisone . Pt will benefit from education on management, and best strengthening for hips, to improve overall outcome and function. Pt to benefit from skilled PT to improve deficits and pain and to return to PLOF.    OBJECTIVE IMPAIRMENTS:  decreased activity tolerance, decreased balance, decreased knowledge of use of DME, decreased mobility, difficulty walking, decreased ROM, decreased strength, increased muscle spasms, impaired flexibility, impaired UE functional use, improper body mechanics, and pain.   ACTIVITY LIMITATIONS: lifting, bending, standing, squatting, stairs, hygiene/grooming, locomotion level, and caring for others  PARTICIPATION LIMITATIONS: meal prep, cleaning, laundry, shopping, and community activity  PERSONAL FACTORS: Time since onset of injury/illness/exacerbation are also affecting patient's functional outcome.   REHAB POTENTIAL: Good  CLINICAL DECISION MAKING: Stable/uncomplicated  EVALUATION COMPLEXITY: Low   GOALS: Goals reviewed with patient? Yes  SHORT TERM GOALS: Target date: 05/18/23  Pt to be independent with initial HEP  Goal status: INITIAL    LONG TERM GOALS: Target date: 06/29/23  Pt to be independent with final HEP  Goal status: INITIAL  2.  Pt to report decreased pain in bil hips to 0-2/10 with standing, walking and activity for at least 30 min.   Goal status: INITIAL  3.  Pt to demo improved strength of hips to at least 4+/5 to improve stability and pain.    Goal  status: INITIAL   PLAN:  PT FREQUENCY: 1-2x/week  PT DURATION: 8 weeks  PLANNED INTERVENTIONS: Therapeutic exercises, Therapeutic activity, Neuromuscular re-education, Patient/Family education, Self Care, Joint mobilization, Joint manipulation, Stair training, Orthotic/Fit training, DME instructions, Aquatic Therapy, Dry Needling, Electrical stimulation, Cryotherapy, Moist heat, Taping, Ultrasound, Ionotophoresis 4mg /ml Dexamethasone , Manual therapy,  Vasopneumatic device, Traction, Spinal manipulation, Spinal mobilization,Balance training, Gait training,   PLAN FOR NEXT SESSION: continue hip/proximal strengthening and flexibility work,     Terrilee Few, PT, DPT 1:04 PM  06/16/23   PHYSICAL THERAPY DISCHARGE SUMMARY  Visits from Start of Care: 6   Plan: Patient agrees to discharge.  Patient goals were partially met. Patient is being discharged due to not returning since last visit.   Terrilee Few, PT, DPT 3:17 PM  01/27/24

## 2023-06-23 ENCOUNTER — Encounter: Payer: Medicare PPO | Admitting: Physical Therapy

## 2023-06-23 NOTE — Progress Notes (Deleted)
Tina [facility] [address] [place] [address] Phone: [phone]   Assessment and Plan:     There are no diagnoses linked to this encounter.  ***   Pertinent previous records reviewed include ***   Follow Up: ***     Subjective:   I, Tina Kelley, am serving as a Neurosurgeon for Doctor Richardean Sale   Chief Complaint: back pain    HPI:    11/10/2022 Patient is a 72 year old female complaining of back pain. 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 Developed pain of R GT. Pain worse when she gets out of car, stairs and sit to stand. Does have intermittent radiating pain down to her R foot.    03/09/2023 Patient states felt great for 10 days then the pain came back    03/29/2023 Patient states that the prednisone really helped would like a refill    06/24/2023 Patient states   Relevant Historical Information: Hypertension, GERD  Additional pertinent review of systems negative.   Current Outpatient Medications:    acetaminophen (TYLENOL) 500 MG tablet, Take 500 mg by mouth every 6 (six) hours as needed for moderate pain., Disp: , Rfl:    albuterol (VENTOLIN HFA) 108 (90 Base) MCG/ACT inhaler, INHALE 2 PUFFS INTO THE LUNGS EVERY 6 HOURS AS NEEDED FOR WHEEZING OR SHORTNESS OF BREATH, Disp: 18 g, Rfl: 1   buPROPion (WELLBUTRIN SR) 150 MG 12 hr tablet, TAKE ONE TABLET BY MOUTH TWICE A DAY, Disp: 180 tablet, Rfl: 3   cholestyramine (QUESTRAN) 4 g packet, Take 0.5 packets (2 g total) by mouth 2 (two) times daily as needed., Disp: 60 each, Rfl: 11   EPINEPHrine  (EPIPEN 2-PAK) 0.3 mg/0.3 mL IJ SOAJ injection, Inject 0.3 mg into the muscle as needed for anaphylaxis. Use as instructed., Disp: 1 each, Rfl: 1   Lactobacillus Rhamnosus, GG, (CULTURELLE) CAPS, Take 1 capsule by mouth daily., Disp: , Rfl:    losartan (COZAAR) 50 MG tablet, Take 0.5 tablets (25 mg total) by mouth in the morning and at bedtime., Disp: 90 tablet, Rfl: 3   metoprolol succinate (TOPROL-XL) 50 MG 24 hr tablet, TAKE ONE TABLET BY MOUTH DAILY WITH OR IMMEDIATELY FOLLOWING A MEAL, Disp: 90 tablet, Rfl: 3   mupirocin ointment (BACTROBAN) 2 %, Apply topically 2 (two) times daily., Disp: 15 g, Rfl: 0   pantoprazole (PROTONIX) 40 MG tablet, TAKE 1 TABLET BY MOUTH 2 TIMES A DAY, Disp: 180 tablet, Rfl: 3   Polyvinyl Alcohol-Povidone (REFRESH OP), Place 1 drop into both eyes daily as needed (dry eyes)., Disp: , Rfl:    RESTASIS 0.05 % ophthalmic emulsion, , Disp: , Rfl:    rosuvastatin (CRESTOR) 10 MG tablet, Take 2 tablets (20 mg total) by mouth daily., Disp: 180 tablet, Rfl: 3   sodium chloride (OCEAN) 0.65 % SOLN nasal spray, Place 1 spray into both nostrils as needed for congestion., Disp: , Rfl:    zolpidem (AMBIEN) 10 MG tablet, Take 1 tablet (10 mg total) by mouth at bedtime as needed for sleep., Disp:  30 tablet, Rfl: 5   Objective:     There were no vitals filed for this visit.    There is no height or weight on file to calculate BMI.    Physical Exam:    ***   Electronically signed by:  Tina Kelley.[facility] 7:43 AM 06/23/23

## 2023-06-24 ENCOUNTER — Ambulatory Visit: Payer: Medicare PPO | Admitting: Sports Medicine

## 2023-07-02 ENCOUNTER — Ambulatory Visit: Payer: Medicare PPO | Admitting: Family Medicine

## 2023-07-22 ENCOUNTER — Other Ambulatory Visit (HOSPITAL_BASED_OUTPATIENT_CLINIC_OR_DEPARTMENT_OTHER): Payer: Self-pay | Admitting: Family

## 2023-08-12 ENCOUNTER — Ambulatory Visit (HOSPITAL_BASED_OUTPATIENT_CLINIC_OR_DEPARTMENT_OTHER): Payer: Medicare PPO | Admitting: Cardiology

## 2023-08-12 ENCOUNTER — Encounter (HOSPITAL_BASED_OUTPATIENT_CLINIC_OR_DEPARTMENT_OTHER): Payer: Self-pay | Admitting: Cardiology

## 2023-08-12 VITALS — BP 116/74 | HR 63 | Ht 65.0 in | Wt 145.2 lb

## 2023-08-12 DIAGNOSIS — I10 Essential (primary) hypertension: Secondary | ICD-10-CM

## 2023-08-12 DIAGNOSIS — Z7189 Other specified counseling: Secondary | ICD-10-CM

## 2023-08-12 DIAGNOSIS — E782 Mixed hyperlipidemia: Secondary | ICD-10-CM | POA: Diagnosis not present

## 2023-08-12 DIAGNOSIS — Z8249 Family history of ischemic heart disease and other diseases of the circulatory system: Secondary | ICD-10-CM | POA: Diagnosis not present

## 2023-08-12 DIAGNOSIS — R002 Palpitations: Secondary | ICD-10-CM | POA: Diagnosis not present

## 2023-08-12 MED ORDER — ROSUVASTATIN CALCIUM 10 MG PO TABS: 10.0000 mg | ORAL_TABLET | Freq: Every day | ORAL | Status: DC

## 2023-08-12 NOTE — Progress Notes (Signed)
Cardiology Office Note:  .   Date:  08/12/2023  ID:  Tina Kelley, DOB 07-Nov-1950, MRN [id number] PCP: Willow Ora, MD  [facility] Providers Cardiologist:  Jodelle Red, MD {  History of Present Illness: .   Tina Kelley is a 72 y.o. female with a hx of hypertension, palpitations, premature FH heart disease, who is seen for follow-up today. She was initially seen 08/06/2021 as a new consult at the request of Willow Ora, MD to establish care. She is concerned about her cardiovascular risk.   Cardiovascular risk factors: Prior clinical ASCVD: None Comorbid conditions: Hypertension, borderline hyperlipidemia, Family history: Father had 1st heart attack at 65 yo, 2nd heart attack, CABG, CHF, died at 73 yo. Paternal uncles died of heart attack and stroke. Paternal grandfather had a heart attack. Mother died of small-cell lung cancer. Maternal grandmother had pancreatic cancer.   Pertinent CV history: stress test 2018 ([place]) normal.  Today: Walks 30 mins three times a week. Bursitis in her hips limits her, getting PT. Has GERD, has had discomfort in her upper throat, pending EGD and capsule study. Blood pressure well controlled. Lipids were elevated early this year, went on 20 mg rosuvastatin but had muscle discomfort. She went back to 10 mg dose, hasn't noticed a big change in symptoms. Pending repeat lipids in January. Lipids in September showed LDL 82.  Palpitations are well managed and nonlimiting at this time.   ROS: Denies non-GERD chest pain, shortness of breath at rest or with normal exertion. No PND, orthopnea, LE edema or unexpected weight gain. No syncope. ROS otherwise negative except as noted.   Studies Reviewed: Marland Kitchen    EKG:  EKG Interpretation Date/Time:  Thursday August 12 2023 09:06:52 EST Ventricular Rate:  63 PR Interval:  182 QRS Duration:  84 QT Interval:  398 QTC Calculation: 407 R Axis:   44  Text Interpretation: Normal sinus  rhythm Normal ECG Confirmed by Jodelle Red [phone]) on 08/12/2023 9:28:00 AM    Physical Exam:   VS:  BP 116/74 (BP Location: Right Arm, Patient Position: Sitting, Cuff Size: Normal)   Pulse 63   Ht 5\' 5"  (1.651 m)   Wt 145 lb 3.2 oz (65.9 kg)   SpO2 96%   BMI 24.16 kg/m    Wt Readings from Last 3 Encounters:  08/12/23 145 lb 3.2 oz (65.9 kg)  06/09/23 141 lb 9.6 oz (64.2 kg)  04/06/23 141 lb 6 oz (64.1 kg)    GEN: Well nourished, well developed in no acute distress HEENT: Normal, moist mucous membranes NECK: No JVD CARDIAC: regular rhythm, normal S1 and S2, no rubs or gallops. No murmur. VASCULAR: Radial and DP pulses 2+ bilaterally. No carotid bruits RESPIRATORY:  Clear to auscultation without rales, wheezing or rhonchi  ABDOMEN: Soft, non-tender, non-distended MUSCULOSKELETAL:  Ambulates independently SKIN: Warm and dry, no edema NEUROLOGIC:  Alert and oriented x 3. No focal neuro deficits noted. PSYCHIATRIC:  Normal affect    ASSESSMENT AND PLAN: .    Palpitations/fluttering -on metoprolol succinate 50 mg daily, generally well controlled   Hyperlipidemia -on rosuvastatin, currently taking 10 mg daily -cholestyramine is for chronic diarrhea   Hypertension -at goal today -continue losartan 50 mg daily   Family history of premature CV disease -she feels her chest discomfort is atypical at this time, but we did discuss further testing if symptoms either become more pronounced or more frequent -calcium score has been discussed, but as she is on a statin,  this would not change management -reviewed red flag warning signs that need immediate medical attention   CV risk counseling and prevention -recommend heart healthy/Mediterranean diet, with whole grains, fruits, vegetable, fish, lean meats, nuts, and olive oil. Limit salt. -recommend moderate walking, 3-5 times/week for 30-50 minutes each session. Aim for at least 150 minutes.week. Goal should be pace of 3  miles/hours, or walking 1.5 miles in 30 minutes -recommend avoidance of tobacco products. Avoid excess alcohol. -ASCVD risk score: The 10-year ASCVD risk score (Arnett DK, et al., 2019) is: 13.8%   Values used to calculate the score:     Age: 37 years     Sex: Female     Is Non-Hispanic African American: No     Diabetic: No     Tobacco smoker: No     Systolic Blood Pressure: 116 mmHg     Is BP treated: Yes     HDL Cholesterol: 76.3 mg/dL     Total Cholesterol: 184 mg/dL    Dispo: 1 year or sooner as needed  Signed, Jodelle Red, MD   Jodelle Red, MD, PhD, [facility]  [facility]  [place]  [facility] & Vascular at [facility] at [facility] [address] [place], [place] [address] [phone]

## 2023-08-12 NOTE — Patient Instructions (Signed)
Medication Instructions:  Your physician recommends that you continue on your current medications as directed. Please refer to the Current Medication list given to you today.  *If you need a refill on your cardiac medications before your next appointment, please call your pharmacy*   Follow-Up: At [facility], you and your health needs are our priority.  As part of our continuing mission to provide you with exceptional heart care, we have created designated Provider Care Teams.  These Care Teams include your primary Cardiologist (physician) and Advanced Practice Providers (APPs -  Physician Assistants and Nurse Practitioners) who all work together to provide you with the care you need, when you need it.  We recommend signing up for the patient portal called "MyChart".  Sign up information is provided on this After Visit Summary.  MyChart is used to connect with patients for Virtual Visits (Telemedicine).  Patients are able to view lab/test results, encounter notes, upcoming appointments, etc.  Non-urgent messages can be sent to your provider as well.   To learn more about what you can do with MyChart, go to [email or web address].    Your next appointment:   12 month(s)  Provider:   Buford Dresser, MD

## 2023-08-31 DIAGNOSIS — M8588 Other specified disorders of bone density and structure, other site: Secondary | ICD-10-CM | POA: Diagnosis not present

## 2023-08-31 DIAGNOSIS — N958 Other specified menopausal and perimenopausal disorders: Secondary | ICD-10-CM | POA: Diagnosis not present

## 2023-09-02 ENCOUNTER — Encounter: Payer: Self-pay | Admitting: Family Medicine

## 2023-09-02 ENCOUNTER — Ambulatory Visit: Payer: Medicare PPO | Admitting: Family Medicine

## 2023-09-02 VITALS — BP 116/88 | HR 63 | Ht 65.0 in | Wt 144.0 lb

## 2023-09-02 DIAGNOSIS — J0100 Acute maxillary sinusitis, unspecified: Secondary | ICD-10-CM

## 2023-09-02 DIAGNOSIS — M7062 Trochanteric bursitis, left hip: Secondary | ICD-10-CM

## 2023-09-02 DIAGNOSIS — M1991 Primary osteoarthritis, unspecified site: Secondary | ICD-10-CM | POA: Diagnosis not present

## 2023-09-02 DIAGNOSIS — J4520 Mild intermittent asthma, uncomplicated: Secondary | ICD-10-CM | POA: Diagnosis not present

## 2023-09-02 DIAGNOSIS — M7061 Trochanteric bursitis, right hip: Secondary | ICD-10-CM

## 2023-09-02 MED ORDER — DOXYCYCLINE HYCLATE 100 MG PO TABS: 100.0000 mg | ORAL_TABLET | Freq: Two times a day (BID) | ORAL | 0 refills | Status: AC

## 2023-09-02 MED ORDER — PREDNISONE 20 MG PO TABS: ORAL_TABLET | ORAL | 0 refills | Status: DC

## 2023-09-02 MED ORDER — ZOLPIDEM TARTRATE 10 MG PO TABS: 10.0000 mg | ORAL_TABLET | Freq: Every evening | ORAL | 5 refills | Status: DC | PRN

## 2023-09-02 MED ORDER — CELECOXIB 100 MG PO CAPS: 100.0000 mg | ORAL_CAPSULE | Freq: Every day | ORAL | 5 refills | Status: DC

## 2023-09-02 NOTE — Progress Notes (Signed)
Subjective  CC:  Chief Complaint  Patient presents with   Hip Pain   Sinus Problem    Pt stated that she has some sinus problems going for the past 2 weeks. Pt has tested for COVID(negative). Pt stated that she had her DEXA yesterday but I do not seen results yet    HPI: Tina Kelley is a 72 y.o. female who presents to the office today to address the problems listed above in the chief complaint. Discussed the use of AI scribe software for clinical note transcription with the patient, who gave verbal consent to proceed.  History of Present Illness   The patient, with a history of sinus issues, presents with a two-week history of sinus discomfort, characterized by a headache that worsens upon bending down. She initially attributed the symptoms to allergies due to associated eye watering. She denies any COVID-19 symptoms, having undergone three negative tests. The patient also reports a history of adverse reactions to certain antibiotics, necessitating a change in her treatment regimen.  In addition to the sinus issues, the patient has been experiencing intermittent hip pain, which is exacerbated by prolonged standing.  Diagnosed with bilateral trochanteric bursitis, has had 2 steroid injections in the last year and tends to get better with prednisone tapers.  She tends to avoid NSAIDs because she fears that it would interfere with her asthma, she was told this decades ago.  No new symptoms.  Lateral hip pain intermittently persists on and off.  She has been managing the pain with exercises, which provide temporary relief. The patient has a history of muscle pain associated with Crestor use, which has improved slightly since the dose was adjusted.  Insomnia follow-up: The patient also reports a need for a new prescription for Zolpidem, as her current prescription is due to run out. She has been using this medication for sleep management.  This works well for her.     Assessment  1. Trochanteric  bursitis of both hips   2. Primary localized osteoarthrosis of multiple sites   3. Mild intermittent asthma without complication   4. Acute non-recurrent maxillary sinusitis      Plan  Assessment and Plan    Acute Sinusitis Sinus symptoms for two weeks, including head pain when bending, eye discharge, and fever. Multiple negative COVID tests. Previous adverse reaction to cephalosporin causing stomach pain. Discussed need for a different antibiotic and potential use of prednisone. Prefers a gentle antibiotic due to past adverse reactions. - Prescribe doxycycline x 2 weeks.  He - Prescribe prednisone 7-day taper and recommend supportive care  Hip Pain/bilateral hip bursitis, greater trochanteric Chronic hip pain exacerbated by prolonged standing and physical activity. Previous steroid injection in May provided temporary relief. Lowered dose of Crestor due to muscle pain, somewhat effective. Discussed potential use of NSAIDs despite asthma exacerbation with aspirin and ibuprofen. Prefers non-NSAID options but open to trying meloxicam or Celebrex with caution. - Prescribe Celebrex 100 mg daily to start after 7-day prednisone taper.  Exercise and stretching and icing recommended - Recommend massage therapy  Insomnia Requires new prescription for zolpidem (Ambien) as current prescription is expiring. - Renew prescription for zolpidem 10 mg nightly.  Understands risk versus benefits.  This is well-tolerated for her  General Health Maintenance Expecting a visit from [facility] for insurance review. Discussed importance of maintaining current insurance benefits. - Advise to comply with [employer]'s visit for insurance review  Follow-up - Follow-up appointment in March.     Follow up: As  scheduled for CPE 12/09/2023  No orders of the defined types were placed in this encounter.  Meds ordered this encounter  Medications   doxycycline (VIBRA-TABS) 100 MG tablet    Sig: Take 1 tablet (100 mg total)  by mouth 2 (two) times daily for 10 days.    Dispense:  20 tablet    Refill:  0   predniSONE (DELTASONE) 20 MG tablet    Sig: Take 2 tabs daily for 5 days    Dispense:  10 tablet    Refill:  0   celecoxib (CELEBREX) 100 MG capsule    Sig: Take 1 capsule (100 mg total) by mouth daily.    Dispense:  30 capsule    Refill:  5   zolpidem (AMBIEN) 10 MG tablet    Sig: Take 1 tablet (10 mg total) by mouth at bedtime as needed for sleep.    Dispense:  30 tablet    Refill:  5      I reviewed the patients updated PMH, FH, and SocHx.    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    Priority: Medium    Primary localized osteoarthrosis of multiple sites 09/04/2019    Priority: Medium    Mild intermittent asthma without complication 03/29/2019    Priority: Medium    Gastroesophageal reflux disease 10/29/2015    Priority: Medium    Dyspareunia 03/13/2013    Priority: Medium    Basal cell carcinoma of skin 02/29/2012    Priority: Medium    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    Priority: Low   ACE-inhibitor cough 11/25/2020    Priority: Low   Chronic allergic rhinitis 09/04/2019    Priority: Low   Dry eye syndrome 03/29/2019    Priority: Low   Postmenopausal atrophic vaginitis 03/13/2013    Priority: Low   Globus sensation 10/19/2022   Current Meds  Medication Sig   celecoxib (CELEBREX) 100 MG capsule Take 1 capsule (100 mg total) by mouth daily.   doxycycline  (VIBRA-TABS) 100 MG tablet Take 1 tablet (100 mg total) by mouth 2 (two) times daily for 10 days.   predniSONE (DELTASONE) 20 MG tablet Take 2 tabs daily for 5 days    Allergies: Patient is allergic to aspirin, nsaids, penicillins, yellow hornet venom [hornet venom], lisinopril, and clindamycin/lincomycin. Family History: Patient family history includes Healthy in her son and son; Heart attack in her father; Heart disease in her father; High blood pressure in her brother; Lung cancer in her mother. Social History:  Patient  reports that she has never smoked. She has never used smokeless tobacco. She reports current alcohol use. She reports that she does not use drugs.  Review of Systems: Constitutional: Negative for fever malaise or anorexia Cardiovascular: negative for chest pain Respiratory: negative for SOB or persistent cough Gastrointestinal: negative for abdominal pain  Objective  Vitals: BP 116/88   Pulse 63   Ht 5'  5" (1.651 m)   Wt 144 lb (65.3 kg)   SpO2 97%   BMI 23.96 kg/m  General: no acute distress , A&Ox3 HEENT: PEERL, conjunctiva normal, neck is supple, sinus tenderness present Cardiovascular:  RRR without murmur or gallop.  Respiratory:  Good breath sounds bilaterally, CTAB with normal respiratory effort Skin:  Warm, no rashes  Commons side effects, risks, benefits, and alternatives for medications and treatment plan prescribed today were discussed, and the patient expressed understanding of the given instructions. Patient is instructed to call or message via MyChart if he/she has any questions or concerns regarding our treatment plan. No barriers to understanding were identified. We discussed Red Flag symptoms and signs in detail. Patient expressed understanding regarding what to do in case of urgent or emergency type symptoms.  Medication list was reconciled, printed and provided to the patient in AVS. Patient instructions and summary information was reviewed with  the patient as documented in the AVS. This note was prepared with assistance of Dragon voice recognition software. Occasional wrong-word or sound-a-like substitutions may have occurred due to the inherent limitations of voice recognition software

## 2023-09-02 NOTE — Patient Instructions (Signed)
Please follow up as scheduled for your next visit with me: 12/09/2023   If you have any questions or concerns, please don't hesitate to send me a message via MyChart or call the office at [phone]. Thank you for visiting with [place] today! It's our pleasure caring for you.   VISIT SUMMARY:  During today's visit, we discussed your ongoing sinus discomfort, hip pain, and need for a new prescription for your sleep medication. We also reviewed your general health maintenance and upcoming insurance review.  YOUR PLAN:  -ACUTE SINUSITIS: Acute sinusitis is an infection or inflammation of the sinuses, causing symptoms like headache, eye discharge, and fever. Given your history of adverse reactions to certain antibiotics, we will prescribe a gentle alternative antibiotic and prednisone to help reduce inflammation.  -HIP PAIN: Chronic hip pain can be caused by various factors, including prolonged standing and physical activity. We will prescribe Celebrex to help manage the pain and recommend massage therapy for additional relief.  Monitor your breathing to see if it bothers your asthma. It may not.  -INSOMNIA: Insomnia is difficulty falling or staying asleep. We will renew your prescription for zolpidem (Ambien) to help manage your sleep issues.  -GENERAL HEALTH MAINTENANCE: We discussed the importance of maintaining your current insurance benefits and advised you to comply with the upcoming visit from [facility] for an insurance review.  INSTRUCTIONS:  Please schedule a follow-up appointment in March to review your progress and any adjustments needed in your treatment plan.

## 2023-09-14 DIAGNOSIS — L814 Other melanin hyperpigmentation: Secondary | ICD-10-CM | POA: Diagnosis not present

## 2023-09-14 DIAGNOSIS — L821 Other seborrheic keratosis: Secondary | ICD-10-CM | POA: Diagnosis not present

## 2023-09-14 DIAGNOSIS — D225 Melanocytic nevi of trunk: Secondary | ICD-10-CM | POA: Diagnosis not present

## 2023-09-18 ENCOUNTER — Other Ambulatory Visit: Payer: Self-pay | Admitting: Family Medicine

## 2023-10-08 ENCOUNTER — Encounter: Payer: Self-pay | Admitting: Internal Medicine

## 2023-10-08 ENCOUNTER — Ambulatory Visit: Payer: Medicare PPO | Admitting: Internal Medicine

## 2023-10-08 VITALS — BP 120/70 | HR 84 | Ht 65.0 in | Wt 145.0 lb

## 2023-10-08 DIAGNOSIS — R197 Diarrhea, unspecified: Secondary | ICD-10-CM

## 2023-10-08 DIAGNOSIS — K219 Gastro-esophageal reflux disease without esophagitis: Secondary | ICD-10-CM | POA: Diagnosis not present

## 2023-10-08 DIAGNOSIS — J3489 Other specified disorders of nose and nasal sinuses: Secondary | ICD-10-CM

## 2023-10-08 DIAGNOSIS — K9089 Other intestinal malabsorption: Secondary | ICD-10-CM

## 2023-10-08 NOTE — Progress Notes (Signed)
   Subjective:    Patient ID: Tina Kelley, female    DOB: [birth year], 73 y.o.   MRN: [id number]  HPI Tina Kelley is a 73 year old with a history of GERD, LPR, prior C. difficile, sinusitis, right thyroidectomy for adenoma, diverticulosis with history of diverticulitis who is here for follow-up.  I last saw her in July 2024 and she is here alone today.  The patient, with a history of gastroesophageal reflux disease (GERD) and sinusitis, reports an improvement in her symptoms. She describes a persistent phlegm sensation in her throat, which she believes is sinus-related. She denies frequent indigestion, noting it occurs approximately once every two weeks, typically triggered by chocolate. She reports no difficulty swallowing, except when consuming large pieces of food such as bread.  The patient has been adhering to a twice-daily regimen of pantoprazole , which she takes first thing in the morning and before bed, ensuring a gap of a few hours before meals. She reports being careful about the timing of her medication.  The patient also has a history of cholelithiasis, for which she had her gallbladder removed. She occasionally uses Questran  for episodes of loose stools, which she attributes to forgetting about her gallbladder removal and consuming foods like hamburgers. She reports a pattern of alternating constipation and normal bowel movements, with occasional episodes of loose stools. The use of Questran  is infrequent, approximately once or twice a month.  The patient also has a long-standing history of sinusitis, dating back to her childhood, and had nasal polyps removed twenty-five years ago. She reports colored sinus discharge in the mornings, which she clears. She has tried Zyrtec  and Singulair  in the past with limited success. She has also been prescribed antibiotics and prednisone , the latter of which she reports helps with all her inflammatory symptoms. However, she notes that antibiotics tend to  upset her stomach.   Review of Systems As per HPI, otherwise negative  Current Medications, Allergies, Past Medical History, Past Surgical History, Family History and Social History were reviewed in [employer] record.     Objective:   Physical Exam BP 120/70 (BP Location: Left Arm, Patient Position: Sitting)   Pulse 84   Ht 5' 5 (1.651 m)   Wt 145 lb (65.8 kg)   BMI 24.13 kg/m  Gen: awake, alert, NAD HEENT: anicteric  Neuro: nonfocal      Assessment & Plan:  73 year old with a history of GERD, LPR, prior C. difficile, sinusitis, right thyroidectomy for adenoma, diverticulosis with history of diverticulitis who is here for follow-up.   GERD/LPR/sinus drainage Likely multifactorial, possibly related to sinus issues. Symptoms have improved with Pantoprazole  40mg  twice daily. No alarm symptoms present. -Continue Pantoprazole  40mg  twice daily. -Consider adjusting timing of second dose to before dinner if symptoms persist.  Intermittent loose stools Possibly related to cholecystectomy. Using Questran  as needed with good effect. -Continue Questran  as needed for loose stools.  General Health Maintenance -Next colonoscopy due in 2028. -Continue current blood pressure management. -Maintain current weight.  Follow-up in 1 year or sooner if symptoms change.

## 2023-10-08 NOTE — Patient Instructions (Signed)
 Stay on pantoprazole  twice daily and cholestyramine  as needed.  _______________________________________________________  If your blood pressure at your visit was 140/90 or greater, please contact your primary care physician to follow up on this.  _______________________________________________________  If you are age 74 or older, your body mass index should be between 23-30. Your Body mass index is 24.13 kg/m. If this is out of the aforementioned range listed, please consider follow up with your Primary Care Provider.  If you are age 58 or younger, your body mass index should be between 19-25. Your Body mass index is 24.13 kg/m. If this is out of the aformentioned range listed, please consider follow up with your Primary Care Provider.   ________________________________________________________  The [facility] providers would like to encourage you to use MYCHART to communicate with providers for non-urgent requests or questions.  Due to long hold times on the telephone, sending your provider a message by [facility] may be a faster and more efficient way to get a response.  Please allow 48 business hours for a response.  Please remember that this is for non-urgent requests.  _______________________________________________________

## 2023-10-13 DIAGNOSIS — H0288A Meibomian gland dysfunction right eye, upper and lower eyelids: Secondary | ICD-10-CM | POA: Diagnosis not present

## 2023-10-13 DIAGNOSIS — H0288B Meibomian gland dysfunction left eye, upper and lower eyelids: Secondary | ICD-10-CM | POA: Diagnosis not present

## 2023-10-13 DIAGNOSIS — H16223 Keratoconjunctivitis sicca, not specified as Sjogren's, bilateral: Secondary | ICD-10-CM | POA: Diagnosis not present

## 2023-11-08 ENCOUNTER — Other Ambulatory Visit (HOSPITAL_COMMUNITY): Payer: Self-pay

## 2023-11-08 ENCOUNTER — Telehealth (HOSPITAL_COMMUNITY): Payer: Self-pay | Admitting: Pharmacy Technician

## 2023-11-08 NOTE — Telephone Encounter (Signed)
 Pharmacy Patient Advocate Encounter  Received notification from [employer] that Prior Authorization for ROSUVASTATIN  10MG  TABLETS has been APPROVED from 09/29/2023 to 09/27/2024. Ran test claim, Copay is $6.34. This test claim was processed through [facility]- copay amounts may vary at other pharmacies due to pharmacy/plan contracts, or as the patient moves through the different stages of their insurance plan.

## 2023-11-10 ENCOUNTER — Ambulatory Visit: Payer: Medicare PPO

## 2023-11-10 VITALS — Wt 145.0 lb

## 2023-11-10 DIAGNOSIS — Z0000 Encounter for general adult medical examination without abnormal findings: Secondary | ICD-10-CM | POA: Diagnosis not present

## 2023-11-10 NOTE — Patient Instructions (Signed)
Tina Kelley , Thank you for taking time to come for your Medicare Wellness Visit. I appreciate your ongoing commitment to your health goals. Please review the following plan we discussed and let me know if I can assist you in the future.   Referrals/Orders/Follow-Ups/Clinician Recommendations: Aim for 30 minutes of exercise or brisk walking, 6-8 glasses of water, and 5 servings of fruits and vegetables each day.   This is a list of the screening recommended for you and due dates:  Health Maintenance  Topic Date Due   COVID-19 Vaccine (6 - 2024-25 season) 05/30/2023   DEXA scan (bone density measurement)  06/10/2023   Mammogram  05/26/2024   Medicare Annual Wellness Visit  11/09/2024   Colon Cancer Screening  10/26/2026   Pneumonia Vaccine  Completed   Flu Shot  Completed   Hepatitis C Screening  Completed   Zoster (Shingles) Vaccine  Completed   HPV Vaccine  Aged Out   DTaP/Tdap/Td vaccine  Discontinued    Advanced directives: (In Chart) A copy of your advanced directives are scanned into your chart should your provider ever need it.  Next Medicare Annual Wellness Visit scheduled for next year: Yes

## 2023-11-10 NOTE — Progress Notes (Signed)
Subjective:   Tina Kelley is a 73 y.o. female who presents for Medicare Annual (Subsequent) preventive examination.  Visit Complete: Virtual I connected with  Lagena Clubb on 11/10/23 by a video and audio enabled telemedicine application and verified that I am speaking with the correct person using two identifiers.  Patient Location: Home  Provider Location: Home Office  I discussed the limitations of evaluation and management by telemedicine. The patient expressed understanding and agreed to proceed.  Vital Signs: Because this visit was a virtual/telehealth visit, some criteria may be missing or patient reported. Any vitals not documented were not able to be obtained and vitals that have been documented are patient reported.  Patient Medicare AWV questionnaire was completed by the patient on 11/08/23; I have confirmed that all information answered by patient is correct and no changes since this date.        Objective:    Today's Vitals   11/10/23 0951  Weight: 145 lb (65.8 kg)   Body mass index is 24.13 kg/m.     11/10/2023    9:58 AM 05/06/2023   12:25 PM 12/23/2022    1:42 PM 11/09/2022    8:39 AM 12/08/2021    2:25 PM 12/04/2021    9:55 AM 10/27/2021    8:45 AM  Advanced Directives  Does Patient Have a Medical Advance Directive? Yes No No Yes Yes Yes Yes  Type of Estate agent of [place];Living will   Healthcare Power of [place];Living will Healthcare Power of [employer] of [place];Living will Healthcare Power of Attorney  Does patient want to make changes to medical advance directive? No - Patient declined   No - Patient declined No - Patient declined No - Patient declined   Copy of Healthcare Power of Attorney in Chart? Yes - validated most recent copy scanned in chart (See row information)   Yes - validated most recent copy scanned in chart (See row information) No - copy requested No - copy requested Yes - validated most recent copy  scanned in chart (See row information)  Would patient like information on creating a medical advance directive? No - Patient declined No - Patient declined No - Patient declined        Current Medications (verified) Outpatient Encounter Medications as of 11/10/2023  Medication Sig   acetaminophen (TYLENOL) 500 MG tablet Take 500 mg by mouth every 6 (six) hours as needed for moderate pain.   albuterol (VENTOLIN HFA) 108 (90 Base) MCG/ACT inhaler INHALE 2 PUFFS INTO THE LUNGS EVERY 6 HOURS AS NEEDED FOR WHEEZING OR SHORTNESS OF BREATH   buPROPion (WELLBUTRIN SR) 150 MG 12 hr tablet TAKE 1 TABLET BY MOUTH TWICE A DAY   cholestyramine (QUESTRAN) 4 g packet Take 0.5 packets (2 g total) by mouth 2 (two) times daily as needed.   clobetasol ointment (TEMOVATE) 0.05 % Apply 1 Application topically 2 (two) times daily.   Lactobacillus Rhamnosus, GG, (CULTURELLE) CAPS Take 1 capsule by mouth daily.   losartan (COZAAR) 50 MG tablet TAKE ONE HALF TABLET (25MG ) BY MOUTH EVERY MORNING AND TAKE ONE HALF TABLET (25MG ) BY MOUTH EVERY NIGHT AT BEDTIME   metoprolol succinate (TOPROL-XL) 50 MG 24 hr tablet TAKE ONE TABLET BY MOUTH DAILY WITH OR IMMEDIATELY FOLLOWING A MEAL   mupirocin ointment (BACTROBAN) 2 % Apply topically 2 (two) times daily.   pantoprazole (PROTONIX) 40 MG tablet TAKE 1 TABLET BY MOUTH 2 TIMES A DAY   Polyvinyl Alcohol-Povidone (REFRESH OP) Place 1  drop into both eyes daily as needed (dry eyes).   prednisoLONE acetate (PRED FORTE) 1 % ophthalmic suspension Place 1 drop into both eyes as needed.   RESTASIS 0.05 % ophthalmic emulsion    rosuvastatin (CRESTOR) 10 MG tablet Take 1 tablet (10 mg total) by mouth daily.   sodium chloride (OCEAN) 0.65 % SOLN nasal spray Place 1 spray into both nostrils as needed for congestion.   zolpidem (AMBIEN) 10 MG tablet Take 1 tablet (10 mg total) by mouth at bedtime as needed for sleep.   EPINEPHrine (EPIPEN 2-PAK) 0.3 mg/0.3 mL IJ SOAJ injection Inject 0.3 mg  into the muscle as needed for anaphylaxis. Use as instructed. (Patient not taking: Reported on 11/10/2023)   [DISCONTINUED] celecoxib (CELEBREX) 100 MG capsule Take 1 capsule (100 mg total) by mouth daily. (Patient not taking: Reported on 10/08/2023)   No facility-administered encounter medications on file as of 11/10/2023.    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   Primary insomnia 11/16/2017   Seasonal allergic rhinitis due to pollen 03/29/2019   Past Surgical History:  Procedure Laterality Date   CHOLECYSTECTOMY     COLONOSCOPY     x2   NASAL SINUS SURGERY     THYROIDECTOMY Right 12/08/2021   Procedure: THYROID LOBECTOMY;  Surgeon: Serena Colonel, MD;  Location: [facility] OR;  Service: ENT;  Laterality: Right;   UPPER GASTROINTESTINAL ENDOSCOPY     Family History  Problem Relation Age of Onset   Lung cancer Mother    Heart disease Father    Heart attack Father    High blood pressure Brother    Healthy Son    Healthy Son    Colon cancer Neg Hx    Esophageal cancer Neg Hx    Rectal cancer Neg Hx    Stomach cancer Neg Hx    Pancreatic cancer Neg Hx    Social History   Socioeconomic History   Marital status: Widowed    Spouse name: Not on file   Number of children: 2   Years of education: Not on file   Highest education level: Bachelor's degree (e.g., BA, AB, BS)  Occupational History   Occupation: retired  Runner, broadcasting/film/video  Tobacco Use   Smoking status: Never   Smokeless tobacco: Never  Vaping Use   Vaping status: Never Used  Substance and Sexual Activity   Alcohol use: Yes    Comment: a glass of wine a couple times per week   Drug use: Never   Sexual activity: Yes    Birth control/protection: Post-menopausal  Other Topics Concern   Not on file  Social History Narrative   Not on file   Social Drivers of Health   Financial Resource Strain: Low Risk  (11/08/2023)   Overall Financial Resource Strain (CARDIA)    Difficulty of Paying Living Expenses: Not hard at all  Food Insecurity: No Food Insecurity (11/08/2023)   Hunger Vital Sign    Worried About Running Out  of Food in the Last Year: Never true    Ran Out of Food in the Last Year: Never true  Transportation Needs: No Transportation Needs (11/08/2023)   PRAPARE - Administrator, Civil Service (Medical): No    Lack of Transportation (Non-Medical): No  Physical Activity: Insufficiently Active (11/08/2023)   Exercise Vital Sign    Days of Exercise per Week: 3 days    Minutes of Exercise per Session: 30 min  Stress: No Stress Concern Present (11/08/2023)   [employer] of Occupational Health - Occupational Stress Questionnaire    Feeling of Stress : Only a little  Recent Concern: Stress - Stress Concern Present (08/29/2023)   [employer] of Occupational Health - Occupational Stress Questionnaire    Feeling of Stress : To some extent  Social Connections: Moderately Isolated (11/08/2023)   Social Connection and Isolation Panel [NHANES]    Frequency of Communication with Friends and Family: More than three times a week    Frequency of Social Gatherings with Friends and Family: Once a week    Attends Religious Services: More than 4 times per year    Active Member of [employer] or Organizations: No    Attends Banker Meetings: Not on file    Marital Status: Widowed    Tobacco Counseling Counseling given: Not  Answered   Clinical Intake:  Pre-visit preparation completed: Yes  Pain : No/denies pain     BMI - recorded: 24.13 Nutritional Status: BMI of 19-24  Normal Nutritional Risks: None Diabetes: No  How often do you need to have someone help you when you read instructions, pamphlets, or other written materials from your doctor or pharmacy?: 1 - Never  Interpreter Needed?: No  Information entered by :: Lanier Ensign, LPN   Activities of Daily Living    11/08/2023    1:38 AM  In your present state of health, do you have any difficulty performing the following activities:  Hearing? 0  Vision? 0  Difficulty concentrating or making decisions? 0  Walking or climbing stairs? 0  Dressing or bathing? 0  Doing errands, shopping? 0  Preparing Food and eating ? N  Using the Toilet? N  In the past six months, have you accidently leaked urine? N  Do you have problems with loss of bowel control? N  Managing your Medications? N  Managing your Finances? N  Housekeeping or managing your Housekeeping? N    Patient Care Team: Willow Ora, MD as PCP - General (Family Medicine) Jodelle Red, MD as PCP - Cardiology (Cardiology) Orma Flaming, MD as Consulting Physician (Obstetrics and Gynecology) Fuller Plan, Jolaine Click, FNP as Consulting Physician (Dermatology) Vivi Barrack, DPM as Consulting Physician (Podiatry) Pyrtle, Carie Caddy, MD as Consulting Physician (Gastroenterology) Serena Colonel, MD as Consulting Physician (Otolaryngology) Richardean Sale, DO as Consulting Physician (Sports Medicine)  Indicate any recent Medical Services you may have received from other than [facility] providers in the past year (date may be approximate).     Assessment:   This is a routine wellness examination for Lyliana.  Hearing/Vision screen Hearing Screening - Comments:: Pt denies any hearing issues Vision Screening - Comments:: Pt follows up with Dr Martha Clan for annual eye exams    Goals  Addressed             This Visit's Progress    Patient Stated       Blood sugar level down and remain healthy       Depression  Screen    11/10/2023    9:57 AM 09/02/2023    9:02 AM 06/09/2023   10:27 AM 06/09/2023   10:25 AM 12/02/2022    9:25 AM 11/09/2022    8:37 AM 10/19/2022   11:18 AM  PHQ 2/9 Scores  PHQ - 2 Score 0 2 2 0 3 0 0  PHQ- 9 Score 0 5 6  6       Fall Risk    11/08/2023    1:38 AM 09/02/2023    9:02 AM 06/09/2023   10:25 AM 12/02/2022    9:25 AM 11/05/2022    8:50 AM  Fall Risk   Falls in the past year? 1 0 0 0 0  Number falls in past yr: 0 0 0 0 0  Injury with Fall? 0 0 0 0 0  Risk for fall due to : No Fall Risks;History of fall(s) No Fall Risks No Fall Risks No Fall Risks   Follow up Falls prevention discussed Falls evaluation completed Falls evaluation completed Falls evaluation completed     MEDICARE RISK AT HOME: Medicare Risk at Home Any stairs in or around the home?: (Patient-Rptd) Yes If so, are there any without handrails?: (Patient-Rptd) No Home free of loose throw rugs in walkways, pet beds, electrical cords, etc?: (Patient-Rptd) Yes Adequate lighting in your home to reduce risk of falls?: (Patient-Rptd) Yes Life alert?: (Patient-Rptd) No Use of a cane, walker or w/c?: (Patient-Rptd) No Grab bars in the bathroom?: (Patient-Rptd) Yes Shower chair or bench in shower?: (Patient-Rptd) Yes Elevated toilet seat or a handicapped toilet?: (Patient-Rptd) No  TIMED UP AND GO:  Was the test performed?  No    Cognitive Function:    08/07/2019    2:31 PM  MMSE - Mini Mental State Exam  Orientation to time 5  Orientation to Place 5  Registration 3  Attention/ Calculation 5  Recall 3  Language- name 2 objects 2  Language- repeat 1  Language- follow 3 step command 3  Language- read & follow direction 1  Write a sentence 1  Copy design 1  Total score 30        11/10/2023    9:59 AM 11/09/2022    8:40 AM 10/27/2021    8:47 AM 09/12/2020    3:28  PM  6CIT Screen  What Year? 0 points 0 points 0 points 0 points  What month? 0 points 0 points 0 points 0 points  What time? 0 points 0 points 0 points   Count back from 20 0 points 0 points 0 points 0 points  Months in reverse 0 points 0 points 0 points 0 points  Repeat phrase 0 points 0 points 0 points 0 points  Total Score 0 points 0 points 0 points     Immunizations Immunization History  Administered Date(s) Administered   Fluad Quad(high Dose 65+) 06/19/2019, 06/11/2020, 06/16/2021, 06/16/2022   Fluad Trivalent(High Dose 65+) 06/09/2023   Influenza Split 08/30/2012, 08/18/2013, 07/18/2014   Influenza, High Dose Seasonal PF 06/19/2019, 06/11/2020   Influenza, Seasonal, Injecte, Preservative Fre 07/16/2011   Influenza,inj,quad, With Preservative 07/01/2015, 06/30/2016, 06/28/2018   Moderna Covid-19 Vaccine Bivalent Booster 73yrs & up 07/24/2021   Moderna Sars-Covid-2 Vaccination 10/28/2019, 11/30/2019, 08/05/2020, 02/06/2021   Pneumococcal Conjugate-13 10/24/2014   Pneumococcal Polysaccharide-23 10/29/2015   Tdap 01/08/2011   Zoster Recombinant(Shingrix) 12/03/2020, 04/10/2021      Flu Vaccine status: Up to date  Pneumococcal vaccine status: Up to date  Covid-19 vaccine status: Information provided on  how to obtain vaccines.   Qualifies for Shingles Vaccine? Yes   Zostavax completed Yes   Shingrix Completed?: Yes  Screening Tests Health Maintenance  Topic Date Due   COVID-19 Vaccine (6 - 2024-25 season) 05/30/2023   DEXA SCAN  06/10/2023   MAMMOGRAM  05/26/2024   Medicare Annual Wellness (AWV)  11/09/2024   Colonoscopy  10/26/2026   Pneumonia Vaccine 7+ Years old  Completed   INFLUENZA VACCINE  Completed   Hepatitis C Screening  Completed   Zoster Vaccines- Shingrix  Completed   HPV VACCINES  Aged Out   DTaP/Tdap/Td  Discontinued    Health Maintenance  Health Maintenance Due  Topic Date Due   COVID-19 Vaccine (6 - 2024-25 season) 05/30/2023   DEXA SCAN   06/10/2023    Colorectal cancer screening: Type of screening: Colonoscopy. Completed 10/27/19. Repeat every 7 years  Mammogram status: Completed 05/27/23. Repeat every year  Pt stated completed bone density and will bring results at next visit   Additional Screening:  Hepatitis C Screening: Completed 11/28/21  Vision Screening: Recommended annual ophthalmology exams for early detection of glaucoma and other disorders of the eye. Is the patient up to date with their annual eye exam?  Yes  Who is the provider or what is the name of the office in which the patient attends annual eye exams? Dr Martha Clan  If pt is not established with a provider, would they like to be referred to a provider to establish care? No .   Dental Screening: Recommended annual dental exams for proper oral hygiene   Community Resource Referral / Chronic Care Management: CRR required this visit?  No   CCM required this visit?  No     Plan:     I have personally reviewed and noted the following in the patient's chart:   Medical and social history Use of alcohol, tobacco or illicit drugs  Current medications and supplements including opioid prescriptions. Patient is not currently taking opioid prescriptions. Functional ability and status Nutritional status Physical activity Advanced directives List of other physicians Hospitalizations, surgeries, and ER visits in previous 12 months Vitals Screenings to include cognitive, depression, and falls Referrals and appointments  In addition, I have reviewed and discussed with patient certain preventive protocols, quality metrics, and best practice recommendations. A written personalized care plan for preventive services as well as general preventive health recommendations were provided to patient.     Marzella Schlein, LPN   [id number]   After Visit Summary: (MyChart) Due to this being a telephonic visit, the after visit summary with patients personalized plan was  offered to patient via MyChart   Nurse Notes: none

## 2023-12-09 ENCOUNTER — Ambulatory Visit: Payer: Medicare PPO | Admitting: Family Medicine

## 2023-12-09 ENCOUNTER — Encounter: Payer: Self-pay | Admitting: Family Medicine

## 2023-12-09 VITALS — BP 124/78 | HR 68 | Temp 97.7°F | Ht 65.0 in | Wt 140.8 lb

## 2023-12-09 DIAGNOSIS — Z0001 Encounter for general adult medical examination with abnormal findings: Secondary | ICD-10-CM

## 2023-12-09 DIAGNOSIS — F411 Generalized anxiety disorder: Secondary | ICD-10-CM

## 2023-12-09 DIAGNOSIS — F5101 Primary insomnia: Secondary | ICD-10-CM

## 2023-12-09 DIAGNOSIS — E782 Mixed hyperlipidemia: Secondary | ICD-10-CM

## 2023-12-09 DIAGNOSIS — I10 Essential (primary) hypertension: Secondary | ICD-10-CM | POA: Diagnosis not present

## 2023-12-09 DIAGNOSIS — M47812 Spondylosis without myelopathy or radiculopathy, cervical region: Secondary | ICD-10-CM

## 2023-12-09 DIAGNOSIS — J4520 Mild intermittent asthma, uncomplicated: Secondary | ICD-10-CM

## 2023-12-09 DIAGNOSIS — K9089 Other intestinal malabsorption: Secondary | ICD-10-CM | POA: Diagnosis not present

## 2023-12-09 LAB — CBC WITH DIFFERENTIAL/PLATELET
Basophils Absolute: 0.1 10*3/uL (ref 0.0–0.1)
Basophils Relative: 0.8 % (ref 0.0–3.0)
Eosinophils Absolute: 0.1 10*3/uL (ref 0.0–0.7)
Eosinophils Relative: 2 % (ref 0.0–5.0)
HCT: 44.4 % (ref 36.0–46.0)
Hemoglobin: 14.6 g/dL (ref 12.0–15.0)
Lymphocytes Relative: 23.9 % (ref 12.0–46.0)
Lymphs Abs: 1.7 10*3/uL (ref 0.7–4.0)
MCHC: 32.9 g/dL (ref 30.0–36.0)
MCV: 96.6 fl (ref 78.0–100.0)
Monocytes Absolute: 0.6 10*3/uL (ref 0.1–1.0)
Monocytes Relative: 8.2 % (ref 3.0–12.0)
Neutro Abs: 4.7 10*3/uL (ref 1.4–7.7)
Neutrophils Relative %: 65.1 % (ref 43.0–77.0)
Platelets: 368 10*3/uL (ref 150.0–400.0)
RBC: 4.59 Mil/uL (ref 3.87–5.11)
RDW: 13.4 % (ref 11.5–15.5)
WBC: 7.2 10*3/uL (ref 4.0–10.5)

## 2023-12-09 LAB — COMPREHENSIVE METABOLIC PANEL
ALT: 15 U/L (ref 0–35)
AST: 19 U/L (ref 0–37)
Albumin: 4.2 g/dL (ref 3.5–5.2)
Alkaline Phosphatase: 79 U/L (ref 39–117)
BUN: 11 mg/dL (ref 6–23)
CO2: 29 meq/L (ref 19–32)
Calcium: 9.5 mg/dL (ref 8.4–10.5)
Chloride: 102 meq/L (ref 96–112)
Creatinine, Ser: 1.09 mg/dL (ref 0.40–1.20)
GFR: 50.17 mL/min — ABNORMAL LOW (ref >60.00)
Glucose, Bld: 125 mg/dL — ABNORMAL HIGH (ref 70–99)
Potassium: 4.2 meq/L (ref 3.5–5.1)
Sodium: 138 meq/L (ref 135–145)
Total Bilirubin: 0.5 mg/dL (ref 0.2–1.2)
Total Protein: 6.5 g/dL (ref 6.0–8.3)

## 2023-12-09 LAB — LIPID PANEL
Cholesterol: 268 mg/dL — ABNORMAL HIGH (ref 0–200)
HDL: 64.9 mg/dL (ref >39.00)
LDL Cholesterol: 161 mg/dL — ABNORMAL HIGH (ref 0–99)
NonHDL: 203.38
Total CHOL/HDL Ratio: 4
Triglycerides: 213 mg/dL — ABNORMAL HIGH (ref 0.0–149.0)
VLDL: 42.6 mg/dL — ABNORMAL HIGH (ref 0.0–40.0)

## 2023-12-09 LAB — TSH: TSH: 3.04 u[IU]/mL (ref 0.35–5.50)

## 2023-12-09 MED ORDER — MONTELUKAST SODIUM 10 MG PO TABS: 10.0000 mg | ORAL_TABLET | Freq: Every day | ORAL | 3 refills | Status: DC

## 2023-12-09 NOTE — Progress Notes (Signed)
 Subjective  Chief Complaint  Patient presents with   Annual Exam    Pt here for Annual Exam and is currently fasting    Hypertension    HPI: Tina Kelley is a 73 y.o. female who presents to [facility] today for a Female Wellness Visit. She also has the concerns and/or needs as listed above in the chief complaint. These will be addressed in addition to the Health Maintenance Visit.   Wellness Visit: annual visit with health maintenance review and exam  HM: sees GYN: had mammo and dexa last month. Called for records. Nl by pt report. Doing well overall!imms up to date.   Chronic disease f/u and/or acute problem visit: (deemed necessary to be done in addition to the wellness visit): Discussed the use of AI scribe software for clinical note transcription with the patient, who gave verbal consent to proceed.  History of Present Illness   The patient is a 73 year old with allergies and asthma who presents with worsening allergy symptoms.  She experiences severe allergy symptoms, including constant eye irritation and nasal congestion. Her eyes are always running and itching, and her nose is frequently running. She has been consulting with an eye doctor and is scheduled to see an ophthalmologist soon. Currently, she uses Flonase nasal spray and Aloe vera for her allergies. She has not tried Singulair or montelukast before. She experiences some sneezing, which was a concern after having a tooth extraction last week.  She has a history of asthma, which she acknowledges as having an allergy component that might be contributing to her symptoms. She has not mentioned any recent asthma exacerbations.  She describes occasional gastrointestinal discomfort, particularly when swallowing large amounts of food without water, but notes improvement with current medication. She also mentions intermittent diarrhea, which she believes is related to her diet. She does not have a  gallbladder, which she thinks might be contributing to her symptoms.  She reports foot pain, particularly in the middle of her foot and around a bunion, which she suspects might be due to arthritis. She has tried various supportive shoes to alleviate the discomfort.  Her back and hips are feeling better, and she is engaging in stretches and starting to walk again as the weather improves. She had a bone density test in July, which was normal for her age, and she does not require medication for osteoporosis.  Her blood pressure is generally well-controlled at home, with readings around 120/70 to 128/80. She checks it daily and notes it is pretty good.       Assessment  1. Encounter for well adult exam with abnormal findings   2. Essential hypertension   3. GAD (generalized anxiety disorder)   4. Mixed hyperlipidemia   5. Spondylosis of cervical 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 myelopathy or radiculopathy   6. Mild intermittent asthma without complication   7. Primary insomnia   8. Bile salt-induced diarrhea      Plan  Female Wellness Visit: Age appropriate Health Maintenance and Prevention measures were discussed with patient. Included topics are cancer screening recommendations, ways to keep healthy (see AVS) including dietary and exercise recommendations, regular eye and dental care, use of seat belts, and avoidance of moderate alcohol use and tobacco use.  BMI: discussed patient's BMI and encouraged positive lifestyle modifications to help get to or maintain a target BMI. HM needs and immunizations were addressed and ordered. See below for orders. See HM and immunization section for updates. Routine labs  and screening tests ordered including cmp, cbc and lipids where appropriate. Discussed recommendations regarding Vit D and calcium supplementation (see AVS)  Chronic disease management visit and/or acute problem visit: Assessment and Plan    Allergic Rhinitis Chronic symptoms persist despite Flonase  and aloe vera. Singulair considered for allergy and asthma symptoms. Zyrtec may improve ocular symptoms. - Prescribe Singulair (montelukast) 10 mg once daily at night. - Consider switching to Zyrtec for improved ocular symptom control. - Follow up with ophthalmologist.  Foot Pain Pain in midfoot possibly due to arthritis or bunion, worsened by certain footwear. Metatarsal pad and supportive shoes may help. - Consider using a metatarsal pad. - Wear firm, supportive shoes.  Hypertension Home readings normal, office readings elevated due to situational factors. Current management effective. - Monitor blood pressure at home regularly. - Report significant increases.  HLD on statin: recheck levels today GERD and globus sensation stable Hip bursitis and back pain are improved.   General Health Maintenance Up to date with bone density screening and influenza vaccination. Discussed pneumococcal vaccination due to asthma risk. - Order blood work. - Consider RSV vaccination.     Follow up: 6 mo for HTN  Orders Placed This Encounter  Procedures   CBC with Differential/Platelet   Comprehensive metabolic panel   Lipid panel   TSH   Meds ordered this encounter  Medications   montelukast (SINGULAIR) 10 MG tablet    Sig: Take 1 tablet (10 mg total) by mouth at bedtime.    Dispense:  90 tablet    Refill:  3      Body mass index is 23.43 kg/m. 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 Chronic PPI Esophageal manometry scheduled for April 2024 pyrtle    Dyspareunia 03/13/2013     Priority: Medium    Basal cell carcinoma of skin 02/29/2012     Priority: Medium     basal cell carcinoma on her left melolabial fold approximately 30 years  ago    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     Priority: Low    By bx by gyn 2022    ACE-inhibitor cough 11/25/2020     Priority: Low   Chronic allergic rhinitis 09/04/2019  Priority: Low   Dry eye syndrome 03/29/2019     Priority: Low   Postmenopausal atrophic vaginitis 03/13/2013     Priority: Low   Globus sensation 10/19/2022    Health Maintenance  Topic Date Due   DEXA SCAN  06/10/2023   COVID-19 Vaccine (6 - 2024-25 season) 12/25/2023 (Originally 05/30/2023)   MAMMOGRAM  05/26/2024   Medicare Annual Wellness (AWV)  11/09/2024   Colonoscopy  10/26/2026   Pneumonia Vaccine 39+ Years old  Completed   INFLUENZA VACCINE  Completed   Hepatitis C Screening  Completed   Zoster Vaccines- Shingrix  Completed   HPV VACCINES  Aged Out   DTaP/Tdap/Td  Discontinued   Immunization History  Administered Date(s) Administered   Fluad Quad(high Dose 65+) 06/19/2019, 06/11/2020, 06/16/2021, 06/16/2022   Fluad Trivalent(High Dose 65+) 06/09/2023   Influenza Split 08/30/2012, 08/18/2013, 07/18/2014   Influenza, High Dose Seasonal PF 06/19/2019,  06/11/2020   Influenza, Seasonal, Injecte, Preservative Fre 07/16/2011   Influenza,inj,quad, With Preservative 07/01/2015, 06/30/2016, 06/28/2018   Moderna Covid-19 Vaccine Bivalent Booster 50yrs & up 07/24/2021   Moderna Sars-Covid-2 Vaccination 10/28/2019, 11/30/2019, 08/05/2020, 02/06/2021   Pneumococcal Conjugate-13 10/24/2014   Pneumococcal Polysaccharide-23 10/29/2015   Tdap 01/08/2011   Zoster Recombinant(Shingrix) 12/03/2020, 04/10/2021   We updated and reviewed the patient's past history in detail and it is documented below. Allergies: Patient is allergic to aspirin, nsaids, penicillins, yellow hornet venom [hornet venom], lisinopril, and clindamycin/lincomycin. Past Medical History Patient  has a past medical history of ACE-inhibitor cough (11/25/2020), Allergy, Arthritis, Basal cell carcinoma of skin (02/29/2012), Diverticulitis, Essential hypertension (03/29/2019), GERD (gastroesophageal reflux disease) (03/29/2019), Mild intermittent asthma without complication (03/29/2019), Postmenopausal atrophic vaginitis (03/13/2013), Primary insomnia (11/16/2017), and Seasonal allergic rhinitis due to pollen (03/29/2019). Past Surgical History Patient  has a past surgical history that includes Colonoscopy; Cholecystectomy; Nasal sinus surgery; Upper gastrointestinal endoscopy; and Thyroidectomy (Right, 12/08/2021). Family History: Patient family history includes Arthritis in her maternal grandmother; Cancer in her mother; Early death in her mother; Healthy in her son and son; Heart attack in her father; Heart disease in her father; High blood pressure in her brother; Lung cancer in her mother. Social History:  Patient  reports that she has never smoked. She has never used smokeless tobacco. She reports current alcohol use of about 5.0 standard drinks of alcohol per week. She reports that she does not use drugs.  Review of Systems: Constitutional: negative for fever or malaise Ophthalmic:  negative for photophobia, double vision or loss of vision Cardiovascular: negative for chest pain, dyspnea on exertion, or new LE swelling Respiratory: negative for SOB or persistent cough Gastrointestinal: negative for abdominal pain, change in bowel habits or melena Genitourinary: negative for dysuria or gross hematuria, no abnormal uterine bleeding or disharge Musculoskeletal: negative for new gait disturbance or muscular weakness Integumentary: negative for new or persistent rashes, no breast lumps Neurological: negative for TIA or stroke symptoms Psychiatric: negative for SI or delusions Allergic/Immunologic: negative for hives  Patient Care Team    Relationship Specialty Notifications Start End  Willow Ora, MD PCP - General Family Medicine  03/29/19   Jodelle Red, MD PCP - Cardiology Cardiology  08/10/22   Orma Flaming, MD Consulting Physician Obstetrics and Gynecology  03/29/19   Fuller Plan, Jolaine Click, FNP Consulting Physician Dermatology  08/07/19   Vivi Barrack, DPM Consulting Physician Podiatry  08/07/19   Pyrtle, Carie Caddy, MD Consulting Physician Gastroenterology  01/23/21   Serena Colonel, MD Consulting Physician Otolaryngology  11/28/21  Richardean Sale, DO Consulting Physician Sports Medicine  03/18/23     Objective  Vitals: BP 124/78 Comment: by consistent home readings  Pulse 68   Temp 97.7 F (36.5 C)   Ht 5\' 5"  (1.651 m)   Wt 140 lb 12.8 oz (63.9 kg)   SpO2 97%   BMI 23.43 kg/m  General:  Well developed, well nourished, no acute distress  Psych:  Alert and orientedx3,normal mood and affect HEENT:  Normocephalic, atraumatic, non-icteric sclera,  supple neck without adenopathy, mass or thyromegaly Cardiovascular:  Normal S1, S2, RRR without gallop, rub or murmur Respiratory:  Good breath sounds bilaterally, CTAB with normal respiratory effort Gastrointestinal: normal bowel sounds, soft, non-tender, no noted masses. No HSM MSK: extremities without  edema, joints without erythema or swelling Neurologic:    Mental status is normal.  Gross motor and sensory exams are normal.  No tremor  Commons side effects, risks, benefits, and alternatives for medications and treatment plan prescribed today were discussed, and the patient expressed understanding of the given instructions. Patient is instructed to call or message via MyChart if he/she has any questions or concerns regarding our treatment plan. No barriers to understanding were identified. We discussed Red Flag symptoms and signs in detail. Patient expressed understanding regarding what to do in case of urgent or emergency type symptoms.  Medication list was reconciled, printed and provided to the patient in AVS. Patient instructions and summary information was reviewed with the patient as documented in the AVS. This note was prepared with assistance of Dragon voice recognition software. Occasional wrong-word or sound-a-like substitutions may have occurred due to the inherent limitations of voice recognition software

## 2023-12-09 NOTE — Patient Instructions (Signed)
 Please return in 6 months for hypertension follow up.   I will release your lab results to you on your MyChart account with further instructions. You may see the results before I do, but when I review them I will send you a message with my report or have my assistant call you if things need to be discussed. Please reply to my message with any questions. Thank you!   If you have any questions or concerns, please don't hesitate to send me a message via MyChart or call the office at [phone]. Thank you for visiting with [place] today! It's our pleasure caring for you.   VISIT SUMMARY:  Today, we discussed your worsening allergy symptoms, foot pain, and general health maintenance. We reviewed your current medications and considered new options to help manage your symptoms more effectively. We also talked about your blood pressure and general health maintenance, including vaccinations and routine blood work.  YOUR PLAN:  -ALLERGIC RHINITIS: Allergic rhinitis is an inflammation of the inside of the nose caused by allergens, leading to symptoms like a runny nose, sneezing, and itchy eyes. We will start you on Singulair (montelukast) 10 mg once daily at night to help manage your allergy and asthma symptoms. Additionally, switching to Zyrtec may help improve your eye symptoms. Please follow up with your ophthalmologist as scheduled.  -FOOT PAIN: Your foot pain, particularly in the middle of your foot and around the bunion, may be due to arthritis or the bunion itself. Using a metatarsal pad and wearing firm, supportive shoes may help alleviate the discomfort.  -HYPERTENSION: Your blood pressure readings at home are generally well-controlled, but office readings are elevated due to situational factors. Continue to monitor your blood pressure at home regularly and report any significant increases.  -GENERAL HEALTH MAINTENANCE: You are up to date with your bone density screening and influenza vaccination. We  discussed the pneumococcal vaccination due to your asthma risk and will order routine blood work to monitor your overall health.  INSTRUCTIONS:  Please follow up with your ophthalmologist as scheduled. Continue to monitor your blood pressure at home and report any significant increases. We will order routine blood work and consider the pneumococcal vaccination at your next visit.

## 2023-12-10 ENCOUNTER — Other Ambulatory Visit: Payer: Self-pay

## 2023-12-10 DIAGNOSIS — E782 Mixed hyperlipidemia: Secondary | ICD-10-CM

## 2023-12-14 ENCOUNTER — Encounter: Payer: Self-pay | Admitting: Family Medicine

## 2023-12-14 MED ORDER — ROSUVASTATIN CALCIUM 10 MG PO TABS: 20.0000 mg | ORAL_TABLET | Freq: Every evening | ORAL | 3 refills | Status: DC

## 2023-12-14 NOTE — Progress Notes (Signed)
 Please call patient:cholesterol levels are very high again. Did she stop her crestor??I recommend restarting it. Other lab results are ok.

## 2023-12-15 DIAGNOSIS — H16223 Keratoconjunctivitis sicca, not specified as Sjogren's, bilateral: Secondary | ICD-10-CM | POA: Diagnosis not present

## 2023-12-22 ENCOUNTER — Ambulatory Visit (INDEPENDENT_AMBULATORY_CARE_PROVIDER_SITE_OTHER)

## 2023-12-22 ENCOUNTER — Encounter: Payer: Self-pay | Admitting: Podiatry

## 2023-12-22 ENCOUNTER — Ambulatory Visit: Admitting: Podiatry

## 2023-12-22 DIAGNOSIS — R52 Pain, unspecified: Secondary | ICD-10-CM | POA: Diagnosis not present

## 2023-12-22 DIAGNOSIS — M79671 Pain in right foot: Secondary | ICD-10-CM

## 2023-12-22 DIAGNOSIS — M7751 Other enthesopathy of right foot: Secondary | ICD-10-CM

## 2023-12-22 MED ORDER — METHYLPREDNISOLONE 4 MG PO TBPK: ORAL_TABLET | ORAL | 0 refills | Status: DC

## 2023-12-22 MED ORDER — BETAMETHASONE SOD PHOS & ACET 6 (3-3) MG/ML IJ SUSP
3.0000 mg | Freq: Once | INTRAMUSCULAR | Status: AC
Start: 2023-12-22 — End: 2023-12-22
  Administered 2023-12-22: 3 mg via INTRA_ARTICULAR

## 2023-12-22 NOTE — Progress Notes (Signed)
   Chief Complaint  Patient presents with   Foot Pain    Rm7: Right foot pain. Pt states pain between 2nd and 3rd toe has been occurring for 3 to 4 weeks, hurts more when walking and when flexing toes. At the worse the pain is a 10 today the pain is a 7. She does not remember any injury to the toes.     HPI: 73 y.o. female presenting today for acute onset of pain and tenderness associated to the right forefoot ongoing for about 3-4 weeks now.  No history of injury.  She says that today the pain is actually improved.  Past Medical History:  Diagnosis Date   ACE-inhibitor cough 11/25/2020   Allergy    seasonal   Arthritis    Basal cell carcinoma of skin 02/29/2012   basal cell carcinoma on her left melolabial fold approximately 30 years  ago   Diverticulitis    Essential hypertension 03/29/2019   GERD (gastroesophageal reflux disease) 03/29/2019   Chronic, normal EGD. 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   Primary insomnia 11/16/2017   Seasonal allergic rhinitis due to pollen 03/29/2019    Past Surgical History:  Procedure Laterality Date   CHOLECYSTECTOMY     COLONOSCOPY     x2   NASAL SINUS SURGERY     THYROIDECTOMY Right 12/08/2021   Procedure: THYROID LOBECTOMY;  Surgeon: Serena Colonel, MD;  Location: [facility];  Service: ENT;  Laterality: Right;   UPPER GASTROINTESTINAL ENDOSCOPY      Allergies  Allergen Reactions   Aspirin Other (See Comments)    Asthma   Nsaids Shortness Of Breath   Penicillins Diarrhea    Told as a child   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  Dermatology: Skin is warm, dry and supple bilateral lower extremities.   Vascular: Palpable pedal pulses bilaterally. Capillary refill within normal limits.  No  appreciable edema.  No erythema.  Neurological: Grossly intact via light touch  Musculoskeletal Exam: No pedal deformities noted.  Tenderness with palpation range of motion of the second MTP of the right foot  Radiographic Exam RT foot 12/22/2023:  Normal osseous mineralization. Joint spaces preserved.  No fractures or osseous irregularities noted.  Impression: Negative  Assessment/Plan of Care: 1.  Second MTP capsulitis right foot  -Patient evaluated.  X-rays reviewed -Injection of 0.5 cc Celestone Soluspan injected around the second MTP of the right foot -Prescription for Medrol Dosepak -No NSAIDs.  Patient allergic -Advised against going barefoot.  Recommend good supportive tennis shoes.  Patient currently wears Hoka's -Return to clinic as needed       Felecia Shelling, DPM [facility]  Dr. Felecia Shelling, DPM    [address] [place], [place] [address]                Office [phone]  Fax [phone]

## 2023-12-29 ENCOUNTER — Ambulatory Visit: Admitting: Podiatry

## 2024-01-19 ENCOUNTER — Other Ambulatory Visit: Payer: Self-pay | Admitting: Family Medicine

## 2024-02-15 ENCOUNTER — Encounter: Payer: Self-pay | Admitting: Family Medicine

## 2024-02-16 MED ORDER — HYDROCORTISONE ACETATE 25 MG RE SUPP: 25.0000 mg | Freq: Two times a day (BID) | RECTAL | 2 refills | Status: AC | PRN

## 2024-02-26 ENCOUNTER — Other Ambulatory Visit: Payer: Self-pay | Admitting: Family Medicine

## 2024-02-28 ENCOUNTER — Encounter: Payer: Self-pay | Admitting: Family Medicine

## 2024-02-28 NOTE — Telephone Encounter (Signed)
 09/02/2023 LOV  09/02/2023 fill date  30/5 refills

## 2024-03-26 ENCOUNTER — Encounter: Payer: Self-pay | Admitting: Family Medicine

## 2024-03-27 ENCOUNTER — Other Ambulatory Visit: Payer: Self-pay

## 2024-03-27 DIAGNOSIS — J309 Allergic rhinitis, unspecified: Secondary | ICD-10-CM

## 2024-03-27 MED ORDER — EPINEPHRINE 0.3 MG/0.3ML IJ SOAJ: 0.3000 mg | INTRAMUSCULAR | 1 refills | Status: AC | PRN

## 2024-04-03 ENCOUNTER — Ambulatory Visit: Admitting: Sports Medicine

## 2024-04-03 VITALS — BP 124/80 | HR 68 | Ht 65.0 in | Wt 144.0 lb

## 2024-04-03 DIAGNOSIS — G8929 Other chronic pain: Secondary | ICD-10-CM | POA: Diagnosis not present

## 2024-04-03 DIAGNOSIS — M545 Low back pain, unspecified: Secondary | ICD-10-CM | POA: Diagnosis not present

## 2024-04-03 MED ORDER — METHYLPREDNISOLONE 4 MG PO TBPK: ORAL_TABLET | ORAL | 0 refills | Status: DC

## 2024-04-03 NOTE — Progress Notes (Signed)
 Ben Renada Cronin D.[facility] [address] [place] [address] Phone: [phone]   Assessment and Plan:    1. Chronic right-sided low back pain without sciatica -Chronic with exacerbation, initial visit - Right sided low back pain and a flare x 3 days with history of intermittent back pain.  No MOI and no radicular symptoms.  Likely multifactorial with suspected lumbar degenerative changes, SI joint flare, localized lumbar muscular pain - Patient does not tolerate NSAIDs due to side effect of shortness of breath - Start prednisone  Dosepak - Start HEP focusing on low back  15 additional minutes spent for educating Therapeutic Home Exercise Program.  This included exercises focusing on stretching, strengthening, with focus on eccentric aspects.   Long term goals include an improvement in range of motion, strength, endurance as well as avoiding reinjury. Patient's frequency would include in 1-2 times a day, 3-5 times a week for a duration of 6-12 weeks. Proper technique shown and discussed handout in great detail with ATC.  All questions were discussed and answered.     Pertinent previous records reviewed include none  Follow Up: 2 to 3 weeks for reevaluation.  If no improvement or worsening of symptoms, would obtain lumbar x-ray and could consider physical therapy versus SI joint injection   Subjective:    Chief Complaint: back spasm  HPI:   04/03/24 Patient states that her back was doing a lot better but on Monday she has been having an issue. Just woke up with the pain and did not do anything to it. Can't take aleve or anything like because she is allergic to it. Using topicals but it doesn't help that much. Having trouble with day to day activities. Pain can wake her up at night when she moves. Breathing in hard, sneezing, bending over are issues.   Relevant Historical Information: Hypertension, GERD  Additional pertinent review of systems  negative.   Current Outpatient Medications:    acetaminophen  (TYLENOL ) 500 MG tablet, Take 500 mg by mouth every 6 (six) hours as needed for moderate pain., Disp: , Rfl:    albuterol  (VENTOLIN  HFA) 108 (90 Base) MCG/ACT inhaler, INHALE 2 PUFFS INTO THE LUNGS EVERY 6 HOURS AS NEEDED FOR WHEEZING OR SHORTNESS OF BREATH, Disp: 18 g, Rfl: 1   buPROPion  (WELLBUTRIN  SR) 150 MG 12 hr tablet, TAKE 1 TABLET BY MOUTH TWICE A DAY, Disp: 180 tablet, Rfl: 3   cholestyramine  (QUESTRAN ) 4 g packet, Take 0.5 packets (2 g total) by mouth 2 (two) times daily as needed., Disp: 60 each, Rfl: 11   clobetasol ointment (TEMOVATE) 0.05 %, Apply 1 Application topically 2 (two) times daily., Disp: , Rfl:    EPINEPHrine  (EPIPEN  2-PAK) 0.3 mg/0.3 mL IJ SOAJ injection, Inject 0.3 mg into the muscle as needed for anaphylaxis. Use as instructed., Disp: 1 each, Rfl: 1   hydrocortisone  (ANUSOL -HC) 25 MG suppository, Place 1 suppository (25 mg total) rectally 2 (two) times daily as needed for hemorrhoids or anal itching., Disp: 12 suppository, Rfl: 2   Lactobacillus Rhamnosus, GG, (CULTURELLE) CAPS, Take 1 capsule by mouth daily., Disp: , Rfl:    losartan  (COZAAR ) 50 MG tablet, TAKE ONE HALF TABLET (25MG ) BY MOUTH EVERY MORNING AND TAKE ONE HALF TABLET (25MG ) BY MOUTH EVERY NIGHT AT BEDTIME, Disp: 90 tablet, Rfl: 3   methylPREDNISolone  (MEDROL  DOSEPAK) 4 MG TBPK tablet, Follow instructions on package., Disp: 21 tablet, Rfl: 0   metoprolol  succinate (TOPROL -XL) 50 MG 24 hr tablet, TAKE  1 TABLET BY MOUTH DAILY WITH OR IMMEDIATELY FOLLOWING A MEAL., Disp: 90 tablet, Rfl: 3   pantoprazole  (PROTONIX ) 40 MG tablet, TAKE 1 TABLET BY MOUTH 2 TIMES A DAY, Disp: 180 tablet, Rfl: 3   Polyvinyl Alcohol -Povidone (REFRESH OP), Place 1 drop into both eyes daily as needed (dry eyes)., Disp: , Rfl:    RESTASIS 0.05 % ophthalmic emulsion, , Disp: , Rfl:    rosuvastatin  (CRESTOR ) 10 MG tablet, Take 2 tablets (20 mg total) by mouth at bedtime., Disp:  180 tablet, Rfl: 3   sodium chloride  (OCEAN) 0.65 % SOLN nasal spray, Place 1 spray into both nostrils as needed for congestion., Disp: , Rfl:    zolpidem  (AMBIEN ) 10 MG tablet, TAKE 1 TABLET BY MOUTH EVERY NIGHT AT BEDTIME AS NEEDED FOR SLEEP, Disp: 30 tablet, Rfl: 5   methylPREDNISolone  (MEDROL  DOSEPAK) 4 MG TBPK tablet, 6 day dose pack - take as directed, Disp: 21 tablet, Rfl: 0   montelukast  (SINGULAIR ) 10 MG tablet, Take 1 tablet (10 mg total) by mouth at bedtime., Disp: 90 tablet, Rfl: 3   Objective:     Vitals:   04/03/24 0826  BP: 124/80  Pulse: 68  SpO2: 98%  Weight: 144 lb (65.3 kg)  Height: 5' 5 (1.651 m)      Body mass index is 23.96 kg/m.    Physical Exam:    Gen: Appears well, nad, nontoxic and pleasant Psych: Alert and oriented, appropriate mood and affect Neuro: sensation intact, strength is 5/5 in upper and lower extremities, muscle tone wnl Skin: no susupicious lesions or rashes  Back - Normal skin, Spine with normal alignment and no deformity.   L3-L5 tenderness to vertebral process palpation.   Right paraspinous muscles are   tender and without spasm NTTP gluteal musculature Straight leg raise negative Trendelenberg positive right Piriformis Test negative Gait normal  TTP moderate right sacral base, mild left sacral base  Electronically signed by:  Odis Mace D.[facility] 8:46 AM 04/03/24

## 2024-04-03 NOTE — Patient Instructions (Addendum)
 Prednisone  dose pack. Low back HEP. Follow up in 2 to 3 weeks.

## 2024-04-07 DIAGNOSIS — H02882 Meibomian gland dysfunction right lower eyelid: Secondary | ICD-10-CM | POA: Diagnosis not present

## 2024-04-07 DIAGNOSIS — H02885 Meibomian gland dysfunction left lower eyelid: Secondary | ICD-10-CM | POA: Diagnosis not present

## 2024-04-07 DIAGNOSIS — H2513 Age-related nuclear cataract, bilateral: Secondary | ICD-10-CM | POA: Diagnosis not present

## 2024-04-12 ENCOUNTER — Ambulatory Visit: Admitting: Podiatry

## 2024-04-12 ENCOUNTER — Encounter: Payer: Self-pay | Admitting: Podiatry

## 2024-04-12 VITALS — Ht 65.0 in | Wt 144.0 lb

## 2024-04-12 DIAGNOSIS — L910 Hypertrophic scar: Secondary | ICD-10-CM | POA: Diagnosis not present

## 2024-04-12 NOTE — Progress Notes (Signed)
   Chief Complaint  Patient presents with   Foot Pain    Pt is here due to pain at the bottom of her right she states 70 years ago she jump on an item and injured her foot had to have stitches, has not had any problems that she can remember since that incident, but in the last few weeks the incision has been bothering, states the area is very tender to touch.    HPI: 73 y.o. female presenting today for above complaint.  She has not anything recently for treatment.  She states that she has increased walking on a daily basis but she wears good supportive [place] tennis shoes  Past Medical History:  Diagnosis Date   ACE-inhibitor cough 11/25/2020   Allergy    seasonal   Arthritis    Basal cell carcinoma of skin 02/29/2012   basal cell carcinoma on her left melolabial fold approximately 30 years  ago   Diverticulitis    Essential hypertension 03/29/2019   GERD (gastroesophageal reflux disease) 03/29/2019   Chronic, normal EGD. 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   Primary insomnia 11/16/2017   Seasonal allergic rhinitis due to pollen 03/29/2019    Past Surgical History:  Procedure Laterality Date   CHOLECYSTECTOMY     COLONOSCOPY     x2   NASAL SINUS SURGERY     THYROIDECTOMY Right 12/08/2021   Procedure: THYROID  LOBECTOMY;  Surgeon: Jesus Oliphant, MD;  Location: [facility] OR;  Service: ENT;  Laterality: Right;   UPPER GASTROINTESTINAL ENDOSCOPY      Allergies  Allergen Reactions   Aspirin Other (See Comments)    Asthma   Nsaids Shortness Of Breath   Penicillins Diarrhea    Told as a child   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  Dermatology: Skin is warm, dry and supple bilateral lower extremities.  No open wounds.  There is a scar along the  plantar arch of the right foot.  At the most proximal portion of the scar there is some hyperkeratotic callus tissue which is the area of sensitivity  Vascular: Palpable pedal pulses bilaterally. Capillary refill within normal limits.  No appreciable edema.  No erythema.  Neurological: Grossly intact via light touch  Musculoskeletal Exam: No pedal deformities noted.  Muscle strength 5/5 all compartments   Assessment/Plan of Care: 1.  Hyperkeratotic symptomatic scar plantar arch of the right foot  -Light debridement of the scar was performed today using a tissue nipper and smoothed with a rotary bur without incident or bleeding.  Patient felt significant relief -Continue good supportive tennis shoes and advised against going barefoot -Recommend daily foot lotion to keep the scar tissue soft and supple -Return to clinic as needed     Thresa EMERSON Sar, DPM [facility]  Dr. Thresa EMERSON Sar, DPM    [address] [place], [place] [address]                Office [phone]  Fax [phone]

## 2024-04-14 ENCOUNTER — Other Ambulatory Visit: Payer: Self-pay | Admitting: Family Medicine

## 2024-04-14 DIAGNOSIS — E782 Mixed hyperlipidemia: Secondary | ICD-10-CM

## 2024-04-18 DIAGNOSIS — K219 Gastro-esophageal reflux disease without esophagitis: Secondary | ICD-10-CM | POA: Diagnosis not present

## 2024-04-18 DIAGNOSIS — J309 Allergic rhinitis, unspecified: Secondary | ICD-10-CM | POA: Diagnosis not present

## 2024-04-19 NOTE — Progress Notes (Deleted)
 Ben Jackson D.CLEMENTEEN [facility] [address] [place] [address] Phone: [phone]   Assessment and Plan:     There are no diagnoses linked to this encounter.  ***   Pertinent previous records reviewed include ***    Follow Up: ***     Subjective:   I, Tina Kelley, am serving as a Neurosurgeon for Doctor Morene Mace  Chief Complaint: back spasm   HPI:    04/03/24 Patient states that her back was doing a lot better but on Monday she has been having an issue. Just woke up with the pain and did not do anything to it. Can't take aleve or anything like because she is allergic to it. Using topicals but it doesn't help that much. Having trouble with day to day activities. Pain can wake her up at night when she moves. Breathing in hard, sneezing, bending over are issues.   04/20/2024 Patient states   Relevant Historical Information: Hypertension, GERD  Additional pertinent review of systems negative.   Current Outpatient Medications:    acetaminophen  (TYLENOL ) 500 MG tablet, Take 500 mg by mouth every 6 (six) hours as needed for moderate pain., Disp: , Rfl:    albuterol  (VENTOLIN  HFA) 108 (90 Base) MCG/ACT inhaler, INHALE 2 PUFFS INTO THE LUNGS EVERY 6 HOURS AS NEEDED FOR WHEEZING OR SHORTNESS OF BREATH, Disp: 18 g, Rfl: 1   buPROPion  (WELLBUTRIN  SR) 150 MG 12 hr tablet, TAKE 1 TABLET BY MOUTH TWICE A DAY, Disp: 180 tablet, Rfl: 3   cholestyramine  (QUESTRAN ) 4 g packet, Take 0.5 packets (2 g total) by mouth 2 (two) times daily as needed., Disp: 60 each, Rfl: 11   clobetasol ointment (TEMOVATE) 0.05 %, Apply 1 Application topically 2 (two) times daily., Disp: , Rfl:    EPINEPHrine  (EPIPEN  2-PAK) 0.3 mg/0.3 mL IJ SOAJ injection, Inject 0.3 mg into the muscle as needed for anaphylaxis. Use as instructed., Disp: 1 each, Rfl: 1   hydrocortisone  (ANUSOL -HC) 25 MG suppository, Place 1 suppository (25 mg total) rectally 2 (two) times daily as needed  for hemorrhoids or anal itching., Disp: 12 suppository, Rfl: 2   Lactobacillus Rhamnosus, GG, (CULTURELLE) CAPS, Take 1 capsule by mouth daily., Disp: , Rfl:    losartan  (COZAAR ) 50 MG tablet, TAKE ONE HALF TABLET (25MG ) BY MOUTH EVERY MORNING AND TAKE ONE HALF TABLET (25MG ) BY MOUTH EVERY NIGHT AT BEDTIME, Disp: 90 tablet, Rfl: 3   methylPREDNISolone  (MEDROL  DOSEPAK) 4 MG TBPK tablet, Follow instructions on package., Disp: 21 tablet, Rfl: 0   metoprolol  succinate (TOPROL -XL) 50 MG 24 hr tablet, TAKE 1 TABLET BY MOUTH DAILY WITH OR IMMEDIATELY FOLLOWING A MEAL., Disp: 90 tablet, Rfl: 3   montelukast  (SINGULAIR ) 10 MG tablet, Take 1 tablet (10 mg total) by mouth at bedtime., Disp: 90 tablet, Rfl: 3   pantoprazole  (PROTONIX ) 40 MG tablet, TAKE 1 TABLET BY MOUTH 2 TIMES A DAY, Disp: 180 tablet, Rfl: 3   Polyvinyl Alcohol -Povidone (REFRESH OP), Place 1 drop into both eyes daily as needed (dry eyes)., Disp: , Rfl:    RESTASIS 0.05 % ophthalmic emulsion, , Disp: , Rfl:    rosuvastatin  (CRESTOR ) 10 MG tablet, TAKE 2 TABLETS BY MOUTH DAILY, Disp: 180 tablet, Rfl: 3   sodium chloride  (OCEAN) 0.65 % SOLN nasal spray, Place 1 spray into both nostrils as needed for congestion., Disp: , Rfl:    zolpidem  (AMBIEN ) 10 MG tablet, TAKE 1 TABLET BY MOUTH EVERY NIGHT AT BEDTIME AS NEEDED  FOR SLEEP, Disp: 30 tablet, Rfl: 5   Objective:     There were no vitals filed for this visit.    There is no height or weight on file to calculate BMI.    Physical Exam:    ***   Electronically signed by:  Odis Mace D.[facility] 7:33 AM 04/19/24

## 2024-04-20 ENCOUNTER — Ambulatory Visit: Admitting: Sports Medicine

## 2024-04-23 ENCOUNTER — Other Ambulatory Visit: Payer: Self-pay | Admitting: Internal Medicine

## 2024-05-01 DIAGNOSIS — H02885 Meibomian gland dysfunction left lower eyelid: Secondary | ICD-10-CM | POA: Diagnosis not present

## 2024-05-24 NOTE — Progress Notes (Unsigned)
 Tina Kelley [facility] [address] [place] [address] Phone: [phone]   Assessment and Plan:     1. Chronic right-sided low back pain without sciatica (Primary) -Chronic with exacerbation, subsequent visit - Recurrence of low back pain, primarily right-sided likely flared due to physical activity taking care of her grandchildren and helping with moving.  Suspect multifactorial cause including suspected lumbar degenerative changes, SI joint flare, localized lumbar muscular pain - Patient had significant relief in low back pain with prednisone  course after office visit 04/03/2024.  Will repeat prednisone  Dosepak -Will obtain x-ray at today's visit and can review at follow-up visit  2. Acute pain of left shoulder -Acute, initial visit - Suspect compensatory pain leading to rotator cuff strain with chronic flare of upper back and neck pain - Start prednisone  Dosepak - Will obtain x-ray and can review at follow-up   Pertinent previous records reviewed include none   Follow Up: 2 to 3 weeks for reevaluation.  Would review x-ray imaging.  Could consider physical therapy versus CSI   Subjective:   I, Tina Kelley, am serving as a Neurosurgeon for Doctor Morene Mace  Chief Complaint: back spasm   HPI:    04/03/24 Patient states that her back was doing a lot better but on Monday she has been having an issue. Just woke up with the pain and did not do anything to it. Can't take aleve or anything like because she is allergic to it. Using topicals but it doesn't help that much. Having trouble with day to day activities. Pain can wake her up at night when she moves. Breathing in hard, sneezing, bending over are issues.   05/25/2024 Patient states she is in flare low back and shoulder from watching her grandchildren in [place]    Relevant Historical Information: Hypertension, GERD  Additional pertinent review of systems  negative.   Current Outpatient Medications:    methylPREDNISolone  (MEDROL  DOSEPAK) 4 MG TBPK tablet, Take 6 tablets on day 1.  Take 5 tablets on day 2.  Take 4 tablets on day 3.  Take 3 tablets on day 4.  Take 2 tablets on day 5.  Take 1 tablet on day 6., Disp: 21 tablet, Rfl: 0   acetaminophen  (TYLENOL ) 500 MG tablet, Take 500 mg by mouth every 6 (six) hours as needed for moderate pain., Disp: , Rfl:    albuterol  (VENTOLIN  HFA) 108 (90 Base) MCG/ACT inhaler, INHALE 2 PUFFS INTO THE LUNGS EVERY 6 HOURS AS NEEDED FOR WHEEZING OR SHORTNESS OF BREATH, Disp: 18 g, Rfl: 1   buPROPion  (WELLBUTRIN  SR) 150 MG 12 hr tablet, TAKE 1 TABLET BY MOUTH TWICE A DAY, Disp: 180 tablet, Rfl: 3   cholestyramine  (QUESTRAN ) 4 g packet, Take 0.5 packets (2 g total) by mouth 2 (two) times daily as needed., Disp: 60 each, Rfl: 11   clobetasol ointment (TEMOVATE) 0.05 %, Apply 1 Application topically 2 (two) times daily., Disp: , Rfl:    EPINEPHrine  (EPIPEN  2-PAK) 0.3 mg/0.3 mL IJ SOAJ injection, Inject 0.3 mg into the muscle as needed for anaphylaxis. Use as instructed., Disp: 1 each, Rfl: 1   hydrocortisone  (ANUSOL -HC) 25 MG suppository, Place 1 suppository (25 mg total) rectally 2 (two) times daily as needed for hemorrhoids or anal itching., Disp: 12 suppository, Rfl: 2   Lactobacillus Rhamnosus, GG, (CULTURELLE) CAPS, Take 1 capsule by mouth daily., Disp: , Rfl:    losartan  (COZAAR ) 50 MG tablet, TAKE ONE HALF TABLET (25MG ) BY  MOUTH EVERY MORNING AND TAKE ONE HALF TABLET (25MG ) BY MOUTH EVERY NIGHT AT BEDTIME, Disp: 90 tablet, Rfl: 3   methylPREDNISolone  (MEDROL  DOSEPAK) 4 MG TBPK tablet, Follow instructions on package., Disp: 21 tablet, Rfl: 0   metoprolol  succinate (TOPROL -XL) 50 MG 24 hr tablet, TAKE 1 TABLET BY MOUTH DAILY WITH OR IMMEDIATELY FOLLOWING A MEAL., Disp: 90 tablet, Rfl: 3   montelukast  (SINGULAIR ) 10 MG tablet, Take 1 tablet (10 mg total) by mouth at bedtime., Disp: 90 tablet, Rfl: 3   pantoprazole   (PROTONIX ) 40 MG tablet, TAKE 1 TABLET BY MOUTH 2 TIMES A DAY, Disp: 180 tablet, Rfl: 1   Polyvinyl Alcohol -Povidone (REFRESH OP), Place 1 drop into both eyes daily as needed (dry eyes)., Disp: , Rfl:    RESTASIS 0.05 % ophthalmic emulsion, , Disp: , Rfl:    rosuvastatin  (CRESTOR ) 10 MG tablet, TAKE 2 TABLETS BY MOUTH DAILY, Disp: 180 tablet, Rfl: 3   sodium chloride  (OCEAN) 0.65 % SOLN nasal spray, Place 1 spray into both nostrils as needed for congestion., Disp: , Rfl:    zolpidem  (AMBIEN ) 10 MG tablet, TAKE 1 TABLET BY MOUTH EVERY NIGHT AT BEDTIME AS NEEDED FOR SLEEP, Disp: 30 tablet, Rfl: 5   Objective:     Vitals:   05/25/24 1407  Pulse: 74  SpO2: 98%  Weight: 143 lb (64.9 kg)  Height: 5' 5 (1.651 m)      Body mass index is 23.8 kg/m.    Physical Exam:    Gen: Appears well, nad, nontoxic and pleasant Psych: Alert and oriented, appropriate mood and affect Neuro: sensation intact, strength is 5/5 in upper and lower extremities, muscle tone wnl Skin: no susupicious lesions or rashes   Back - Normal skin, Spine with normal alignment and no deformity.   L3-L5 tenderness to vertebral process palpation.   Right paraspinous muscles are   tender and without spasm NTTP gluteal musculature Straight leg raise negative Trendelenberg positive right Piriformis Test negative Gait normal  TTP moderate right sacral base, mild left sacral base  Left shoulder: Full ROM with pain at end range of flexion and abduction  Electronically signed by:  Odis Mace [facility] 2:18 PM 05/25/24

## 2024-05-25 ENCOUNTER — Ambulatory Visit: Admitting: Sports Medicine

## 2024-05-25 ENCOUNTER — Ambulatory Visit (INDEPENDENT_AMBULATORY_CARE_PROVIDER_SITE_OTHER)

## 2024-05-25 VITALS — HR 74 | Ht 65.0 in | Wt 143.0 lb

## 2024-05-25 DIAGNOSIS — M47816 Spondylosis without myelopathy or radiculopathy, lumbar region: Secondary | ICD-10-CM | POA: Diagnosis not present

## 2024-05-25 DIAGNOSIS — M25512 Pain in left shoulder: Secondary | ICD-10-CM

## 2024-05-25 DIAGNOSIS — M545 Low back pain, unspecified: Secondary | ICD-10-CM | POA: Diagnosis not present

## 2024-05-25 DIAGNOSIS — G8929 Other chronic pain: Secondary | ICD-10-CM

## 2024-05-25 MED ORDER — METHYLPREDNISOLONE 4 MG PO TBPK: ORAL_TABLET | ORAL | 0 refills | Status: DC

## 2024-05-25 NOTE — Patient Instructions (Signed)
 Xrays on the way out   Prednisone  dos pak   Continue HEP   2-3 week follow up

## 2024-05-30 DIAGNOSIS — Z1231 Encounter for screening mammogram for malignant neoplasm of breast: Secondary | ICD-10-CM | POA: Diagnosis not present

## 2024-05-30 DIAGNOSIS — R92323 Mammographic fibroglandular density, bilateral breasts: Secondary | ICD-10-CM | POA: Diagnosis not present

## 2024-05-30 LAB — HM MAMMOGRAPHY

## 2024-06-01 ENCOUNTER — Encounter: Payer: Self-pay | Admitting: Family Medicine

## 2024-06-01 ENCOUNTER — Ambulatory Visit: Admitting: Sports Medicine

## 2024-06-06 ENCOUNTER — Ambulatory Visit: Payer: Self-pay | Admitting: Sports Medicine

## 2024-06-13 ENCOUNTER — Ambulatory Visit: Admitting: Family Medicine

## 2024-06-13 ENCOUNTER — Encounter: Payer: Self-pay | Admitting: Family Medicine

## 2024-06-13 VITALS — BP 122/76 | HR 67 | Temp 97.7°F | Ht 65.0 in | Wt 140.8 lb

## 2024-06-13 DIAGNOSIS — Z8249 Family history of ischemic heart disease and other diseases of the circulatory system: Secondary | ICD-10-CM | POA: Diagnosis not present

## 2024-06-13 DIAGNOSIS — E782 Mixed hyperlipidemia: Secondary | ICD-10-CM

## 2024-06-13 DIAGNOSIS — Z23 Encounter for immunization: Secondary | ICD-10-CM

## 2024-06-13 DIAGNOSIS — I10 Essential (primary) hypertension: Secondary | ICD-10-CM

## 2024-06-13 LAB — COMPREHENSIVE METABOLIC PANEL WITH GFR
ALT: 21 U/L (ref 0–35)
AST: 30 U/L (ref 0–37)
Albumin: 4.2 g/dL (ref 3.5–5.2)
Alkaline Phosphatase: 75 U/L (ref 39–117)
BUN: 13 mg/dL (ref 6–23)
CO2: 27 meq/L (ref 19–32)
Calcium: 9.5 mg/dL (ref 8.4–10.5)
Chloride: 106 meq/L (ref 96–112)
Creatinine, Ser: 1.14 mg/dL (ref 0.40–1.20)
GFR: 47.37 mL/min — ABNORMAL LOW (ref >60.00)
Glucose, Bld: 105 mg/dL — ABNORMAL HIGH (ref 70–99)
Potassium: 4.5 meq/L (ref 3.5–5.1)
Sodium: 136 meq/L (ref 135–145)
Total Bilirubin: 0.7 mg/dL (ref 0.2–1.2)
Total Protein: 6.8 g/dL (ref 6.0–8.3)

## 2024-06-13 LAB — LIPID PANEL
Cholesterol: 206 mg/dL — ABNORMAL HIGH (ref 0–200)
HDL: 70 mg/dL (ref >39.00)
LDL Cholesterol: 104 mg/dL — ABNORMAL HIGH (ref 0–99)
NonHDL: 135.82
Total CHOL/HDL Ratio: 3
Triglycerides: 160 mg/dL — ABNORMAL HIGH (ref 0.0–149.0)
VLDL: 32 mg/dL (ref 0.0–40.0)

## 2024-06-13 NOTE — Progress Notes (Signed)
 Subjective  CC:  Chief Complaint  Patient presents with   Hypertension    HPI: Tina Kelley is a 73 y.o. female who presents to the office today to address the problems listed above in the chief complaint. Hypertension f/u: Control is good . Pt reports she is doing well. taking medications as instructed, no medication side effects noted, no TIAs, no chest pain on exertion, no dyspnea on exertion, no swelling of ankles. No concerns. She denies adverse effects from his BP medications. Compliance with medication is good.  HLD on crestor  20 now. Due for recheck. Ldl was high in march when last checked. Tolerates statin if she takes 20 every other day. Goal LDL < 100 Due flu shot.   Assessment  1. Essential hypertension   2. Need for influenza vaccination   3. Mixed hyperlipidemia   4. Family history of premature CAD      Plan   Hypertension f/u: BP control is well controlled. No med changes Check lipids and lfts to see if at goal on crestor  alternating 10 and 20.  Flu shot updated.  Education regarding management of these chronic disease states was given. Management strategies discussed on successive visits include dietary and exercise recommendations, goals of achieving and maintaining IBW, and lifestyle modifications aiming for adequate sleep and minimizing stressors.   Follow up: 6 mo for cpe  Orders Placed This Encounter  Procedures   Flu vaccine HIGH DOSE PF(Fluzone Trivalent)   Lipid panel   Comprehensive metabolic panel with GFR   No orders of the defined types were placed in this encounter.     BP Readings from Last 3 Encounters:  06/13/24 122/76  04/03/24 124/80  12/09/23 124/78   Wt Readings from Last 3 Encounters:  06/13/24 140 lb 12.8 oz (63.9 kg)  05/25/24 143 lb (64.9 kg)  04/12/24 144 lb (65.3 kg)    Lab Results  Component Value Date   CHOL 206 (H) 06/13/2024   CHOL 268 (H) 12/09/2023   CHOL 184 06/09/2023   Lab Results  Component Value Date    HDL 70.00 06/13/2024   HDL 64.90 12/09/2023   HDL 76.30 06/09/2023   Lab Results  Component Value Date   LDLCALC 104 (H) 06/13/2024   LDLCALC 161 (H) 12/09/2023   LDLCALC 82 06/09/2023   Lab Results  Component Value Date   TRIG 160.0 (H) 06/13/2024   TRIG 213.0 (H) 12/09/2023   TRIG 125.0 06/09/2023   Lab Results  Component Value Date   CHOLHDL 3 06/13/2024   CHOLHDL 4 12/09/2023   CHOLHDL 2 06/09/2023   Lab Results  Component Value Date   LDLDIRECT 108.0 12/02/2022   LDLDIRECT 82.0 11/25/2020   Lab Results  Component Value Date   CREATININE 1.14 06/13/2024   BUN 13 06/13/2024   NA 136 06/13/2024   K 4.5 06/13/2024   CL 106 06/13/2024   CO2 27 06/13/2024    The 10-year ASCVD risk score (Arnett DK, et al., 2019) is: 17.4%   Values used to calculate the score:     Age: 63 years     Clincally relevant sex: Female     Is Non-Hispanic African American: No     Diabetic: No     Tobacco smoker: No     Systolic Blood Pressure: 122 mmHg     Is BP treated: Yes     HDL Cholesterol: 70 mg/dL     Total Cholesterol: 206 mg/dL  I reviewed the patients updated PMH,  FH, and SocHx.    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    Priority: Medium    Primary localized osteoarthrosis of multiple sites 09/04/2019    Priority: Medium    Mild intermittent asthma without complication 03/29/2019    Priority: Medium    Gastroesophageal reflux disease 10/29/2015    Priority: Medium     Dyspareunia 03/13/2013    Priority: Medium    Basal cell carcinoma of skin 02/29/2012    Priority: Medium    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 She has never used smokeless tobacco. She reports current alcohol  use of about 5.0 standard drinks of alcohol  per week. She reports that she does not use drugs.  Current Meds  Medication Sig   acetaminophen  (TYLENOL ) 500 MG tablet Take 500 mg by mouth every 6 (six) hours as needed for moderate pain.   albuterol  (VENTOLIN  HFA) 108 (90 Base) MCG/ACT inhaler INHALE 2 PUFFS INTO THE LUNGS EVERY 6 HOURS AS NEEDED FOR WHEEZING OR SHORTNESS OF BREATH   buPROPion  (WELLBUTRIN  SR) 150 MG 12 hr tablet TAKE 1 TABLET BY MOUTH TWICE A DAY   cholestyramine  (QUESTRAN ) 4 g packet Take 0.5 packets (2 g total) by mouth 2 (two) times daily as needed.   clobetasol ointment (TEMOVATE) 0.05 % Apply 1 Application topically 2 (two) times daily.   EPINEPHrine  (EPIPEN  2-PAK) 0.3 mg/0.3 mL IJ SOAJ injection Inject 0.3 mg into the muscle as needed for anaphylaxis. Use as instructed.   hydrocortisone  (ANUSOL -HC) 25 MG suppository Place 1 suppository (25 mg total) rectally 2 (two) times daily as needed for hemorrhoids or anal itching.   Lactobacillus Rhamnosus, GG, (CULTURELLE) CAPS Take 1 capsule by mouth daily.   losartan  (COZAAR ) 50 MG tablet TAKE ONE HALF TABLET (25MG ) BY MOUTH EVERY MORNING AND TAKE ONE HALF TABLET (25MG ) BY MOUTH EVERY NIGHT AT BEDTIME   methylPREDNISolone  (MEDROL  DOSEPAK) 4 MG TBPK tablet  Follow instructions on package.   methylPREDNISolone  (MEDROL  DOSEPAK) 4 MG TBPK tablet Take 6 tablets on day 1.  Take 5 tablets on day 2.  Take 4 tablets on day 3.  Take 3 tablets on day 4.  Take 2 tablets on day 5.  Take 1 tablet on day 6.   metoprolol  succinate (TOPROL -XL) 50 MG 24 hr tablet TAKE 1 TABLET BY MOUTH DAILY WITH OR IMMEDIATELY FOLLOWING A MEAL.   pantoprazole  (PROTONIX ) 40 MG tablet TAKE 1 TABLET BY MOUTH 2 TIMES A DAY   Polyvinyl Alcohol -Povidone (REFRESH  OP) Place 1 drop into both eyes daily as needed (dry eyes).   RESTASIS 0.05 % ophthalmic emulsion    rosuvastatin  (CRESTOR ) 10 MG tablet TAKE 2 TABLETS BY MOUTH DAILY   sodium chloride  (OCEAN) 0.65 % SOLN nasal spray Place 1 spray into both nostrils as needed for congestion.   zolpidem  (AMBIEN ) 10 MG tablet TAKE 1 TABLET BY MOUTH EVERY NIGHT AT BEDTIME AS NEEDED FOR SLEEP    Review of Systems: Cardiovascular: negative for chest pain, palpitations, leg swelling, orthopnea Respiratory: negative for SOB, wheezing or persistent cough Gastrointestinal: negative for abdominal pain Genitourinary: negative for dysuria or gross hematuria  Objective  Vitals: BP 122/76 Comment: by consistent home readings  Pulse 67   Temp 97.7 F (36.5 C)   Ht 5' 5 (1.651 m)   Wt 140 lb 12.8 oz (63.9 kg)   SpO2 97%   BMI 23.43 kg/m  General: no acute distress  Psych:  Alert and oriented, normal mood and affect HEENT:  Normocephalic, atraumatic, supple neck  Cardiovascular:  RRR without murmur. no edema Respiratory:  Good breath sounds bilaterally, CTAB with normal respiratory effort Skin:  Warm, no rashes Neurologic:   Mental status is normal Commons side effects, risks, benefits, and alternatives for medications and treatment plan prescribed today were discussed, and the patient expressed understanding of the given instructions. Patient is instructed to call or message via MyChart if he/she has any questions or concerns regarding our  treatment plan. No barriers to understanding were identified. We discussed Red Flag symptoms and signs in detail. Patient expressed understanding regarding what to do in case of urgent or emergency type symptoms.  Medication list was reconciled, printed and provided to the patient in AVS. Patient instructions and summary information was reviewed with the patient as documented in the AVS. This note was prepared with assistance of Dragon voice recognition software. Occasional wrong-word or sound-a-like substitutions may have occurred due to the inherent limitation

## 2024-06-13 NOTE — Patient Instructions (Signed)
 Please return in 6 months for your annual complete physical; please come fasting.  For follow up on chronic medical conditions   I will release your lab results to you on your MyChart account with further instructions. You may see the results before I do, but when I review them I will send you a message with my report or have my assistant call you if things need to be discussed. Please reply to my message with any questions. Thank you!   If you have any questions or concerns, please don't hesitate to send me a message via MyChart or call the office at [phone]. Thank you for visiting with us  today! It's our pleasure caring for you.

## 2024-06-15 ENCOUNTER — Ambulatory Visit: Admitting: Sports Medicine

## 2024-06-17 ENCOUNTER — Ambulatory Visit: Payer: Self-pay | Admitting: Family Medicine

## 2024-06-17 NOTE — Progress Notes (Signed)
 See mychart note Dear Ms. Fairburn, Your labs look good. Your cholesterol is much improved. Please keep taking your medications as you are. See you next week :) Sincerely, Dr. Jodie

## 2024-07-03 ENCOUNTER — Ambulatory Visit: Admitting: Podiatry

## 2024-07-03 ENCOUNTER — Ambulatory Visit (INDEPENDENT_AMBULATORY_CARE_PROVIDER_SITE_OTHER)

## 2024-07-03 DIAGNOSIS — M722 Plantar fascial fibromatosis: Secondary | ICD-10-CM

## 2024-07-03 DIAGNOSIS — R52 Pain, unspecified: Secondary | ICD-10-CM

## 2024-07-03 DIAGNOSIS — M7751 Other enthesopathy of right foot: Secondary | ICD-10-CM

## 2024-07-03 MED ORDER — BETAMETHASONE SOD PHOS & ACET 6 (3-3) MG/ML IJ SUSP
3.0000 mg | Freq: Once | INTRAMUSCULAR | Status: AC
  Administered 2024-07-03: 3 mg via INTRA_ARTICULAR

## 2024-07-03 MED ORDER — METHYLPREDNISOLONE 4 MG PO TBPK: ORAL_TABLET | ORAL | 0 refills | Status: AC

## 2024-07-03 NOTE — Progress Notes (Signed)
   Chief Complaint  Patient presents with   Foot Pain    R heel pain x 1 week.  Had Plantar fasciitis 20 yrs ago and feels the same.  Not diabetic no anti coag    Subjective: 73 y.o. female presenting for new complaint of pain and tenderness associated to the right plantar heel ongoing for about 1 week.  Idiopathic onset.  Denies a history of injury.  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   Primary insomnia 11/16/2017   Seasonal allergic rhinitis due to pollen 03/29/2019    Past Surgical History:  Procedure Laterality Date   CHOLECYSTECTOMY     COLONOSCOPY     x2   NASAL SINUS SURGERY     THYROIDECTOMY Right 12/08/2021   Procedure: THYROID  LOBECTOMY;  Surgeon: Jesus Oliphant, MD;  Location: [facility] OR;  Service: ENT;  Laterality: Right;   UPPER GASTROINTESTINAL ENDOSCOPY      Allergies  Allergen Reactions   Aspirin Other (See Comments)    Asthma   Nsaids Shortness Of Breath   Penicillins Diarrhea    Told as a child   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  Dermatology: Skin is warm, dry and supple bilateral lower extremities. Negative for open lesions or macerations bilateral.   Vascular: Dorsalis Pedis and Posterior Tibial pulses palpable bilateral.  Capillary fill time is immediate to all  digits.  Neurological: Grossly intact via light touch  Musculoskeletal: Tenderness to palpation to the plantar aspect of the right heel along the plantar fascia. All other joints range of motion within normal limits bilateral. Strength 5/5 in all groups bilateral.   Radiographic exam RT foot 07/03/2024: Normal osseous mineralization. Joint spaces preserved. No fracture/dislocation/boney destruction. No other soft tissue abnormalities or radiopaque foreign bodies.   Assessment: 1. Plantar fasciitis right  Plan of Care:  -Patient evaluated. Xrays reviewed.   -Injection of 0.5cc Celestone  soluspan injected into the right plantar fascia  -Prescription for Medrol  Dosepak - No NSAIDs.  Patient allergic to to NSAIDs causing asthma -Advised against going barefoot.  Recommended supportive tennis shoes and sneakers -Recommend daily calf stretching, specifically calf stretches to alleviate posterior calf tightness -Return to clinic 4 weeks   Thresa EMERSON Sar, DPM [facility]  Dr. Thresa EMERSON Sar, DPM    [address] [place], [place] [address]                Office [phone]  Fax [phone]

## 2024-07-09 ENCOUNTER — Encounter: Payer: Self-pay | Admitting: Family Medicine

## 2024-07-10 ENCOUNTER — Other Ambulatory Visit: Payer: Self-pay

## 2024-07-10 ENCOUNTER — Ambulatory Visit: Admitting: Podiatry

## 2024-07-10 MED ORDER — CHOLESTYRAMINE 4 G PO PACK: 2.0000 g | PACK | Freq: Two times a day (BID) | ORAL | 11 refills | Status: AC | PRN

## 2024-07-13 ENCOUNTER — Other Ambulatory Visit (HOSPITAL_BASED_OUTPATIENT_CLINIC_OR_DEPARTMENT_OTHER): Payer: Self-pay | Admitting: Family

## 2024-07-18 ENCOUNTER — Other Ambulatory Visit: Payer: Self-pay | Admitting: Pharmacist

## 2024-07-18 ENCOUNTER — Encounter: Payer: Self-pay | Admitting: Pharmacist

## 2024-07-18 DIAGNOSIS — E782 Mixed hyperlipidemia: Secondary | ICD-10-CM

## 2024-07-18 NOTE — Progress Notes (Signed)
 Pharmacy Quality Measure Review  This patient is appearing on a report for being at risk of failing the adherence measure for cholesterol (statin) medications this calendar year.   Medication: rosuvastatin  10mg  Last fill date: 04/14/2024 for #180 = 90 day supply  Patient has 3 refills remaining on a prescription that was sent in 04/17/2024 for rosuvastatin  10mg  - directions were to take 2 tablets daily. Spoke with patient today. She tried taking rosuvastatin  10mg  - 2 tabs but she was not able to tolerate so she has been taking 2 tablets 3 times per week and 1 tablet all other days. Patient states she just had rosuvastatin  filled but [facility] Prior reports they last filled rosuvastatin  for #180 tablets on 04/21/2024. She likely does not need refill yet based on how she is taking rosuvastatin . Will request updated Rx from PCP with current dose.  Next appointment with PCP is 6 months.      Will collaborate with provider to update Rx.   Madelin Ray, PharmD Clinical Pharmacist [facility]  Population Health [phone]

## 2024-07-18 NOTE — Telephone Encounter (Signed)
 Patient was not able to tolerate rosuvastaitn 10mg  - take 2 tablets daily. She has been taking 2 tablets = 20mg  3 days per week and 1 tablet = 10mg  all other day.  Last Lipid panel is below and showed improvement. Dr Jodie recommended she continue current dose.  Updated Rx needed with correct directions. Forwarding request to PCP.   Lab Results  Component Value Date   CHOL 206 (H) 06/13/2024   HDL 70.00 06/13/2024   LDLCALC 104 (H) 06/13/2024   LDLDIRECT 108.0 12/02/2022   TRIG 160.0 (H) 06/13/2024   CHOLHDL 3 06/13/2024

## 2024-07-19 MED ORDER — ROSUVASTATIN CALCIUM 10 MG PO TABS: ORAL_TABLET | ORAL | 2 refills | Status: AC

## 2024-07-24 ENCOUNTER — Ambulatory Visit: Admitting: Podiatry

## 2024-07-24 ENCOUNTER — Encounter: Payer: Self-pay | Admitting: Podiatry

## 2024-07-24 VITALS — Ht 65.0 in | Wt 140.8 lb

## 2024-07-24 DIAGNOSIS — M722 Plantar fascial fibromatosis: Secondary | ICD-10-CM | POA: Diagnosis not present

## 2024-07-24 MED ORDER — BETAMETHASONE SOD PHOS & ACET 6 (3-3) MG/ML IJ SUSP
3.0000 mg | Freq: Once | INTRAMUSCULAR | Status: AC
  Administered 2024-07-24: 3 mg via INTRA_ARTICULAR

## 2024-07-24 NOTE — Progress Notes (Signed)
   Chief Complaint  Patient presents with   Plantar Fasciitis    Pt is here to f/u on right foot due plantar fasciitis, she states that the foot still hurts not as bad as before.     Subjective: 73 y.o. female presenting for follow-up evaluation of plantar fasciitis to the right heel.  Onset around beginning of September.  Mild improvement.  She completed the prednisone  pack.  She also has been wearing OOFOS slides around the house and good supportive Hoka's with power step insoles daily   Past Medical History:  Diagnosis Date   ACE-inhibitor cough 11/25/2020   Allergy    seasonal   Arthritis    Basal cell carcinoma of skin 02/29/2012   basal cell carcinoma on her left melolabial fold approximately 30 years  ago   Diverticulitis    Essential hypertension 03/29/2019   GERD (gastroesophageal reflux disease) 03/29/2019   Chronic, normal EGD. 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   Primary insomnia 11/16/2017   Seasonal allergic rhinitis due to pollen 03/29/2019    Past Surgical History:  Procedure Laterality Date   CHOLECYSTECTOMY     COLONOSCOPY     x2   NASAL SINUS SURGERY     THYROIDECTOMY Right 12/08/2021   Procedure: THYROID  LOBECTOMY;  Surgeon: Jesus Oliphant, MD;  Location: [facility] OR;  Service: ENT;  Laterality: Right;   UPPER GASTROINTESTINAL ENDOSCOPY      Allergies  Allergen Reactions   Aspirin Other (See Comments)    Asthma   Nsaids Shortness Of Breath   Penicillins Diarrhea    Told as a child   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  Dermatology: Skin is warm, dry and supple bilateral lower extremities. Negative for open lesions or macerations bilateral.   Vascular: Dorsalis Pedis and Posterior Tibial pulses  palpable bilateral.  Capillary fill time is immediate to all digits.  Neurological: Grossly intact via light touch  Musculoskeletal: There continues to be tenderness to palpation to the plantar aspect of the right heel along the plantar fascia. All other joints range of motion within normal limits bilateral. Strength 5/5 in all groups bilateral.   Radiographic exam RT foot 07/03/2024: Normal osseous mineralization. Joint spaces preserved. No fracture/dislocation/boney destruction. No other soft tissue abnormalities or radiopaque foreign bodies.   Assessment: 1. Plantar fasciitis right  Plan of Care:  -Patient evaluated.    -Injection of 0.5cc Celestone  soluspan injected into the right plantar fascia  - No NSAIDs.  Patient allergic to to NSAIDs causing asthma - Continue wearing OOFOS recovery slides as well as Hoka shoes with power step insoles daily - Continue daily calf stretching, specifically calf stretches to alleviate posterior calf tightness -Return to clinic 4 weeks   Thresa EMERSON Sar, DPM [facility]  Dr. Thresa EMERSON Sar, DPM    [address] [place], [place] [address]                Office [phone]  Fax [phone]

## 2024-08-07 ENCOUNTER — Ambulatory Visit (HOSPITAL_BASED_OUTPATIENT_CLINIC_OR_DEPARTMENT_OTHER): Admitting: Cardiology

## 2024-08-07 ENCOUNTER — Encounter (HOSPITAL_BASED_OUTPATIENT_CLINIC_OR_DEPARTMENT_OTHER): Payer: Self-pay | Admitting: Cardiology

## 2024-08-07 VITALS — BP 124/84 | HR 96 | Ht 65.0 in | Wt 141.4 lb

## 2024-08-07 DIAGNOSIS — Z7189 Other specified counseling: Secondary | ICD-10-CM

## 2024-08-07 DIAGNOSIS — R002 Palpitations: Secondary | ICD-10-CM

## 2024-08-07 DIAGNOSIS — Z8249 Family history of ischemic heart disease and other diseases of the circulatory system: Secondary | ICD-10-CM

## 2024-08-07 DIAGNOSIS — I10 Essential (primary) hypertension: Secondary | ICD-10-CM | POA: Diagnosis not present

## 2024-08-07 DIAGNOSIS — E782 Mixed hyperlipidemia: Secondary | ICD-10-CM

## 2024-08-07 NOTE — Progress Notes (Signed)
 EKG

## 2024-08-07 NOTE — Patient Instructions (Signed)
 Medication Instructions:  No changes *If you need a refill on your cardiac medications before your next appointment, please call your pharmacy*  Lab Work: none  Testing/Procedures: none  Follow-Up: At [facility], you and your health needs are our priority.  As part of our continuing mission to provide you with exceptional heart care, our providers are all part of one team.  This team includes your primary Cardiologist (physician) and Advanced Practice Providers or APPs (Physician Assistants and Nurse Practitioners) who all work together to provide you with the care you need, when you need it.  Your next appointment:   12 month(s)  Provider:   Shelda Bruckner, MD

## 2024-08-07 NOTE — Progress Notes (Signed)
 Cardiology Office Note:  .   Date:  08/07/2024  ID:  Tina Kelley, DOB [birth year], MRN [id number] PCP: Jodie Lavern CROME, MD  [facility] Providers Cardiologist:  Shelda Bruckner, MD {  History of Present Illness: .   Tina Kelley is a 73 y.o. female with a hx of hypertension, palpitations, premature FH heart disease, who is seen for follow-up today. She was initially seen 08/06/2021 as a new consult at the request of Jodie Lavern CROME, MD to establish care. She is concerned about her cardiovascular risk.   Family history: Father had 1st heart attack at 22 yo, 2nd heart attack, CABG, CHF, died at 74 yo. Paternal uncles died of heart attack and stroke. Paternal grandfather had a heart attack. Mother died of small-cell lung cancer. Maternal grandmother had pancreatic cancer.   Pertinent CV history: stress test 2018 ([place]) normal.  Today: Doing well overall. Was just gifted a puppy yesterday. Has had some palpitations, worse when she hasn't eaten in a while. Does have GERD, on pantoprazole  BID. Metoprolol  generally manages symptoms well.   Her cholesterol was high, had her statin increased but had back pain, now doing 20 mg rosuvastatin  three times/week and 10 mg the other 4 times/week. LDL on this was 104 in 05/2024 per [facility].  Diet is healthy. Stays active.  ROS: Denies chest pain, shortness of breath at rest or with normal exertion. No PND, orthopnea, LE edema or unexpected weight gain. No syncope. ROS otherwise negative except as noted.   Studies Reviewed: SABRA    EKG:  EKG Interpretation Date/Time:  Monday August 07 2024 10:12:03 EST Ventricular Rate:  82 PR Interval:  166 QRS Duration:  84 QT Interval:  364 QTC Calculation: 425 R Axis:   39  Text Interpretation: Normal sinus rhythm Nonspecific ST abnormality When compared with ECG of 12-Aug-2023 09:06, No significant change was found Confirmed by Bruckner Shelda [phone]) on 08/07/2024 10:41:26 AM     Physical Exam:   VS:  BP 124/84   Pulse 96   Ht 5' 5 (1.651 m)   Wt 141 lb 6.4 oz (64.1 kg)   SpO2 100%   BMI 23.53 kg/m    Wt Readings from Last 3 Encounters:  08/07/24 141 lb 6.4 oz (64.1 kg)  07/24/24 140 lb 12.8 oz (63.9 kg)  06/13/24 140 lb 12.8 oz (63.9 kg)    GEN: Well nourished, well developed in no acute distress HEENT: Normal, moist mucous membranes NECK: No JVD CARDIAC: regular rhythm, normal S1 and S2, no rubs or gallops. No murmur. VASCULAR: Radial and DP pulses 2+ bilaterally. No carotid bruits RESPIRATORY:  Clear to auscultation without rales, wheezing or rhonchi  ABDOMEN: Soft, non-tender, non-distended MUSCULOSKELETAL:  Ambulates independently SKIN: Warm and dry, no edema NEUROLOGIC:  Alert and oriented x 3. No focal neuro deficits noted. PSYCHIATRIC:  Normal affect    ASSESSMENT AND PLAN: .    Palpitations/fluttering -on metoprolol  succinate 50 mg daily, generally well controlled   Hyperlipidemia, mixed -now doing 20 mg rosuvastatin  three times/week and 10 mg the other 4 times/week. LDL on this was 104 in 05/2024 per [facility]. -cholestyramine  is for chronic diarrhea   Hypertension -continue losartan  50 mg daily   Family history of premature CV disease -calcium  score has been discussed, but as she is on a statin, this would not change management -reviewed red flag warning signs that need immediate medical attention   CV risk counseling and prevention -recommend heart healthy/Mediterranean diet, with whole grains, fruits,  vegetable, fish, lean meats, nuts, and olive oil. Limit salt. -recommend moderate walking, 3-5 times/week for 30-50 minutes each session. Aim for at least 150 minutes.week. Goal should be pace of 3 miles/hours, or walking 1.5 miles in 30 minutes -recommend avoidance of tobacco products. Avoid excess alcohol .  Dispo: 1 year or sooner as needed  Signed, Shelda Bruckner, MD   Shelda Bruckner, MD, PhD, [facility]  [facility]  HeartCare  [place]  Heart & Vascular at [facility] at [facility] [address] [place], [place] [address] [phone]

## 2024-08-23 ENCOUNTER — Other Ambulatory Visit: Payer: Self-pay | Admitting: Family Medicine

## 2024-08-24 ENCOUNTER — Encounter: Payer: Self-pay | Admitting: Family Medicine

## 2024-08-28 ENCOUNTER — Encounter: Payer: Self-pay | Admitting: Family Medicine

## 2024-08-28 ENCOUNTER — Ambulatory Visit: Admitting: Podiatry

## 2024-08-28 NOTE — Telephone Encounter (Signed)
 06/13/2024 LOV  02/29/2024 fill date   30/5 refills

## 2024-08-30 ENCOUNTER — Encounter: Payer: Self-pay | Admitting: Family Medicine

## 2024-08-30 MED ORDER — ZOLPIDEM TARTRATE 10 MG PO TABS: 10.0000 mg | ORAL_TABLET | Freq: Every evening | ORAL | 5 refills | Status: AC | PRN

## 2024-09-15 ENCOUNTER — Other Ambulatory Visit: Payer: Self-pay | Admitting: Family Medicine

## 2024-09-15 NOTE — Telephone Encounter (Signed)
 06/13/2024  09/20/2023  180/3 refills

## 2024-10-13 ENCOUNTER — Other Ambulatory Visit (HOSPITAL_BASED_OUTPATIENT_CLINIC_OR_DEPARTMENT_OTHER): Payer: Self-pay | Admitting: Family

## 2024-10-14 ENCOUNTER — Other Ambulatory Visit: Payer: Self-pay | Admitting: Internal Medicine

## 2024-10-18 NOTE — Progress Notes (Unsigned)
 "               Odis Mace D.CLEMENTEEN [facility] [address] [place] [address] Phone: [phone]   Assessment and Plan:     ***    Pertinent previous records reviewed include ***   Follow Up: ***     Subjective:   I, Tina Kelley, am serving as a neurosurgeon for Doctor Morene Mace   Chief Complaint: back spasm   HPI:    04/03/24 Patient states that her back was doing a lot better but on Monday she has been having an issue. Just woke up with the pain and did not do anything to it. Can't take aleve or anything like because she is allergic to it. Using topicals but it doesn't help that much. Having trouble with day to day activities. Pain can wake her up at night when she moves. Breathing in hard, sneezing, bending over are issues.    05/25/2024 Patient states she is in flare low back and shoulder from watching her grandchildren in [place]   10/19/2024 Patient states   Relevant Historical Information: Hypertension, GERD  Additional pertinent review of systems negative.  Current Medications[1]   Objective:     There were no vitals filed for this visit.    There is no height or weight on file to calculate BMI.    Physical Exam:    ***   Electronically signed by:  Odis Mace D.[facility] 7:20 AM 10/18/24    [1]  Current Outpatient Medications:    acetaminophen  (TYLENOL ) 500 MG tablet, Take 500 mg by mouth every 6 (six) hours as needed for moderate pain., Disp: , Rfl:    albuterol  (VENTOLIN  HFA) 108 (90 Base) MCG/ACT inhaler, INHALE 2 PUFFS INTO THE LUNGS EVERY 6 HOURS AS NEEDED FOR WHEEZING OR SHORTNESS OF BREATH, Disp: 18 g, Rfl: 1   buPROPion  (WELLBUTRIN  SR) 150 MG 12 hr tablet, TAKE 1 TABLET BY MOUTH 2 TIMES A DAY, Disp: 180 tablet, Rfl: 3   cholestyramine  (QUESTRAN ) 4 g packet, Take 0.5 packets (2 g total) by mouth 2 (two) times daily as needed., Disp: 60 each, Rfl: 11   clobetasol ointment (TEMOVATE) 0.05  %, Apply 1 Application topically 2 (two) times daily., Disp: , Rfl:    EPINEPHrine  (EPIPEN  2-PAK) 0.3 mg/0.3 mL IJ SOAJ injection, Inject 0.3 mg into the muscle as needed for anaphylaxis. Use as instructed., Disp: 1 each, Rfl: 1   hydrocortisone  (ANUSOL -HC) 25 MG suppository, Place 1 suppository (25 mg total) rectally 2 (two) times daily as needed for hemorrhoids or anal itching., Disp: 12 suppository, Rfl: 2   Lactobacillus Rhamnosus, GG, (CULTURELLE) CAPS, Take 1 capsule by mouth daily., Disp: , Rfl:    losartan  (COZAAR ) 50 MG tablet, TAKE A HALF TABLET BY MOUTH EVERY MORNING AND TAKE A HALF TABLET BY MOUTH EVERY NIGHT AT BEDTIME, Disp: 90 tablet, Rfl: 0   methylPREDNISolone  (MEDROL  DOSEPAK) 4 MG TBPK tablet, 6 day dose pack - take as directed, Disp: 21 tablet, Rfl: 0   metoprolol  succinate (TOPROL -XL) 50 MG 24 hr tablet, TAKE 1 TABLET BY MOUTH DAILY WITH OR IMMEDIATELY FOLLOWING A MEAL., Disp: 90 tablet, Rfl: 3   pantoprazole  (PROTONIX ) 40 MG tablet, Take 1 tablet (40 mg total) by mouth 2 (two) times daily. NEEDS APPT FOR FURTHER REFILLS, Disp: 180 tablet, Rfl: 0   Polyvinyl Alcohol -Povidone (REFRESH OP), Place 1 drop into both eyes daily as needed (dry eyes)., Disp: ,  Rfl:    RESTASIS 0.05 % ophthalmic emulsion, , Disp: , Rfl:    rosuvastatin  (CRESTOR ) 10 MG tablet, Take 2 tablets = 20mg  3 days per week; Take 1 tablet = 10mg  all other day., Disp: 135 tablet, Rfl: 2   sodium chloride  (OCEAN) 0.65 % SOLN nasal spray, Place 1 spray into both nostrils as needed for congestion., Disp: , Rfl:    zolpidem  (AMBIEN ) 10 MG tablet, Take 1 tablet (10 mg total) by mouth at bedtime as needed. for sleep, Disp: 30 tablet, Rfl: 5  "

## 2024-10-19 ENCOUNTER — Ambulatory Visit: Admitting: Sports Medicine

## 2024-11-06 ENCOUNTER — Ambulatory Visit: Admitting: Podiatry

## 2024-11-14 ENCOUNTER — Ambulatory Visit: Payer: Medicare PPO

## 2024-12-11 ENCOUNTER — Encounter: Admitting: Family Medicine

## 2025-06-11 ENCOUNTER — Encounter: Admitting: Family Medicine
